# Patient Record
Sex: Female | Born: 1960
Health system: Southern US, Community
[De-identification: ages and names within clinical notes are randomized; demographics above are authoritative.]

## PROBLEM LIST (undated history)

## (undated) DIAGNOSIS — N6452 Nipple discharge: Secondary | ICD-10-CM

## (undated) DIAGNOSIS — F410 Panic disorder [episodic paroxysmal anxiety] without agoraphobia: Secondary | ICD-10-CM

## (undated) DIAGNOSIS — J309 Allergic rhinitis, unspecified: Secondary | ICD-10-CM

## (undated) DIAGNOSIS — F411 Generalized anxiety disorder: Secondary | ICD-10-CM

## (undated) DIAGNOSIS — J45909 Unspecified asthma, uncomplicated: Secondary | ICD-10-CM

## (undated) DIAGNOSIS — R590 Localized enlarged lymph nodes: Secondary | ICD-10-CM

## (undated) DIAGNOSIS — F329 Major depressive disorder, single episode, unspecified: Secondary | ICD-10-CM

## (undated) DIAGNOSIS — F3289 Other specified depressive episodes: Secondary | ICD-10-CM

## (undated) DIAGNOSIS — M5136 Other intervertebral disc degeneration, lumbar region: Secondary | ICD-10-CM

## (undated) DIAGNOSIS — M51369 Other intervertebral disc degeneration, lumbar region without mention of lumbar back pain or lower extremity pain: Secondary | ICD-10-CM

## (undated) DIAGNOSIS — M545 Low back pain, unspecified: Secondary | ICD-10-CM

## (undated) DIAGNOSIS — E538 Deficiency of other specified B group vitamins: Secondary | ICD-10-CM

## (undated) DIAGNOSIS — R1310 Dysphagia, unspecified: Secondary | ICD-10-CM

## (undated) DIAGNOSIS — K219 Gastro-esophageal reflux disease without esophagitis: Secondary | ICD-10-CM

## (undated) DIAGNOSIS — L2089 Other atopic dermatitis: Secondary | ICD-10-CM

## (undated) DIAGNOSIS — E786 Lipoprotein deficiency: Secondary | ICD-10-CM

## (undated) DIAGNOSIS — R200 Anesthesia of skin: Secondary | ICD-10-CM

## (undated) DIAGNOSIS — Z823 Family history of stroke: Secondary | ICD-10-CM

## (undated) HISTORY — DX: Gastro-esophageal reflux disease without esophagitis: K21.9

## (undated) HISTORY — DX: Generalized anxiety disorder: F41.1

## (undated) HISTORY — DX: Dysphagia, unspecified: R13.10

## (undated) HISTORY — DX: Unspecified asthma, uncomplicated: J45.909

## (undated) HISTORY — DX: Other atopic dermatitis: L20.89

## (undated) HISTORY — DX: Low back pain: M54.5

## (undated) HISTORY — DX: Deficiency of other specified B group vitamins: E53.8

## (undated) HISTORY — DX: Other intervertebral disc degeneration, lumbar region: M51.36

## (undated) HISTORY — DX: Other specified depressive episodes: F32.89

## (undated) HISTORY — DX: Major depressive disorder, single episode, unspecified: F32.9

## (undated) HISTORY — DX: Allergic rhinitis, unspecified: J30.9

## (undated) HISTORY — DX: Low back pain, unspecified: M54.50

## (undated) HISTORY — DX: Panic disorder (episodic paroxysmal anxiety): F41.0

## (undated) HISTORY — DX: Other intervertebral disc degeneration, lumbar region without mention of lumbar back pain or lower extremity pain: M51.369

---
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
## 1982-05-07 HISTORY — PX: KNEE ARTHROSCOPY: SUR90

## 1998-12-13 ENCOUNTER — Encounter: Admission: RE | Admit: 1998-12-13 | Discharge: 1998-12-30 | Payer: Self-pay | Admitting: Family Medicine

## 1998-12-22 ENCOUNTER — Other Ambulatory Visit: Admission: RE | Admit: 1998-12-22 | Discharge: 1998-12-22 | Payer: Self-pay | Admitting: Obstetrics and Gynecology

## 1999-10-10 ENCOUNTER — Encounter: Payer: Self-pay | Admitting: Family Medicine

## 1999-10-10 ENCOUNTER — Encounter: Admission: RE | Admit: 1999-10-10 | Discharge: 1999-10-10 | Payer: Self-pay | Admitting: Family Medicine

## 2000-01-16 ENCOUNTER — Other Ambulatory Visit: Admission: RE | Admit: 2000-01-16 | Discharge: 2000-01-16 | Payer: Self-pay | Admitting: Obstetrics and Gynecology

## 2001-03-07 ENCOUNTER — Other Ambulatory Visit: Admission: RE | Admit: 2001-03-07 | Discharge: 2001-03-07 | Payer: Self-pay | Admitting: Obstetrics and Gynecology

## 2001-05-23 ENCOUNTER — Encounter: Admission: RE | Admit: 2001-05-23 | Discharge: 2001-07-17 | Payer: Self-pay | Admitting: Family Medicine

## 2002-06-29 ENCOUNTER — Other Ambulatory Visit: Admission: RE | Admit: 2002-06-29 | Discharge: 2002-06-29 | Payer: Self-pay | Admitting: Obstetrics and Gynecology

## 2003-10-22 ENCOUNTER — Other Ambulatory Visit: Admission: RE | Admit: 2003-10-22 | Discharge: 2003-10-22 | Payer: Self-pay | Admitting: Obstetrics and Gynecology

## 2003-11-05 ENCOUNTER — Encounter: Admission: RE | Admit: 2003-11-05 | Discharge: 2003-11-05 | Payer: Self-pay | Admitting: Family Medicine

## 2003-11-29 ENCOUNTER — Emergency Department (HOSPITAL_COMMUNITY): Admission: EM | Admit: 2003-11-29 | Discharge: 2003-11-29 | Payer: Self-pay | Admitting: Emergency Medicine

## 2004-02-05 ENCOUNTER — Encounter: Payer: Self-pay | Admitting: Family Medicine

## 2004-02-05 LAB — CONVERTED CEMR LAB

## 2004-04-11 ENCOUNTER — Ambulatory Visit: Payer: Self-pay | Admitting: Internal Medicine

## 2004-05-19 ENCOUNTER — Ambulatory Visit: Payer: Self-pay | Admitting: Family Medicine

## 2004-06-07 HISTORY — PX: OTHER SURGICAL HISTORY: SHX169

## 2004-07-27 ENCOUNTER — Ambulatory Visit: Payer: Self-pay | Admitting: Family Medicine

## 2004-08-25 ENCOUNTER — Ambulatory Visit: Payer: Self-pay | Admitting: Family Medicine

## 2005-05-04 ENCOUNTER — Other Ambulatory Visit: Admission: RE | Admit: 2005-05-04 | Discharge: 2005-05-04 | Payer: Self-pay | Admitting: Obstetrics and Gynecology

## 2005-09-04 HISTORY — PX: OTHER SURGICAL HISTORY: SHX169

## 2005-09-14 ENCOUNTER — Encounter: Admission: RE | Admit: 2005-09-14 | Discharge: 2005-09-14 | Payer: Self-pay | Admitting: Obstetrics and Gynecology

## 2005-09-18 ENCOUNTER — Ambulatory Visit: Payer: Self-pay | Admitting: Family Medicine

## 2005-09-25 ENCOUNTER — Ambulatory Visit: Payer: Self-pay | Admitting: Family Medicine

## 2005-09-28 ENCOUNTER — Ambulatory Visit: Payer: Self-pay | Admitting: Family Medicine

## 2005-10-12 ENCOUNTER — Ambulatory Visit: Payer: Self-pay | Admitting: Family Medicine

## 2005-10-22 ENCOUNTER — Ambulatory Visit: Payer: Self-pay | Admitting: Gastroenterology

## 2005-12-05 HISTORY — PX: GANGLION CYST EXCISION: SHX1691

## 2006-12-19 ENCOUNTER — Ambulatory Visit: Payer: Self-pay | Admitting: Family Medicine

## 2006-12-19 DIAGNOSIS — L2089 Other atopic dermatitis: Secondary | ICD-10-CM | POA: Insufficient documentation

## 2007-01-08 ENCOUNTER — Encounter: Payer: Self-pay | Admitting: Family Medicine

## 2007-01-08 DIAGNOSIS — J4520 Mild intermittent asthma, uncomplicated: Secondary | ICD-10-CM | POA: Insufficient documentation

## 2007-01-08 DIAGNOSIS — F329 Major depressive disorder, single episode, unspecified: Secondary | ICD-10-CM

## 2007-01-08 DIAGNOSIS — J309 Allergic rhinitis, unspecified: Secondary | ICD-10-CM | POA: Insufficient documentation

## 2007-01-08 DIAGNOSIS — F410 Panic disorder [episodic paroxysmal anxiety] without agoraphobia: Secondary | ICD-10-CM | POA: Insufficient documentation

## 2007-01-08 DIAGNOSIS — F3342 Major depressive disorder, recurrent, in full remission: Secondary | ICD-10-CM | POA: Insufficient documentation

## 2007-01-10 ENCOUNTER — Ambulatory Visit: Payer: Self-pay | Admitting: Family Medicine

## 2007-01-10 ENCOUNTER — Encounter (INDEPENDENT_AMBULATORY_CARE_PROVIDER_SITE_OTHER): Payer: Self-pay | Admitting: *Deleted

## 2007-06-12 ENCOUNTER — Encounter: Payer: Self-pay | Admitting: Family Medicine

## 2007-06-30 ENCOUNTER — Telehealth: Payer: Self-pay | Admitting: Family Medicine

## 2007-07-18 ENCOUNTER — Encounter: Payer: Self-pay | Admitting: Family Medicine

## 2007-08-07 ENCOUNTER — Ambulatory Visit: Payer: Self-pay | Admitting: Family Medicine

## 2008-02-19 ENCOUNTER — Telehealth: Payer: Self-pay | Admitting: Family Medicine

## 2008-06-24 ENCOUNTER — Ambulatory Visit: Payer: Self-pay | Admitting: Family Medicine

## 2008-07-05 ENCOUNTER — Telehealth (INDEPENDENT_AMBULATORY_CARE_PROVIDER_SITE_OTHER): Payer: Self-pay | Admitting: *Deleted

## 2008-08-05 LAB — CONVERTED CEMR LAB: Pap Smear: NORMAL

## 2008-11-01 ENCOUNTER — Telehealth: Payer: Self-pay | Admitting: Family Medicine

## 2008-11-15 ENCOUNTER — Telehealth: Payer: Self-pay | Admitting: Family Medicine

## 2008-11-16 ENCOUNTER — Ambulatory Visit: Payer: Self-pay | Admitting: Family Medicine

## 2008-12-22 ENCOUNTER — Ambulatory Visit: Payer: Self-pay | Admitting: Family Medicine

## 2008-12-22 DIAGNOSIS — F411 Generalized anxiety disorder: Secondary | ICD-10-CM

## 2008-12-22 HISTORY — DX: Generalized anxiety disorder: F41.1

## 2008-12-22 LAB — CONVERTED CEMR LAB: Cholesterol, target level: 200 mg/dL

## 2008-12-27 LAB — CONVERTED CEMR LAB
ALT: 14 units/L (ref 0–35)
AST: 17 units/L (ref 0–37)
Alkaline Phosphatase: 76 units/L (ref 39–117)
Eosinophils Relative: 0.2 % (ref 0.0–5.0)
GFR calc non Af Amer: 70.46 mL/min (ref >60)
HCT: 39.6 % (ref 36.0–46.0)
Hemoglobin: 13.3 g/dL (ref 12.0–15.0)
LDL Cholesterol: 128 mg/dL — ABNORMAL HIGH (ref 0–99)
Lymphocytes Relative: 20.4 % (ref 12.0–46.0)
Lymphs Abs: 2.2 10*3/uL (ref 0.7–4.0)
Monocytes Relative: 6.8 % (ref 3.0–12.0)
Neutro Abs: 7.8 10*3/uL — ABNORMAL HIGH (ref 1.4–7.7)
Platelets: 487 10*3/uL — ABNORMAL HIGH (ref 150.0–400.0)
Potassium: 4.9 meq/L (ref 3.5–5.1)
Sodium: 144 meq/L (ref 135–145)
Total Bilirubin: 0.5 mg/dL (ref 0.3–1.2)
VLDL: 23.8 mg/dL (ref 0.0–40.0)
WBC: 10.7 10*3/uL — ABNORMAL HIGH (ref 4.5–10.5)

## 2009-01-07 ENCOUNTER — Ambulatory Visit: Payer: Self-pay | Admitting: Family Medicine

## 2009-04-27 ENCOUNTER — Telehealth: Payer: Self-pay | Admitting: Family Medicine

## 2009-07-08 ENCOUNTER — Telehealth: Payer: Self-pay | Admitting: Family Medicine

## 2009-07-21 ENCOUNTER — Ambulatory Visit: Payer: Self-pay | Admitting: Family Medicine

## 2009-10-17 ENCOUNTER — Ambulatory Visit: Payer: Self-pay | Admitting: Family Medicine

## 2010-01-18 ENCOUNTER — Telehealth: Payer: Self-pay | Admitting: Family Medicine

## 2010-01-19 ENCOUNTER — Ambulatory Visit: Payer: Self-pay | Admitting: Family Medicine

## 2010-01-20 LAB — CONVERTED CEMR LAB
Basophils Absolute: 0 10*3/uL (ref 0.0–0.1)
Calcium: 8.8 mg/dL (ref 8.4–10.5)
Creatinine, Ser: 0.9 mg/dL (ref 0.4–1.2)
Eosinophils Absolute: 0.2 10*3/uL (ref 0.0–0.7)
GFR calc non Af Amer: 72 mL/min (ref >60)
HCT: 34.8 % — ABNORMAL LOW (ref 36.0–46.0)
Lymphs Abs: 2.4 10*3/uL (ref 0.7–4.0)
MCHC: 33.9 g/dL (ref 30.0–36.0)
Monocytes Absolute: 0.8 10*3/uL (ref 0.1–1.0)
Monocytes Relative: 8.6 % (ref 3.0–12.0)
Neutro Abs: 6.3 10*3/uL (ref 1.4–7.7)
Platelets: 390 10*3/uL (ref 150.0–400.0)
RDW: 14.1 % (ref 11.5–14.6)
Sodium: 140 meq/L (ref 135–145)
TSH: 1.59 microintl units/mL (ref 0.35–5.50)
Vitamin B-12: 172 pg/mL — ABNORMAL LOW (ref 211–911)

## 2010-01-23 ENCOUNTER — Encounter: Payer: Self-pay | Admitting: Family Medicine

## 2010-01-24 ENCOUNTER — Ambulatory Visit: Payer: Self-pay | Admitting: Family Medicine

## 2010-01-24 DIAGNOSIS — E538 Deficiency of other specified B group vitamins: Secondary | ICD-10-CM | POA: Insufficient documentation

## 2010-03-03 ENCOUNTER — Ambulatory Visit: Payer: Self-pay | Admitting: Family Medicine

## 2010-03-07 LAB — CONVERTED CEMR LAB
Basophils Absolute: 0.1 10*3/uL (ref 0.0–0.1)
Eosinophils Relative: 2.7 % (ref 0.0–5.0)
Lymphs Abs: 2.6 10*3/uL (ref 0.7–4.0)
Monocytes Relative: 8.1 % (ref 3.0–12.0)
Neutrophils Relative %: 63.3 % (ref 43.0–77.0)
Platelets: 401 10*3/uL — ABNORMAL HIGH (ref 150.0–400.0)
RDW: 13.9 % (ref 11.5–14.6)
Vitamin B-12: 369 pg/mL (ref 211–911)
WBC: 10.4 10*3/uL (ref 4.5–10.5)

## 2010-03-29 ENCOUNTER — Ambulatory Visit: Payer: Self-pay | Admitting: Family Medicine

## 2010-03-29 DIAGNOSIS — M545 Low back pain, unspecified: Secondary | ICD-10-CM | POA: Insufficient documentation

## 2010-04-13 ENCOUNTER — Encounter: Payer: Self-pay | Admitting: Family Medicine

## 2010-04-13 ENCOUNTER — Telehealth: Payer: Self-pay | Admitting: Family Medicine

## 2010-05-23 ENCOUNTER — Telehealth: Payer: Self-pay | Admitting: Family Medicine

## 2010-06-01 ENCOUNTER — Encounter: Payer: Self-pay | Admitting: Family Medicine

## 2010-06-06 NOTE — Progress Notes (Signed)
Summary: Alternative for Paxil  Phone Note From Pharmacy Call back at [phone]   Caller: [facility]* Call For: Dr. Milinda Antis  Summary of Call: Received fax from pharmacy asking to which patient form Paxil to Prozac.  States patient does not tolerate Paxil.  Please advise. Initial call taken by: Linde Gillis CMA Duncan Dull),  July 08, 2009 11:57 AM  Follow-up for Phone Call        that is fine with me - but please verify this with her by phone first  px written on EMR for call in- prozac f/u with me in 6-8 weeks please to check in  please note what side eff she has to paxil and note on her allergy list -thanks  if prozac causes depression or makes her suddenly depressed or suicidal -- stop it and update me   Follow-up by: Judith Part MD,  July 08, 2009 1:38 PM  Additional Follow-up for Phone Call Additional follow up Details #1::        Pt did not have side effects from Paxil, she liked the way Prozac made her feel instead of Paxil. Patient notified as instructed by telephone. Pt will call back to schedule appt in 6-8 weeks,.Medication phoned to [facility] pharmacy as instructed. Lewanda Rife LPN  July 09, 863 4:37 PM     New/Updated Medications: PROZAC 20 MG CAPS (FLUOXETINE HCL) 1 by mouth once daily Prescriptions: PROZAC 20 MG CAPS (FLUOXETINE HCL) 1 by mouth once daily  #30 x 11   Entered and Authorized by:   Judith Part MD   Signed by:   Lewanda Rife LPN on 78/46/9629   Method used:   Telephoned to ...       [facility]* (retail)       [address]       [place], [place]  [address]       Ph: [phone]       Fax: [phone]   RxID:   [id number]

## 2010-06-06 NOTE — Assessment & Plan Note (Signed)
Summary: BACK PAIN / LFW   Vital Signs:  Patient profile:   50 year old female Height:      64 inches Weight:      199.75 pounds BMI:     34.41 Temp:     99.2 degrees F oral Pulse rate:   88 / minute Pulse rhythm:   regular BP sitting:   136 / 72  (left arm) Cuff size:   large  Vitals Entered By: Lewanda Rife LPN (March 29, 2010 2:26 PM) CC: Lower back pain on and off for 2 months, 4 days ago pulled muscle in back when picked up bale of hay. Pain level now is 4.   History of Present Illness: pulled a muscle in her back 2 mo ago pulling up shrubs  nursed with heat and ice and ibuprofen  got better after 2 wks-- then hurt it again twice  recently picked up a bail of hay and worse again   pain is lower R  sharp and dull  worst to pull or sit up  best to get in hot bath or in heated seats  pain does not go down leg no numb or weakness  a little better today   started trying to do some crunches to improve abd strength -- ? if it is helping  Allergies: 1)  ! 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 CV:  Denies palpitations and shortness of breath with exertion. Resp:  Denies cough, shortness of breath, and wheezing. GI:  Denies abdominal pain. GU:  Denies dysuria, hematuria, and urinary  frequency. MS:  Complains of low back pain and stiffness; denies cramps and muscle weakness. Derm:  Denies itching, lesion(s), poor wound healing, and rash. Neuro:  Denies numbness, tingling, and weakness. Endo:  Denies cold intolerance and heat intolerance. Heme:  Denies abnormal bruising and bleeding.  Physical Exam  General:  overweight but generally well appearing  Mouth:  pharynx pink and moist.   Neck:  supple with full rom and no masses or thyromegally, no JVD or carotid bruit  Lungs:  Normal respiratory effort, chest expands symmetrically. Lungs are clear to auscultation, no crackles or wheezes. Heart:  Normal rate and regular rhythm. S1 and S2 normal without gallop, murmur, click, rub or other extra sounds. Abdomen:  no suprapubic tenderness or fullness felt  Msk:  no bony spinal tenderness tender peri lumbar musculature on R with SI tenderness neg slr nl hip rotation nl gait  flex 20 deg/ ext 5 deg lateral flex worse on R Extremities:  No clubbing, cyanosis, edema, or deformity noted with normal full range of motion of all joints.   Neurologic:  strength normal in all extremities, sensation intact to light touch, gait normal, and DTRs symmetrical and normal.   Skin:  Intact without suspicious lesions or rashes Inguinal Nodes:  No significant adenopathy Psych:  normal affect, talkative and pleasant  Impression & Recommendations:  Problem # 1:  BACK PAIN, LUMBAR (ICD-724.2) Assessment New suspect muscular with weak spinal muscles and repeditive spasm from strenuous activities ref PT  has muscle relaxer add mobic as needed  heat/ stretches update if not improved Her updated medication list for this problem includes:    Mobic 15 Mg Tabs (Meloxicam) .Marland Kitchen... 1 by mouth once daily with a meal as needed back pain  stop if gi upset  Orders: Physical Therapy Referral (PT) Prescription Created Electronically [phone])  Complete Medication List: 1)  Singulair 10 Mg Tabs  (Montelukast sodium) .... Take 1 tablet by mouth once a day 2)  Valtrex 500 Mg Tabs (Valacyclovir hcl) .... Take 1 tablet by mouth once a day 3)  Multivitamins Tabs (Multiple vitamin) .... Take 1 tablet by mouth once a day 4)  Calcium, Magnesium Zinc  .... Once daily 5)  Proventil Hfa 108 (90 Base) Mcg/act Aers (Albuterol sulfate) .... 2 puffs inhaled every 4 hours as needed wheeze 6)  Flonase 50 Mcg/act Susp (Fluticasone propionate) .... 2 sprays each nostril daily 7)  Alprazolam 0.5 Mg Tabs (Alprazolam) .... 1/2 to 1 by mouth up to two times a day as needed severe anxiety 8)  Fluoxetine Hcl 10 Mg Tabs (Fluoxetine hcl) .... 2 tabs by mouth daily. 9)  Lo Loestrin Fe 1 Mg-10 Mcg / 10 Mcg Tabs (Norethin-eth estrad-fe biphas) .... As directed 10)  Cyanocobalamin 1000 Mcg/ml Soln (Cyanocobalamin) .... Inject as directed 1 ml im   once weekly for 3 weeks , then once monthly 11)  Needles and Syringes For B12 Injection  .... To inject b12 im as directed once weekly for 3 weeks and then once monthly 12)  Mobic 15 Mg Tabs (Meloxicam) .Marland Kitchen.. 1 by mouth once daily with a meal as needed back pain  stop if gi upset  Patient Instructions: 1)  stop all otc pain meds  2)  try mobic daily with food - stop if stomach upset - I sent to your pharmacy  3)  use muscle relaxer at night  4)  heat and gentle stretching  5)  we will do PT referral at check out  6)  if no further improvement after that let me know  Prescriptions: MOBIC 15 MG TABS (MELOXICAM) 1 by mouth once daily with a meal as needed back pain  stop if GI upset  #30 x 1   Entered and Authorized by:   Judith Part MD   Signed by:   Judith Part MD on 03/29/2010   Method used:   Electronically to        [facility]* (retail)       [address]       [place], [place]  [address]       Ph: [phone]       Fax: [phone]   RxID:   [id number]    Orders Added: 1)  Physical Therapy Referral [PT] 2)  Prescription Created  Electronically [G8553] 3)  Est. Patient Level III [47425]    Current Allergies (reviewed today): ! PCN

## 2010-06-06 NOTE — Assessment & Plan Note (Signed)
Summary: FOLLOW UP, DISCUSS LABS   Vital Signs:  Patient profile:   50 year old female Height:      64 inches Weight:      196.50 pounds BMI:     33.85 Temp:     98.9 degrees F oral Pulse rate:   88 / minute Pulse rhythm:   regular BP sitting:   118 / 68  (left arm) Cuff size:   large  Vitals Entered By: Lewanda Rife LPN (January 24, 2010 8:12 AM) CC: follow-up visit to discuss labs   History of Present Illness: here for f/u of fatigue  is difficult to exercise due to this  stress level is very high in general  can sleep all day on the weekend   her gyn is putting her on a new pill even though she is possibly menopausal  stays hot all the time    wt is up 4 lb  bp is 118/68  hb is slt low at 11.8 -- was 13.3 last check B12 is low at 172 never had this before   other labs are nl   has nexium - hardly ever takes it  no hx of stomach problems  no fam hx of low B12 that she knows of   eats a pretty balanced diet / not a lot of red meat   has not had a period in 5 months   takes vit that has B12     Allergies: 1)  ! 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 Eyes:  Denies blurring and eye irritation. CV:  Denies chest pain or discomfort, palpitations, and  shortness of breath with exertion. Resp:  Denies cough, shortness of breath, and wheezing. GI:  Denies abdominal pain, change in bowel habits, and indigestion. MS:  Denies muscle aches and cramps. Derm:  Denies itching, lesion(s), poor wound healing, and rash. Neuro:  Denies numbness and tingling. Psych:  Complains of irritability; denies anxiety and depression. Endo:  Complains of heat intolerance; denies cold intolerance, excessive thirst, and excessive urination. Heme:  Denies abnormal bruising and bleeding.  Physical Exam  General:  Well-developed,well-nourished,in no acute distress; alert,appropriate and cooperative throughout examination Head:  normocephalic, atraumatic, and no abnormalities observed.  no sinus tenderness Eyes:  vision grossly intact, pupils equal, pupils round, and pupils reactive to light.   Mouth:  pharynx pink and moist.   Neck:  supple with full rom and no masses or thyromegally, no JVD or carotid bruit  Lungs:  Normal respiratory effort, chest expands symmetrically. Lungs are clear to auscultation, no crackles or wheezes. Heart:  Normal rate and regular rhythm. S1 and S2 normal without gallop, murmur, click, rub or other extra sounds. Abdomen:  Bowel sounds positive,abdomen soft and non-tender without masses, organomegaly or hernias noted. no renal bruits  Msk:  No deformity or scoliosis noted of thoracic or lumbar spine.  Extremities:  No clubbing, cyanosis, edema, or deformity noted with normal full range of motion of all joints.   Neurologic:  sensation intact to light touch, gait normal, and DTRs symmetrical and normal.   Skin:  Intact without suspicious lesions or rashes no pallor  Cervical Nodes:  No lymphadenopathy noted Inguinal Nodes:  No significant adenopathy Psych:  normal affect, talkative and pleasant    Impression & Recommendations:  Problem # 1:  VITAMIN B12 DEFICIENCY (ICD-266.2) Assessment New  new dx also with slt low hb  will start  one B12 shot weekly for 4 weeks and then one monthly disc balanced diet  re check lab in 5 weeks  Orders: Admin of Therapeutic Inj  intramuscular or subcutaneous (04540) Vit B12 1000 mcg (J3420)  Problem # 2:  FATIGUE (ICD-780.79) Assessment: New suspect from combination of above plus stress and early menopause disc this in detail recommend healthy diet and exercise reviewed labs in detail with pt today disc expectations for menopause  Complete Medication List: 1)  Singulair 10 Mg Tabs (Montelukast sodium) .... Take 1 tablet by mouth once a day 2)  Valtrex 500 Mg Tabs (Valacyclovir hcl) .... Take 1 tablet by mouth once a day 3)  Multivitamins Tabs (Multiple vitamin) .... Take 1 tablet by mouth once a day 4)  Calcium, Magnesium Zinc  .... Once daily 5)  Proventil Hfa 108 (90 Base) Mcg/act Aers (Albuterol sulfate) .... 2 puffs inhaled every 4 hours as needed wheeze 6)  Flonase 50 Mcg/act Susp (Fluticasone propionate) .... 2 sprays each nostril daily 7)  Alprazolam 0.5 Mg Tabs (Alprazolam) .... 1/2 to 1 by mouth up to two times a day as needed severe anxiety 8)  Fluoxetine Hcl 10 Mg Tabs (Fluoxetine hcl) .... 2 tabs by mouth daily. 9)  Lo Loestrin Fe 1 Mg-10 Mcg / 10 Mcg Tabs (Norethin-eth estrad-fe biphas) .... As directed 10)  Cyanocobalamin 1000 Mcg/ml Soln (Cyanocobalamin) .... Inject as directed 1 ml im   once weekly for 3 weeks , then once monthly 11)  Needles and Syringes For B12 Injection  .... To inject b12 im as directed once weekly for 3 weeks and then once monthly  Patient Instructions: 1)  start B12 - one shot once weekly for 4 weeks  2)  first one today  3)  then take one shot per month  4)  schedule non fasting lab in 5 weeks -- cbc with diff and B12 level for anemia and fatigue  Prescriptions: CYANOCOBALAMIN 1000 MCG/ML SOLN (CYANOCOBALAMIN) inject as directed 1 ML IM   once weekly for 3 weeks , then once monthly  #4 ML x 11   Entered and Authorized by:   Judith Part MD   Signed by:   Judith Part MD on 01/24/2010   Method used:   Print then Give to Patient   RxID:   [id number] NEEDLES AND SYRINGES FOR B12 INJECTION to inject B12 IM as directed once weekly for 3 weeks and then once monthly  #4 x 11   Entered and Authorized by:   Judith Part MD   Signed by:   Judith Part MD on 01/24/2010   Method used:   Print then Give to Patient   RxID:   [id number]   Current Allergies (reviewed today): ! PCN   Medication Administration  Injection # 1:    Medication: Vit B12 1000 mcg    Diagnosis: VITAMIN B12 DEFICIENCY (ICD-266.2)    Route: IM  Site: L deltoid    Exp Date: 08/06/2011    Lot #: [id number]    Mfr: [employer] Regent    Patient tolerated injection without complications    Given by: Lewanda Rife LPN (January 24, 2010 11:32 AM)  Orders Added: 1)  Admin of Therapeutic Inj  intramuscular or subcutaneous [96372] 2)  Vit B12 1000 mcg [J3420] 3)  Est. Patient Level IV [16109]

## 2010-06-06 NOTE — Progress Notes (Signed)
Summary: regarding B12  Phone Note Call from Patient Call back at Home Phone [phone]   Caller: Patient Call For: Judith Part MD Summary of Call: Pt is asking if she can have her B12 shots every 3 weeks instead of 4.  She says she feels very tired and doesnt think that monthy injections will be enough for her.  She gave herself one yesterday, which was a week early. Initial call taken by: Lowella Petties CMA, AAMA,  April 13, 2010 11:36 AM  Follow-up for Phone Call        that is fine change to every 3 weeks and note it on med list Follow-up by: Judith Part MD,  April 13, 2010 1:07 PM  Additional Follow-up for Phone Call Additional follow up Details #1::        Patient notified as instructed by telephone. Med list updated.Lewanda Rife LPN  April 13, 2010 3:11 PM     +

## 2010-06-06 NOTE — Assessment & Plan Note (Signed)
Summary: EAR/CLE   Vital Signs:  Patient profile:   50 year old female Height:      64 inches Weight:      192.50 pounds BMI:     33.16 Temp:     99 degrees F oral Pulse rate:   84 / minute Pulse rhythm:   regular BP sitting:   122 / 74  (left arm) Cuff size:   large  Vitals Entered By: Delilah Shan CMA Duncan Dull) (July 21, 2009 10:07 AM) CC: Ear.  Needs refills on some meds:  Proventil, Flonase and needs Prozac tabs ( not capsules)   History of Present Illness: 50 yo here for ears popping. Had a URI a couple of weeks ago, symptoms of cough, nasal congestion have all resolved but still having bilateral ear pressure/popping. No pain, no fevers, no chills.  Had vertigo last week, symptoms have resolved.  Current Medications (verified): 1)  Singulair 10 Mg  Tabs (Montelukast Sodium) .... Take 1 Tablet By Mouth Once A Day 2)  Valtrex 500 Mg  Tabs (Valacyclovir Hcl) .... Take 1 Tablet By Mouth Once A Day 3)  Multivitamins   Tabs (Multiple Vitamin) .... Take 1 Tablet By Mouth Once A Day 4)  Calcium, Magnesium Zinc .... Once Daily 5)  Vitamin E 600 Unit  Caps (Vitamin E) .... Take 1 Capsule By Mouth Once A Day 6)  Proventil Hfa 108 (90 Base) Mcg/act  Aers (Albuterol Sulfate) .... 2 Puffs Inhaled Every 4 Hours As Needed Wheeze 7)  Flonase 50 Mcg/act Susp (Fluticasone Propionate) .... 2 Sprays Each Nostril Daily 8)  Yaz 3-0.02 Mg Tabs (Drospirenone-Ethinyl Estradiol) .... As Directed 9)  Alprazolam 0.5 Mg Tabs (Alprazolam) .... 1/2 To 1 By Mouth Up To Two Times A Day As Needed Severe Anxiety 10)  Fluoxetine Hcl 10 Mg Tabs (Fluoxetine Hcl) .... 2 Tabs By Mouth Daily.  Allergies: 1)  ! Pcn  Review of Systems      See HPI General:  Denies chills, fatigue, and fever. Eyes:  Denies blurring. ENT:  Complains of earache; denies ear discharge and sinus pressure. CV:  Denies chest pain or discomfort. Resp:  Denies cough.  Physical Exam  General:  overweight but generally well  appearing  Ears:  mod clear fluid TMs bilaterallhy Nose:  no nasal discharge.   Mouth:  pharynx pink and moist.   Lungs:  Normal respiratory effort, chest expands symmetrically. Lungs are clear to auscultation, no crackles or wheezes. Heart:  Normal rate and regular rhythm. S1 and S2 normal without gallop, murmur, click, rub or other extra sounds. Extremities:  No clubbing, cyanosis, edema, or deformity noted with normal full range of motion of all joints.   Psych:  normal affect, talkative and pleasant    Impression & Recommendations:  Problem # 1:  URI (ICD-465.9) Assessment New Resolving.  Advised trying Sudafed for pressure in ears. Continue NSAIDs as needed. RTC if no improvement 5-7 days.  Complete Medication List: 1)  Singulair 10 Mg Tabs (Montelukast sodium) .... Take 1 tablet by mouth once a day 2)  Valtrex 500 Mg Tabs (Valacyclovir hcl) .... Take 1 tablet by mouth once a day 3)  Multivitamins Tabs (Multiple vitamin) .... Take 1 tablet by mouth once a day 4)  Calcium, Magnesium Zinc  .... Once daily 5)  Vitamin E 600 Unit Caps (Vitamin e) .... Take 1 capsule by mouth once a day 6)  Proventil Hfa 108 (90 Base) Mcg/act Aers (Albuterol sulfate) .... 2 puffs inhaled every  4 hours as needed wheeze 7)  Flonase 50 Mcg/act Susp (Fluticasone propionate) .... 2 sprays each nostril daily 8)  Yaz 3-0.02 Mg Tabs (Drospirenone-ethinyl estradiol) .... As directed 9)  Alprazolam 0.5 Mg Tabs (Alprazolam) .... 1/2 to 1 by mouth up to two times a day as needed severe anxiety 10)  Fluoxetine Hcl 10 Mg Tabs (Fluoxetine hcl) .... 2 tabs by mouth daily. Prescriptions: PROVENTIL HFA 108 (90 BASE) MCG/ACT  AERS (ALBUTEROL SULFATE) 2 puffs inhaled every 4 hours as needed wheeze  #1 x 0   Entered and Authorized by:   Ruthe Mannan MD   Signed by:   Ruthe Mannan MD on 07/21/2009   Method used:   Electronically to        [facility]* (retail)       [address]       [place], [place]  [address]        Ph: [phone]       Fax: [phone]   RxID:   [id number] FLUOXETINE HCL 10 MG TABS (FLUOXETINE HCL) 2 tabs by mouth daily.  #60 x 3   Entered and Authorized by:   Ruthe Mannan MD   Signed by:   Ruthe Mannan MD on 07/21/2009   Method used:   Electronically to        [facility]* (retail)       [address]       [place], [place]  [address]       Ph: [phone]       Fax: [phone]   RxID:   [id number] FLONASE 50 MCG/ACT SUSP (FLUTICASONE PROPIONATE) 2 sprays each nostril daily  #1 x 6   Entered and Authorized by:   Ruthe Mannan MD   Signed by:   Ruthe Mannan MD on 07/21/2009   Method used:   Electronically to        [facility]* (retail)       [address]       [place], [place]  [address]       Ph: [phone]       Fax: [phone]   RxID:   [id number]   Current Allergies (reviewed today): ! PCN

## 2010-06-06 NOTE — Miscellaneous (Signed)
Summary: Substances Contract  Substances Contract   Imported By: Lester Ignacio 02/02/2010 10:23:57  _____________________________________________________________________  External Attachment:    Type:   Image     Comment:   External Document

## 2010-06-06 NOTE — Miscellaneous (Signed)
Summary: Evaluation and Orders/[facility]  Evaluation and Orders/[facility]   Imported By: Lester Rush 04/13/2010 10:53:50  _____________________________________________________________________  External Attachment:    Type:   Image     Comment:   External Document

## 2010-06-06 NOTE — Assessment & Plan Note (Signed)
Summary: EAR PAIN/DLO   Vital Signs:  Patient profile:   50 year old female Height:      64 inches Weight:      192.75 pounds BMI:     33.21 Temp:     98.8 degrees F oral Pulse rate:   84 / minute Pulse rhythm:   regular BP sitting:   110 / 70  (left arm) Cuff size:   large  Vitals Entered By: Lewanda Rife LPN (October 17, 2009 11:46 AM) CC: Rt ear pain still and rt wrist is bothering her   History of Present Illness: Rt ear pain -- came in and saw Dr Dayton Martes -- was inst to  take sudafed and she tried that  is on flonase and singulair for all  feels better when she taking sudafed -- but she does not want to take it forever some pressure feeling -- with popping , not really painful   does use flonase just one spray per nostril  R wrist had a knot in it - on inside and she ruptured? it -- getting out of tub - pushed to get up and forcibly extending wrist  does not hurt when still , but pain when she ext it    needs hydrocortisone cream as needed for poison ivy- gets it a lot  recently got on R arm after mowing some spots are still lower on arm - drying out  more itching over upper arm and elbow crease no pus or signs of infx    Allergies: 1)  ! Pcn  Past History:  Past Medical History: Last updated: 01/07/2009 Allergic rhinitis Asthma Depression   gyn- Dr Henderson Cloud   Past Surgical History: Last updated: 01/08/2007 arthroscopic knee surgry- evulsion of femur s/ p fall (1984) Caesarean section Dexa- (06/2004) Abd [place]- neg (09/2005) Ganglion cyst- knee (12/2005)  Family History: Last updated: 01/07/2009 Father: mild HTN, ulcerative colitis Mother:  Siblings:   Social History: Last updated: 01/07/2009 Marital Status: Married Children: 2 sons Occupation: Sales executive non smoker  does exercise  rare alcohol   Risk Factors: Smoking Status: never (01/08/2007)  Review of Systems General:  Denies chills, fatigue, fever, and loss of appetite. Eyes:   Complains of eye irritation. ENT:  Complains of postnasal drainage; denies decreased hearing, ear discharge, ringing in ears, and sinus pressure. CV:  Denies chest pain or discomfort and palpitations. Resp:  Denies cough, shortness of breath, and wheezing. GI:  Denies indigestion and nausea. Derm:  Complains of itching and rash; denies lesion(s) and poor wound healing. Neuro:  Denies headaches.  Physical Exam  General:  Well-developed,well-nourished,in no acute distress; alert,appropriate and cooperative throughout examination Head:  normocephalic, atraumatic, and no abnormalities observed.  no sinus tenderness Eyes:  vision grossly intact, pupils equal, pupils round, pupils reactive to light, and no injection.   Ears:  R ear normal and L ear normal.  - no eff today Nose:  nares are mildly congested Mouth:  pharynx pink and moist, no erythema, and no exudates.   Neck:  supple with full rom and no masses or thyromegally, no JVD or carotid bruit  Lungs:  Normal respiratory effort, chest expands symmetrically. Lungs are clear to auscultation, no crackles or wheezes. Heart:  Normal rate and regular rhythm. S1 and S2 normal without gallop, murmur, click, rub or other extra sounds. Msk:  tender over medial R wrist -- no lump felt  nl rom - pain on full extension nl perf and sensation nl grip and strength  Extremities:  No clubbing, cyanosis, edema, or deformity noted with normal full range of motion of all joints.   Neurologic:  sensation intact to light touch, gait normal, and DTRs symmetrical and normal.   Skin:  healed poison ivy vesicles - diffusely on R arm  Cervical Nodes:  No lymphadenopathy noted Psych:  normal affect, talkative and pleasant    Impression & Recommendations:  Problem # 1:  WRIST PAIN, RIGHT (ICD-719.43) Assessment New suspect pt self-ruptured a ganglion cyst in wrist - now is sore  recommend ice two times a day and soft splint for support update if not imp in 2  weeks   Problem # 2:  EAR PAIN, RIGHT (ICD-388.70) Assessment: Deteriorated suspect this is allergy and ET related - though better today adv to inc flonase to 2 sprays per nostril and update  sudafed as needed  if not imp consider ENt ref   Problem # 3:  DERMATITIS, OTHER ATOPIC (ICD-691.8)  recurrent poison ivy refil elocon disc avoidance update if not imp Her updated medication list for this problem includes:    Elocon 0.1 % Crea (Mometasone furoate) .Marland Kitchen... Apply to affected areas (poison ivy) once daily as needed  Orders: Prescription Created Electronically [id number])  Complete Medication List: 1)  Singulair 10 Mg Tabs (Montelukast sodium) .... Take 1 tablet by mouth once a day 2)  Valtrex 500 Mg Tabs (Valacyclovir hcl) .... Take 1 tablet by mouth once a day 3)  Multivitamins Tabs (Multiple vitamin) .... Take 1 tablet by mouth once a day 4)  Calcium, Magnesium Zinc  .... Once daily 5)  Vitamin E 600 Unit Caps (Vitamin e) .... Take 1 capsule by mouth once a day 6)  Proventil Hfa 108 (90 Base) Mcg/act Aers (Albuterol sulfate) .... 2 puffs inhaled every 4 hours as needed wheeze 7)  Flonase 50 Mcg/act Susp (Fluticasone propionate) .... 2 sprays each nostril daily 8)  Yaz 3-0.02 Mg Tabs (Drospirenone-ethinyl estradiol) .... As directed 9)  Alprazolam 0.5 Mg Tabs (Alprazolam) .... 1/2 to 1 by mouth up to two times a day as needed severe anxiety 10)  Fluoxetine Hcl 10 Mg Tabs (Fluoxetine hcl) .... 2 tabs by mouth daily. 11)  Sudafed 30 Mg Tabs (Pseudoephedrine hcl) .... Otc as directed. 12)  Elocon 0.1 % Crea (Mometasone furoate) .... Apply to affected areas (poison ivy) once daily as needed  Patient Instructions: 1)  increase your flonase to 2 sprays in each nostril once daily  2)  (for first few days can do it two times a day )  3)  sudafed as needed  4)  if not improved in 2 weeks - let me know  5)  use ice on wrist two times a day for 5 minutes  6)  use a soft wrist splint over  the counter for about 2 weeks also  7)  will refil your cortisone cream for poison ivy - I [place] [facility]  Prescriptions: ELOCON 0.1 % CREA (MOMETASONE FUROATE) apply to affected areas (poison ivy) once daily as needed  #1 medium x 1   Entered and Authorized by:   Judith Part MD   Signed by:   Judith Part MD on 10/17/2009   Method used:   Electronically to        [facility]* (retail)       [address]       [place], [place]  [address]       Ph: [phone]  Fax: [phone]   RxID:   [id number]   Current Allergies (reviewed today): ! PCN

## 2010-06-06 NOTE — Miscellaneous (Signed)
Summary: med list update Cyanocobalamin 1000 mcg  Clinical Lists Changes  Medications: Changed medication from CYANOCOBALAMIN 1000 MCG/ML SOLN (CYANOCOBALAMIN) inject as directed 1 ML IM   once weekly for 3 weeks , then once monthly to CYANOCOBALAMIN 1000 MCG/ML SOLN (CYANOCOBALAMIN) inject as directed 1 ML IM   once every 3 weeks.     Current Allergies: ! PCN

## 2010-06-06 NOTE — Progress Notes (Signed)
Summary: pt wants labwork  Phone Note Call from Patient Call back at Home Phone [phone]   Caller: Patient Call For: Judith Part MD Summary of Call: Pt is requesting to have blood work done, she is concerned about anemia due to fatigue. Initial call taken by: Lowella Petties CMA,  January 18, 2010 11:55 AM  Follow-up for Phone Call        if she is fatigued I would also like to check some more labs incl sugar/ thyroid / B12 if agreeable sched lab for fatigue - cbc with diff/ B12/ folate/ tsh/ bmp and then f/u to discuss and review symptoms Follow-up by: Judith Part MD,  January 18, 2010 12:13 PM  Additional Follow-up for Phone Call Additional follow up Details #1::        Left message for patient to call back. Lewanda Rife LPN  January 18, 2010 2:28 PM   Lab appt scheduled for tomorrow.       Lowella Petties CMA  January 18, 2010 2:40 PM

## 2010-06-08 NOTE — Progress Notes (Signed)
Summary: refill request for B12  Phone Note Refill Request Message from:  Fax from Pharmacy  Refills Requested: Medication #1:  CYANOCOBALAMIN 1000 MCG/ML SOLN inject as directed 1 ML IM   once every 3 weeks.   Last Refilled: 07/05/2008 Faxed request from [facility].  Initial call taken by: Lowella Petties CMA, AAMA,  May 23, 2010 9:44 AM  Follow-up for Phone Call        px written on EMR for call in  Follow-up by: Judith Part MD,  May 23, 2010 11:25 AM    Prescriptions: CYANOCOBALAMIN 1000 MCG/ML SOLN (CYANOCOBALAMIN) inject as directed 1 ML IM   once every 3 weeks.  #3 months x 3   Entered by:   Benny Lennert CMA (AAMA)   Authorized by:   Judith Part MD   Signed by:   Benny Lennert CMA (AAMA) on 05/23/2010   Method used:   Electronically to        [facility]* (retail)       [address]       [place], [place]  [address]       Ph: [phone]       Fax: [phone]   RxID:   [id number]

## 2010-06-20 ENCOUNTER — Telehealth: Payer: Self-pay | Admitting: Family Medicine

## 2010-06-22 NOTE — Letter (Signed)
Summary: Vanessa Harvey Progress note  Vanessa Harvey Progress note   Imported By: Kassie Mends 06/09/2010 10:54:26  _____________________________________________________________________  External Attachment:    Type:   Image     Comment:   External Document

## 2010-06-23 ENCOUNTER — Encounter: Payer: Self-pay | Admitting: Family Medicine

## 2010-06-28 NOTE — Progress Notes (Signed)
Summary: Rx Flexeril  Phone Note Refill Request Call back at Home Phone [phone] Message from:  Fax from Pharmacy  Refills Requested: Medication #1:  flexeril 10 mg   Last Refilled: 07/05/2008 Faxed request from [facility].  This is not on med list. Patient states that this is for back pain that she came in to see you about. Patient states that she has been having PT. Patient states that she has been to see Dr. Margaretha Sheffield at [facility] and she may have a herniated disk. She is to see Dr. Margaretha Sheffield again this Friday and needs to have something to relieve the muscles from tightning up on her.  Initial call taken by: Sydell Axon LPN,  June 20, 2010 1:37 PM  Follow-up for Phone Call        px written on EMR for call in that is fine caution of sedation Follow-up by: Judith Part MD,  June 20, 2010 2:02 PM  Additional Follow-up for Phone Call Additional follow up Details #1::        Medication phoned to [facility]  pharmacy as instructed. Patient notified as instructed by telephone. Lewanda Rife LPN  June 20, 2010 3:22 PM     New/Updated Medications: FLEXERIL 10 MG TABS (CYCLOBENZAPRINE HCL) 1 by mouth up to three times a day as needed back pain  caution of sedation Prescriptions: FLEXERIL 10 MG TABS (CYCLOBENZAPRINE HCL) 1 by mouth up to three times a day as needed back pain  caution of sedation  #30 x 1   Entered and Authorized by:   Judith Part MD   Signed by:   Judith Part MD on 06/20/2010   Method used:   Telephoned to ...       [facility]* (retail)       [address]       [place], [place]  [address]       Ph: [phone]       Fax: [phone]   RxID:   [id number]

## 2010-07-12 ENCOUNTER — Other Ambulatory Visit: Payer: Self-pay | Admitting: Sports Medicine

## 2010-07-12 DIAGNOSIS — M545 Low back pain, unspecified: Secondary | ICD-10-CM

## 2010-07-13 ENCOUNTER — Ambulatory Visit
Admission: RE | Admit: 2010-07-13 | Discharge: 2010-07-13 | Disposition: A | Discharge status: Home / Self Care | Payer: 59 | Source: Ambulatory Visit | Attending: Sports Medicine | Admitting: Sports Medicine

## 2010-07-13 DIAGNOSIS — M545 Low back pain, unspecified: Secondary | ICD-10-CM

## 2010-07-13 NOTE — Letter (Signed)
Summary: [facility]  [facility]   Imported By: Sherian Rein 07/03/2010 10:23:52  _____________________________________________________________________  External Attachment:    Type:   Image     Comment:   External Document

## 2010-07-14 ENCOUNTER — Encounter: Payer: Self-pay | Admitting: Family Medicine

## 2010-07-25 NOTE — Letter (Signed)
Summary: [facility]  [facility]   Imported By: Lanelle Bal 07/20/2010 13:57:02  _____________________________________________________________________  External Attachment:    Type:   Image     Comment:   External Document

## 2010-08-18 ENCOUNTER — Other Ambulatory Visit: Payer: Self-pay | Admitting: Family Medicine

## 2010-08-22 ENCOUNTER — Other Ambulatory Visit: Payer: Self-pay | Admitting: *Deleted

## 2010-08-22 MED ORDER — MELOXICAM 15 MG PO TABS: ORAL_TABLET | ORAL | Status: DC

## 2010-08-22 NOTE — Telephone Encounter (Signed)
Medication phoned to [facility] pharmacy as instructed.

## 2010-08-22 NOTE — Telephone Encounter (Signed)
Px written for call in   

## 2010-09-07 ENCOUNTER — Encounter: Payer: Self-pay | Admitting: Family Medicine

## 2010-09-08 ENCOUNTER — Encounter: Payer: Self-pay | Admitting: Family Medicine

## 2010-09-08 ENCOUNTER — Ambulatory Visit (INDEPENDENT_AMBULATORY_CARE_PROVIDER_SITE_OTHER): Payer: 59 | Admitting: Family Medicine

## 2010-09-08 DIAGNOSIS — K219 Gastro-esophageal reflux disease without esophagitis: Secondary | ICD-10-CM | POA: Insufficient documentation

## 2010-09-08 DIAGNOSIS — M545 Low back pain, unspecified: Secondary | ICD-10-CM

## 2010-09-08 DIAGNOSIS — R1310 Dysphagia, unspecified: Secondary | ICD-10-CM | POA: Insufficient documentation

## 2010-09-08 MED ORDER — CYCLOBENZAPRINE HCL 10 MG PO TABS: 10.0000 mg | ORAL_TABLET | Freq: Three times a day (TID) | ORAL | Status: DC | PRN

## 2010-09-08 MED ORDER — FLUTICASONE PROPIONATE 50 MCG/ACT NA SUSP: 2.0000 | Freq: Every day | NASAL | Status: DC

## 2010-09-08 MED ORDER — ESOMEPRAZOLE MAGNESIUM 40 MG PO CPDR: 40.0000 mg | DELAYED_RELEASE_CAPSULE | Freq: Every day | ORAL | Status: DC

## 2010-09-08 NOTE — Patient Instructions (Signed)
Stop all anti inflammatories Call Dr Margaretha Sheffield if your back pain gets worse  Continue PT exercises at home Avoid spicy food/ acidic food/ alcohol/ caffiene  Start nexium  If your swallowing problem is not better in 2weeks - please call  Follow up with me in 3 months

## 2010-09-08 NOTE — Progress Notes (Signed)
Subjective:    Patient ID: Vanessa Harvey, female    DOB: 1961-04-13, 50 y.o.   MRN: [id number]  HPI Here for problems swallowing pills When she takes capsules - gets stuck in her esophagus   In past took nexium for Margorie John that years ago and it worked well   She started some nexium (very old ) and also aloe over the counter Father has esophageal cancer   At night if she lies on her R side -- acid always comes up  Never really had heartburn   Also on diclofenac for 3 months --twice daily  For back pain - deg disc dz --in PT for that  [facility] will be able to get off that soon  She takes muscle relaxer at night  Hands feel swollen- ? From nsaid    Stress is also an issue  This make her more acidic as well    Past Medical History  Diagnosis Date  . Allergic rhinitis, cause unspecified   . Anxiety state, unspecified   . Unspecified asthma   . Lumbago   . Depressive disorder, not elsewhere classified   . Other atopic dermatitis and related conditions   . Panic disorder without agoraphobia   . Other B-complex deficiencies   . Degenerative disc disease, lumbar     Past Surgical History  Procedure Date  . Knee arthroscopy 1984    evulsion of femur s/p fall  . Cesarean section   . Dexa 06/2004  . Abd [place] 09/2005    Negative  . Ganglion cyst excision 12/2005    knee    History   Social History  . Marital Status: Married    Spouse Name: N/A    Number of Children: 2  . Years of Education: N/A   Occupational History  . Dental Assistant    Social History Main Topics  . Smoking status: Never Smoker   . Smokeless tobacco: Not on file  . Alcohol Use: Yes     Rare  . Drug Use: Not on file  . Sexually Active: Not on file   Other Topics Concern  . Not on file   Social History Narrative   Exercise-yes     Wt is up 8 lb       Review of Systems Review of Systems  Constitutional: Negative for fever, appetite change, unexpected weight change. , pos for  fatigue  Eyes: Negative for pain and visual disturbance.  Respiratory: Negative for cough and shortness of breath.   Cardiovascular: Negative.  - no cp or sob Gastrointestinal: Negative for nausea, diarrhea and constipation. pos for occas epigastric pain  Genitourinary: Negative for urgency and frequency.  Skin: Negative for pallor.  Neurological: Negative for weakness, light-headedness, numbness and headaches.  Hematological: Negative for adenopathy. Does not bruise/bleed easily.  Psychiatric/Behavioral: Negative for dysphoric mood. The patient is not nervous/anxious.  -- but much stress        Objective:   Physical Exam  Constitutional: She appears well-developed and well-nourished. No distress.       overwt and well appearing   HENT:  Head: Normocephalic and atraumatic.  Mouth/Throat: Oropharynx is clear and moist.  Eyes: Conjunctivae and EOM are normal. Pupils are equal, round, and reactive to light.  Neck: Normal range of motion. Neck supple. No JVD present. No thyromegaly present.  Cardiovascular: Normal rate, regular rhythm and normal heart sounds.   Pulmonary/Chest: Breath sounds normal. She has no wheezes.  Abdominal: Soft. Bowel sounds are  normal. She exhibits no distension and no mass. There is tenderness. There is no rebound and no guarding.       Mild epigastric tenderness   Musculoskeletal: She exhibits no edema and no tenderness.  Lymphadenopathy:    She has no cervical adenopathy.  Neurological: She is alert. She has normal reflexes.  Skin: Skin is warm and dry. No pallor.  Psychiatric: She has a normal mood and affect.          Assessment & Plan:

## 2010-09-08 NOTE — Assessment & Plan Note (Signed)
With deg lumbar disc Has to stop nsaid due to GI issues- she will need to let ortho know if worse ? Candidate for epid injection Urged to continue PT exercises and wt loss

## 2010-09-08 NOTE — Assessment & Plan Note (Signed)
Suspect from worsened gerd  Wt gain/ stress/ and nsaids may play a role  Will stop diclofenac  Start nexium  Diet disc in detail  Update in 2 wk if not imp needs ref for EGD F/u 3 mo

## 2010-09-08 NOTE — Assessment & Plan Note (Signed)
See eval for dysphagia Start nexium Stop nsaid Diet disc  F/u 3 mo

## 2010-09-12 ENCOUNTER — Telehealth: Payer: Self-pay | Admitting: *Deleted

## 2010-09-12 NOTE — Telephone Encounter (Signed)
Looks like her ins does no want to cover nexium - please ask her if she has tried Risk analyst, zegerid, protonix, aciphex, kapidex or dexilant and let me know (and if they did not work or side effects)  I will hold form

## 2010-09-12 NOTE — Telephone Encounter (Signed)
Prior Berkley Harvey is needed for omeprazole, form is on your shelf.

## 2010-09-12 NOTE — Telephone Encounter (Signed)
Patient notified as instructed by telephone. Pt said this is strange because unless her husband dropped off her prescriptions she has not taken prescriptions to pharmacy yet. Pt said when she gets home today she will ck to see if rxs are still there and call our office back. Pt said she has tried 2 or 3 different meds for this problem but does not remember the names or why she stopped taking.

## 2010-09-13 MED ORDER — PANTOPRAZOLE SODIUM 40 MG PO TBEC: 40.0000 mg | DELAYED_RELEASE_TABLET | Freq: Every day | ORAL | Status: DC

## 2010-09-13 NOTE — Telephone Encounter (Signed)
Patient notified as instructed by telephone. Medication phoned to [facility] pharmacy as instructed. Completed form faxed to [phone]. Copy of form at my desk if needed and form given to [facility].

## 2010-09-13 NOTE — Telephone Encounter (Signed)
Pt said she checked and her prescriptions are still at home so not sure why requesting prior auth. Pt said still cannot remember name of previous meds taken.

## 2010-09-13 NOTE — Telephone Encounter (Signed)
Will have to try something different then- lets try protonix Px written for call in   Let me know if not imp

## 2010-09-20 ENCOUNTER — Ambulatory Visit: Payer: 59 | Admitting: Family Medicine

## 2010-09-30 ENCOUNTER — Other Ambulatory Visit: Payer: Self-pay | Admitting: Family Medicine

## 2010-10-02 NOTE — Telephone Encounter (Signed)
Sent electronically 

## 2010-12-08 ENCOUNTER — Ambulatory Visit: Payer: 59 | Admitting: Family Medicine

## 2010-12-18 ENCOUNTER — Other Ambulatory Visit: Payer: Self-pay | Admitting: Family Medicine

## 2010-12-19 NOTE — Telephone Encounter (Signed)
Will refill electronically  

## 2010-12-19 NOTE — Telephone Encounter (Signed)
[facility] in [place] electronically request refill Cyanocobalamin 1000 mcg/ml.Please advise.

## 2011-01-16 ENCOUNTER — Telehealth: Payer: Self-pay | Admitting: *Deleted

## 2011-01-16 NOTE — Telephone Encounter (Signed)
Please ask pt if she wants [place] to try her on something else until I can re do the prior auth in nov and let me know  thanks

## 2011-01-16 NOTE — Telephone Encounter (Signed)
Fax from [facility] in [place] stating that nexium needs prior auth.  This was denied back in may and pt was changed to protonix, but pharmacy had written on fax that protonix doesn't help.  I called [employer] and was told that since it has been less than 6 months since last prior auth review an appeal would have to be made.  Pt is eligible for another review after 11/9.  Do you want to try her on something else until then.  I gave pharmacy all of this information and they are to let the pt know what is going on.

## 2011-01-17 NOTE — Telephone Encounter (Signed)
Left vm for pt to callback 

## 2011-01-18 NOTE — Telephone Encounter (Signed)
Left vm for pt to callback 

## 2011-01-25 NOTE — Telephone Encounter (Signed)
Patient notified as instructed by telephone. Pt is taking OTC med now and wants to wait until Nov to try for the Nexium. Pt will call back in Nov to ck on prior auth.

## 2011-03-16 ENCOUNTER — Ambulatory Visit (INDEPENDENT_AMBULATORY_CARE_PROVIDER_SITE_OTHER): Payer: 59 | Admitting: Family Medicine

## 2011-03-16 ENCOUNTER — Encounter: Payer: Self-pay | Admitting: Family Medicine

## 2011-03-16 VITALS — BP 128/74 | HR 76 | Temp 98.1°F | Ht 64.0 in | Wt 201.2 lb

## 2011-03-16 DIAGNOSIS — K219 Gastro-esophageal reflux disease without esophagitis: Secondary | ICD-10-CM

## 2011-03-16 DIAGNOSIS — R1310 Dysphagia, unspecified: Secondary | ICD-10-CM

## 2011-03-16 MED ORDER — ESOMEPRAZOLE MAGNESIUM 40 MG PO CPDR: 40.0000 mg | DELAYED_RELEASE_CAPSULE | Freq: Every day | ORAL | Status: DC

## 2011-03-16 NOTE — Progress Notes (Signed)
Subjective:    Patient ID: Vanessa Harvey, female    DOB: 1960-09-22, 50 y.o.   MRN: [id number]  HPI Here for pain when swallowing Would get better and then get worse  If she does not eat late/ watches diet/ takes her aloe - she is better Cared for her father - who died for esoph ca   Was here in May for gerd and dysphagia On protonix now  nexium took care of it back then   Can switch back to nexium now and would like to do that   Only pills get stuck  A lot of mucous - feeling in chest and throat   Has never seen a GI doctor in the past  No stomach pain  Does cough more -- ? If from gerd or allergies  She has gone to allergist in past   Patient Active Problem List  Diagnoses  . VITAMIN B12 DEFICIENCY  . ANXIETY  . PANIC DISORDER  . DEPRESSION  . ALLERGIC RHINITIS  . ASTHMA  . DERMATITIS, OTHER ATOPIC  . BACK PAIN, LUMBAR  . Dysphagia  . GERD (gastroesophageal reflux disease)   Past Medical History  Diagnosis Date  . Allergic rhinitis, cause unspecified   . Anxiety state, unspecified   . Unspecified asthma   . Lumbago   . Depressive disorder, not elsewhere classified   . Other atopic dermatitis and related conditions   . Panic disorder without agoraphobia   . Other B-complex deficiencies   . Degenerative disc disease, lumbar    Past Surgical History  Procedure Date  . Knee arthroscopy 1984    evulsion of femur s/p fall  . Cesarean section   . Dexa 06/2004  . Abd [place] 09/2005    Negative  . Ganglion cyst excision 12/2005    knee   History  Substance Use Topics  . Smoking status: Never Smoker   . Smokeless tobacco: Not on file  . Alcohol Use: Yes     Rare   Family History  Problem Relation Age of Onset  . Hypertension Father     Mild  . Ulcerative colitis Father    Allergies  Allergen Reactions  . Penicillins     REACTION: as child   Current Outpatient Prescriptions on File Prior to Visit  Medication Sig Dispense Refill  .  Calcium-Magnesium-Zinc 1000-400-15 MG TABS Take 1 tablet by mouth daily.        . cyanocobalamin (,VITAMIN B-12,) 1000 MCG/ML injection inject 1 milliliter intramuscularly EVERY 3 WEEKS AS DIRECTED  4 mL  11  . Flaxseed, Linseed, (FLAX SEED OIL PO) Take 1 capsule by mouth daily.        Marland Kitchen FLUoxetine (PROZAC) 10 MG tablet Take 20 mg by mouth daily.        . Multiple Vitamin (MULTIVITAMIN) tablet Take 1 tablet by mouth daily.        Marland Kitchen SINGULAIR 10 MG tablet take 1 tablet by mouth once daily  30 tablet  11  . albuterol (PROVENTIL HFA) 108 (90 BASE) MCG/ACT inhaler Inhale 2 puffs into the lungs every 4 (four) hours as needed.        . ALPRAZolam (XANAX) 0.5 MG tablet Take 0.25-0.5 mg by mouth 2 (two) times daily as needed. For severe anxiety       . fluticasone (FLONASE) 50 MCG/ACT nasal spray 2 sprays by Nasal route daily.  16 g  11  . norethindrone-ethinyl estradiol (JUNEL FE 1/20) 1-20 MG-MCG per tablet Take  1 tablet by mouth daily.        . valACYclovir (VALTREX) 500 MG tablet Take 500 mg by mouth daily.              Review of Systems Review of Systems  Constitutional: Negative for fever, appetite change, fatigue and unexpected weight change.  Eyes: Negative for pain and visual disturbance.  ENT pos for hoarseness/ cough / globus sensation Respiratory: pos for cough  and shortness of breath.   Cardiovascular: Negative for cp or palpitations    Gastrointestinal: Negative for nausea, diarrhea and constipation. pos for heartburn, regurgitation Genitourinary: Negative for urgency and frequency.  Skin: Negative for pallor or rash   Neurological: Negative for weakness, light-headedness, numbness and headaches.  Hematological: Negative for adenopathy. Does not bruise/bleed easily.  Psychiatric/Behavioral: Negative for dysphoric mood. The patient is not nervous/anxious.         Objective:   Physical Exam  Constitutional: She appears well-developed and well-nourished. No distress.  HENT:    Head: Normocephalic and atraumatic.  Mouth/Throat: Oropharynx is clear and moist.  Eyes: Conjunctivae and EOM are normal. Pupils are equal, round, and reactive to light. No scleral icterus.  Neck: Normal range of motion. Neck supple. No JVD present. Carotid bruit is not present. No thyromegaly present.  Cardiovascular: Normal rate and normal heart sounds.  Exam reveals no gallop.   Pulmonary/Chest: Effort normal and breath sounds normal. No respiratory distress. She has no wheezes.  Abdominal: Soft. Bowel sounds are normal. She exhibits no distension and no mass. There is no tenderness. There is no rebound and no guarding.  Lymphadenopathy:    She has no cervical adenopathy.  Neurological: She is alert.  Skin: Skin is warm and dry. No rash noted. No erythema. No pallor.  Psychiatric: She has a normal mood and affect.          Assessment & Plan:

## 2011-03-16 NOTE — Assessment & Plan Note (Signed)
PPI helps- but not better nexium works best - px sent to pharmacy for that Much stress Ref to GI for likely EGD

## 2011-03-16 NOTE — Assessment & Plan Note (Signed)
Slightly improved but not resolved- with hx of GERd (and esoph ca in family) Will go back to nexium if covered since this works better than protonix  Ref to Gi for further eval and likely endoscopy

## 2011-03-16 NOTE — Patient Instructions (Addendum)
We will do a GI referral at check out  Switch to nexium - I hope that helps - I sent it to your pharmacy  Watch your diet closely Ask about changing to the [place] office

## 2011-03-19 ENCOUNTER — Telehealth: Payer: Self-pay

## 2011-03-19 NOTE — Telephone Encounter (Signed)
Pt notified by pharmacy that Nexium would have to be preapproved. Jacki Cones received form from pharmacy this AM and will begin process for prior authorization. Pt said this happened about 5 years ago and pt tried Protonix, Prevacid and Omeprazole and all were ineffective. I checked and we do not have samples of Nexium also.

## 2011-03-20 ENCOUNTER — Telehealth: Payer: Self-pay | Admitting: *Deleted

## 2011-03-20 NOTE — Telephone Encounter (Signed)
Prior auth given for nexium, advised pharmacy, approval letter placed on doctor's desk for scanning.

## 2011-03-20 NOTE — Telephone Encounter (Signed)
Completed form faxed to [phone] as instructed. Form given to Lorenzo.

## 2011-03-20 NOTE — Telephone Encounter (Signed)
Prior Berkley Harvey is needed for nexium, form from [facility] is on your shelf.  Pt states she has tried protonix, prevacid and omeprazole- none of these worked for her.

## 2011-03-20 NOTE — Telephone Encounter (Signed)
Thanks- form done and in IN box

## 2011-04-18 ENCOUNTER — Other Ambulatory Visit: Payer: Self-pay | Admitting: Family Medicine

## 2011-04-18 NOTE — Telephone Encounter (Signed)
OK to refill? Last seen 11/12.

## 2011-04-18 NOTE — Telephone Encounter (Signed)
Will refill electronically  

## 2011-06-07 ENCOUNTER — Other Ambulatory Visit: Payer: Self-pay | Admitting: Family Medicine

## 2011-10-13 ENCOUNTER — Other Ambulatory Visit: Payer: Self-pay | Admitting: Family Medicine

## 2011-10-16 NOTE — Telephone Encounter (Signed)
Done

## 2011-12-04 ENCOUNTER — Telehealth: Payer: Self-pay | Admitting: *Deleted

## 2011-12-04 NOTE — Telephone Encounter (Signed)
Vanessa Harvey with [facility] said insurance has changed and now charging $45 copay for generic Singulair; wants to know if can change med to Accolape/generic Zasirlukast.Please advise.

## 2011-12-04 NOTE — Telephone Encounter (Signed)
Received a note from pharmacy stating that insurance charges

## 2011-12-04 NOTE — Telephone Encounter (Signed)
I would prefer this to come through Dr. Milinda Antis.  I will defer to her.

## 2011-12-05 NOTE — Telephone Encounter (Signed)
She can change to the generic form of singulair- please call pharmacy and let them know

## 2011-12-06 MED ORDER — ZAFIRLUKAST 20 MG PO TABS: 20.0000 mg | ORAL_TABLET | Freq: Two times a day (BID) | ORAL | Status: DC

## 2011-12-06 NOTE — Telephone Encounter (Signed)
We can change to accolate - it is 20 mg bid - let me know if any problems/ side eff or if it does not work Will refill electronically

## 2011-12-06 NOTE — Telephone Encounter (Signed)
Spoke with Raynelle Fanning from [facility] she stated that the insurance was charging $45 for the generic of Singular, She wants to know if Rx can be changed to Accolate because they only charge $10 for that one.

## 2011-12-06 NOTE — Telephone Encounter (Signed)
I sent new px for accolate to her pharmacy -- sorry, lost the note , let her know , update me if side eff or if not working

## 2011-12-06 NOTE — Telephone Encounter (Signed)
LMOVM of cell phone. 

## 2011-12-07 NOTE — Telephone Encounter (Signed)
pt left v/m that she only pays $15 for name brand Singulair and does not want generic. Spoke with Hospital doctor at [employer] and could be pts plan has changes mid year;copay for Singulair is $45. Left v/m for pt to call back.

## 2011-12-11 NOTE — Telephone Encounter (Signed)
Patient notified as instructed by telephone 12/06/11 1:01pm note. Pt said her insurance co will no longer pay anything on Singulair; Pt is willing to try generic accolate. Pt is aware she has allergies; pt wants to be tested for asthma.Please advise. Pt contact # [phone] may leave detailed message.

## 2011-12-11 NOTE — Telephone Encounter (Signed)
Patient advised.  She is going to get the name of an allergist in [place] that she would like to see and will call back with the name and contact information.

## 2011-12-11 NOTE — Telephone Encounter (Signed)
I sent accolate to her pharmacy on 8/1  If she wants testing for asthma I will refer her to an allergist ... Let me know if agreeable and I will do the ref

## 2012-01-01 NOTE — Telephone Encounter (Signed)
Pt called back today; she had gotten letter from [facility] that our office did not supply needed info for prior auth of Singulair. I reviewed with pt the notes below and she said she wants to try PA for Singulair. Pt does not want to take Acolate due to possible liver problems and other possible side effects. Pt also does not want to see allergist about asthma. PA for for Singulair is on Dr Royden Purl shelf.

## 2012-01-02 NOTE — Telephone Encounter (Signed)
Is she ok with the generic for singulair? -- the form indicates if not we need proof that she cannot take the generic If not - I will px the generic -- it is labeled as the covered alternative  Thanks I will hold the form on my desk

## 2012-01-02 NOTE — Telephone Encounter (Signed)
Left message on patient vm asking her to call and verify whether or not she want the generic singular.

## 2012-01-03 NOTE — Telephone Encounter (Signed)
Pt left v/m that [facility] told pt generic Singulair does not require PA and pt can pick up generic Singulair for $15 co pay; pt is OK with generic Singulair. Spoke with Stanton Kidney at [facility] she confirmed generic Singulair does not require PA and already as prescription for pt.

## 2012-01-21 ENCOUNTER — Other Ambulatory Visit: Payer: Self-pay

## 2012-01-21 NOTE — Telephone Encounter (Signed)
Pt left v/m requesting Proventil inhaler to [facility]. Please advise.

## 2012-01-21 NOTE — Telephone Encounter (Signed)
Please give her one inhaler with 3 refils, thanks

## 2012-01-22 MED ORDER — ALBUTEROL SULFATE HFA 108 (90 BASE) MCG/ACT IN AERS: 2.0000 | INHALATION_SPRAY | RESPIRATORY_TRACT | Status: DC | PRN

## 2012-01-28 ENCOUNTER — Encounter: Payer: Self-pay | Admitting: Family Medicine

## 2012-01-28 ENCOUNTER — Ambulatory Visit (INDEPENDENT_AMBULATORY_CARE_PROVIDER_SITE_OTHER): Payer: 59 | Admitting: Family Medicine

## 2012-01-28 VITALS — BP 154/84 | HR 80 | Temp 98.4°F | Ht 65.0 in | Wt 202.0 lb

## 2012-01-28 DIAGNOSIS — J069 Acute upper respiratory infection, unspecified: Secondary | ICD-10-CM | POA: Insufficient documentation

## 2012-01-28 MED ORDER — GUAIFENESIN-CODEINE 100-10 MG/5ML PO SYRP: 5.0000 mL | ORAL_SOLUTION | Freq: Every evening | ORAL | Status: DC | PRN

## 2012-01-28 MED ORDER — BENZONATATE 100 MG PO CAPS: 100.0000 mg | ORAL_CAPSULE | Freq: Three times a day (TID) | ORAL | Status: DC | PRN

## 2012-01-28 NOTE — Progress Notes (Signed)
Subjective:    Patient ID: Vanessa Harvey, female    DOB: [birth year], 51 y.o.   MRN: [id number]  HPI Here for uri Had uri for 2 weeks- head and chest cong with low grade fever That is improved - but cough is persistant  Coughed so much- has aggrivating her reflux  Some hoarseness also  Not wheezing- just cough Cannot cough anything up Albuterol does help the cough  Also is allergy season   Taking mucinex - plain guifen Sudafed 12 hour decongestant day only (last time she took it sat- )  Patient Active Problem List  Diagnosis  . VITAMIN B12 DEFICIENCY  . ANXIETY  . PANIC DISORDER  . DEPRESSION  . ALLERGIC RHINITIS  . ASTHMA  . DERMATITIS, OTHER ATOPIC  . BACK PAIN, LUMBAR  . Dysphagia  . GERD (gastroesophageal reflux disease)   Past Medical History  Diagnosis Date  . Allergic rhinitis, cause unspecified   . Anxiety state, unspecified   . Unspecified asthma   . Lumbago   . Depressive disorder, not elsewhere classified   . Other atopic dermatitis and related conditions   . Panic disorder without agoraphobia   . Other B-complex deficiencies   . Degenerative disc disease, lumbar    Past Surgical History  Procedure Date  . Knee arthroscopy 1984    evulsion of femur s/p fall  . Cesarean section   . Dexa 06/2004  . Abd [place] 09/2005    Negative  . Ganglion cyst excision 12/2005    knee   History  Substance Use Topics  . Smoking status: Never Smoker   . Smokeless tobacco: Not on file  . Alcohol Use: Yes     Rare   Family History  Problem Relation Age of Onset  . Hypertension Father     Mild  . Ulcerative colitis Father    Allergies  Allergen Reactions  . Penicillins     REACTION: as child   Current Outpatient Prescriptions on File Prior to Visit  Medication Sig Dispense Refill  . albuterol (PROVENTIL HFA) 108 (90 BASE) MCG/ACT inhaler Inhale 2 puffs into the lungs every 4 (four) hours as needed.  1 Inhaler  1  . ALPRAZolam (XANAX) 0.5 MG tablet Take  0.25-0.5 mg by mouth 2 (two) times daily as needed. For severe anxiety       . B-D DISP NEEDLE 27GX1-1/4" 27G X 1-1/4" MISC use as directed WITH B12  4 each  PRN  . cyanocobalamin (,VITAMIN B-12,) 1000 MCG/ML injection inject 1 milliliter intramuscularly EVERY 3 WEEKS AS DIRECTED  4 mL  11  . esomeprazole (NEXIUM) 40 MG capsule Take 1 capsule (40 mg total) by mouth daily.  30 capsule  11  . FLUoxetine (PROZAC) 10 MG tablet take 2 tablets by mouth once daily  60 tablet  11  . valACYclovir (VALTREX) 500 MG tablet Take 500 mg by mouth daily.        . Calcium-Magnesium-Zinc 1000-400-15 MG TABS Take 1 tablet by mouth daily.        . Flaxseed, Linseed, (FLAX SEED OIL PO) Take 1 capsule by mouth daily.        . fluticasone (FLONASE) 50 MCG/ACT nasal spray 2 sprays by Nasal route daily.  16 g  11  . Multiple Vitamin (MULTIVITAMIN) tablet Take 1 tablet by mouth daily.        . norethindrone-ethinyl estradiol (JUNEL FE 1/20) 1-20 MG-MCG per tablet Take 1 tablet by mouth daily.        Marland Kitchen  vitamin E 200 UNIT capsule Take 200 Units by mouth daily.        . zafirlukast (ACCOLATE) 20 MG tablet Take 1 tablet (20 mg total) by mouth 2 (two) times daily.  60 tablet  11        Review of Systems Review of Systems  Constitutional: Negative for fever, appetite change,  and unexpected weight change. pos for fatigue ENT neg for sinus pain Eyes: Negative for pain and visual disturbance.  Respiratory: Negative for sob/ wheeze or sputum Cardiovascular: Negative for cp or palpitations    Gastrointestinal: Negative for nausea, diarrhea and constipation.  Genitourinary: Negative for urgency and frequency.  Skin: Negative for pallor or rash   Neurological: Negative for weakness, light-headedness, numbness and headaches.  Hematological: Negative for adenopathy. Does not bruise/bleed easily.  Psychiatric/Behavioral: Negative for dysphoric mood. The patient is not nervous/anxious.         Objective:   Physical Exam    Constitutional: She appears well-developed and well-nourished. No distress.       Mildly hoarse voice   HENT:  Head: Normocephalic and atraumatic.  Right Ear: External ear normal.  Left Ear: External ear normal.  Mouth/Throat: Oropharynx is clear and moist. No oropharyngeal exudate.       Nares are boggy and congested No facial tenderness  Eyes: Conjunctivae normal and EOM are normal. Pupils are equal, round, and reactive to light. Right eye exhibits no discharge. Left eye exhibits no discharge.  Neck: Normal range of motion. Neck supple. No JVD present. No thyromegaly present.  Cardiovascular: Normal rate and regular rhythm.   Pulmonary/Chest: Effort normal and breath sounds normal. No respiratory distress. She has no wheezes. She has no rales. She exhibits no tenderness.       Generally harsh bs No rhonchi or crackles Cough is harsh and dry sounding   Abdominal: Soft. Bowel sounds are normal. There is no tenderness.  Lymphadenopathy:    She has no cervical adenopathy.  Neurological: She is alert.  Skin: Skin is warm and dry. No rash noted. No erythema. No pallor.  Psychiatric: She has a normal mood and affect.          Assessment & Plan:

## 2012-01-28 NOTE — Patient Instructions (Addendum)
I think you have a cyclic cough after an upper respiratory infection We need to stop the cycle  Take robitussin DM during the day as directed Also tessalon pills - swallow whole three times daily  At night take the px cough med (which has codeine in it)- will sedate Use inhaler as needed  Update if not starting to improve in a week or if worsening

## 2012-01-28 NOTE — Assessment & Plan Note (Signed)
While uri is improved- cough seems to be cyclic- perpetuating itself (no help with allergies either) Will tx with tessalon tid, robitussin DM in day , and robitussin ac at night Fluids/rest Update if not starting to improve in a week or if worsening  - esp if prod cough/ wheeze or fever

## 2012-02-08 ENCOUNTER — Other Ambulatory Visit: Payer: Self-pay | Admitting: Family Medicine

## 2012-02-08 NOTE — Telephone Encounter (Signed)
Ok to refill? No recent lab, no future appt or lab appt

## 2012-02-09 NOTE — Telephone Encounter (Signed)
Please make her an appt for annual exam in the next 6 mo and refil med until then, thanks

## 2012-02-11 ENCOUNTER — Telehealth: Payer: Self-pay | Admitting: *Deleted

## 2012-02-11 NOTE — Telephone Encounter (Signed)
You advise pt to schedule CPE appt to have her med refilled and pt made appt on 02/22/12 for labs and 02/29/12 for CPE, but pt wanted to know if this is necessary since she gets annual exams at her gyn office, pt said she will have them fax over last labs and wanted to know once you get them if she still has to be seen for a CPE because she has never had one here, please advise

## 2012-02-11 NOTE — Telephone Encounter (Signed)
Pt left v/m her GYN will fax copy of 05/2011 labs and physical; if Dr Milinda Antis satisfied with Dr Phineas Real notes please cancel 10/18 and 10/25 appts.Please advise.

## 2012-02-11 NOTE — Telephone Encounter (Signed)
If her gyn does a full physical (not just pelvic and breast exam)- then just set her up for a 15 min f/u here  If her gyn only does pelvic and breast health- then make it a PE  Sounds like she will just need a follow up  thanks

## 2012-02-13 NOTE — Telephone Encounter (Signed)
Advise pt that we haven't received labs an physical for Gyn, pt said she is going to call them an have them re-fax it to [place]

## 2012-02-14 NOTE — Telephone Encounter (Signed)
Pt called an said that her Gyn told her that labs and physical were faxed already, do you have them and if so does she need to keep lab and physical appt?

## 2012-02-15 NOTE — Telephone Encounter (Signed)
Got them! Thanks- it looks like her gyn covers everything so she can cancel the appt unless she needs specific insurance forms filled out, etc thanks

## 2012-02-15 NOTE — Telephone Encounter (Signed)
Labs received and placed in your IN Box

## 2012-02-15 NOTE — Telephone Encounter (Signed)
I have the visit , but not the lab results yet- could you send for them and then send this back to me - I will review, thanks That will be very helpful

## 2012-02-15 NOTE — Telephone Encounter (Signed)
Notified pt and she didn't need any ins forms filled out so cx appts

## 2012-02-21 ENCOUNTER — Telehealth: Payer: Self-pay | Admitting: Family Medicine

## 2012-02-21 DIAGNOSIS — Z0000 Encounter for general adult medical examination without abnormal findings: Secondary | ICD-10-CM | POA: Insufficient documentation

## 2012-02-21 DIAGNOSIS — E538 Deficiency of other specified B group vitamins: Secondary | ICD-10-CM

## 2012-02-21 NOTE — Telephone Encounter (Signed)
Message copied by Judy Pimple on Thu Feb 21, 2012  5:37 PM ------      Message from: Alvina Chou      Created: Thu Feb 14, 2012 11:51 AM      Regarding: Lab orders for Friday, 10.18.13       Patient is scheduled for CPX labs, please order future labs, Thanks , Camelia Eng

## 2012-02-22 ENCOUNTER — Other Ambulatory Visit: Payer: 59

## 2012-02-29 ENCOUNTER — Encounter: Payer: 59 | Admitting: Family Medicine

## 2012-04-14 ENCOUNTER — Other Ambulatory Visit: Payer: Self-pay | Admitting: Family Medicine

## 2012-04-14 NOTE — Telephone Encounter (Signed)
Ok to refill? No recent CPE or f/u appt and no recent labs

## 2012-04-14 NOTE — Telephone Encounter (Signed)
It looks like her CPE was cancelled-please call her and re schedule for winter or spring and refil med until then thanks

## 2012-04-15 NOTE — Telephone Encounter (Signed)
Left voicemail requesting pt to call office, will try to call back later 

## 2012-04-16 ENCOUNTER — Telehealth: Payer: Self-pay

## 2012-04-16 MED ORDER — FLUOXETINE HCL 10 MG PO TABS: 20.0000 mg | ORAL_TABLET | Freq: Every day | ORAL | Status: DC

## 2012-04-16 NOTE — Telephone Encounter (Signed)
[facility] faxed PA for Nexium. Spoke with Janelle Floor at [phone]. Approval for 1 year over phone. [id number]. Approval letter to follow. Notified [facility] spoke with [facility] and rx went thru.

## 2012-04-16 NOTE — Telephone Encounter (Signed)
Pt had CPE and labs at GYN and had records faxed to [place], not due for another 1 and she has her CPE done at her GYN

## 2012-04-25 ENCOUNTER — Other Ambulatory Visit: Payer: Self-pay | Admitting: Family Medicine

## 2012-05-08 ENCOUNTER — Other Ambulatory Visit: Payer: Self-pay | Admitting: *Deleted

## 2012-05-08 MED ORDER — CYANOCOBALAMIN 1000 MCG/ML IJ SOLN: 1000.0000 ug | INTRAMUSCULAR | Status: DC

## 2012-06-03 ENCOUNTER — Telehealth: Payer: Self-pay | Admitting: Family Medicine

## 2012-07-01 ENCOUNTER — Other Ambulatory Visit: Payer: Self-pay | Admitting: Family Medicine

## 2012-07-10 ENCOUNTER — Other Ambulatory Visit: Payer: Self-pay | Admitting: Obstetrics and Gynecology

## 2012-07-10 DIAGNOSIS — R928 Other abnormal and inconclusive findings on diagnostic imaging of breast: Secondary | ICD-10-CM

## 2012-07-22 ENCOUNTER — Ambulatory Visit
Admission: RE | Admit: 2012-07-22 | Discharge: 2012-07-22 | Disposition: A | Discharge status: Home / Self Care | Payer: 59 | Source: Ambulatory Visit | Attending: Obstetrics and Gynecology | Admitting: Obstetrics and Gynecology

## 2012-07-22 DIAGNOSIS — R928 Other abnormal and inconclusive findings on diagnostic imaging of breast: Secondary | ICD-10-CM

## 2012-07-25 ENCOUNTER — Other Ambulatory Visit: Payer: 59

## 2012-08-07 ENCOUNTER — Other Ambulatory Visit: Payer: Self-pay | Admitting: Family Medicine

## 2012-08-07 NOTE — Telephone Encounter (Signed)
F/u: med refill appt scheduled and med refill x1 mth

## 2012-08-15 ENCOUNTER — Encounter: Payer: Self-pay | Admitting: Family Medicine

## 2012-08-15 ENCOUNTER — Ambulatory Visit (INDEPENDENT_AMBULATORY_CARE_PROVIDER_SITE_OTHER): Payer: 59 | Admitting: Family Medicine

## 2012-08-15 VITALS — BP 130/80 | HR 84 | Temp 97.4°F | Ht 65.0 in | Wt 208.2 lb

## 2012-08-15 DIAGNOSIS — E538 Deficiency of other specified B group vitamins: Secondary | ICD-10-CM

## 2012-08-15 DIAGNOSIS — K219 Gastro-esophageal reflux disease without esophagitis: Secondary | ICD-10-CM

## 2012-08-15 DIAGNOSIS — J45909 Unspecified asthma, uncomplicated: Secondary | ICD-10-CM

## 2012-08-15 DIAGNOSIS — F411 Generalized anxiety disorder: Secondary | ICD-10-CM

## 2012-08-15 MED ORDER — ESOMEPRAZOLE MAGNESIUM 40 MG PO CPDR: 40.0000 mg | DELAYED_RELEASE_CAPSULE | Freq: Every day | ORAL | Status: DC

## 2012-08-15 MED ORDER — VITAMIN B-12 1000 MCG SL SUBL: 1000.0000 ug | SUBLINGUAL_TABLET | Freq: Every day | SUBLINGUAL | Status: DC

## 2012-08-15 MED ORDER — MONTELUKAST SODIUM 10 MG PO TABS: 10.0000 mg | ORAL_TABLET | Freq: Every day | ORAL | Status: DC

## 2012-08-15 MED ORDER — FLUTICASONE PROPIONATE 50 MCG/ACT NA SUSP: 2.0000 | Freq: Every day | NASAL | Status: DC

## 2012-08-15 MED ORDER — ALPRAZOLAM 0.5 MG PO TABS: 0.2500 mg | ORAL_TABLET | Freq: Two times a day (BID) | ORAL | Status: DC | PRN

## 2012-08-15 MED ORDER — FLUOXETINE HCL 10 MG PO TABS: 20.0000 mg | ORAL_TABLET | Freq: Every day | ORAL | Status: DC

## 2012-08-15 NOTE — Progress Notes (Signed)
Subjective:    Patient ID: Vanessa Harvey, female    DOB: 29-Jun-1960, 52 y.o.   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 If GERD related (did better with weight loss)    Mood- good ! prozac works very well for her  Needs xanax to have on hand for emergencies - uses very rarely   Patient Active Problem List  Diagnosis  . VITAMIN B12 DEFICIENCY  . ANXIETY  . PANIC DISORDER  . DEPRESSION  . ALLERGIC RHINITIS  . ASTHMA  . DERMATITIS, OTHER ATOPIC  . BACK PAIN, LUMBAR  . GERD (gastroesophageal reflux disease)  . Routine general medical examination at a health care facility   Past Medical History  Diagnosis Date  . Allergic rhinitis, cause unspecified   . Anxiety state, unspecified   . Unspecified asthma   . Lumbago   . Depressive disorder, not elsewhere classified   . Other atopic dermatitis and related conditions   . Panic disorder without agoraphobia   . Other B-complex deficiencies   . Degenerative disc disease, lumbar    Past Surgical History  Procedure Laterality Date  . Knee arthroscopy  1984    evulsion of femur s/p fall  . Cesarean section    . Dexa  06/2004  . Abd [place]  09/2005    Negative  . Ganglion cyst excision  12/2005    knee   History  Substance Use Topics  . Smoking status: Never Smoker   . Smokeless tobacco: Not on file  . Alcohol Use: Yes     Comment: Rare   Family History  Problem  Relation Age of Onset  . Hypertension Father     Mild  . Ulcerative colitis Father    Allergies  Allergen Reactions  . Penicillins     REACTION: as child   Current Outpatient Prescriptions on File Prior to Visit  Medication Sig Dispense Refill  . albuterol (PROVENTIL HFA) 108 (90 BASE) MCG/ACT inhaler Inhale 2 puffs into the lungs every 4 (four) hours as needed.  1 Inhaler  1  . ALPRAZolam (XANAX) 0.5 MG tablet Take 0.25-0.5 mg by mouth 2 (two) times daily as needed. For severe anxiety       . B-D DISP NEEDLE 27GX1-1/4" 27G X 1-1/4" MISC use as directed WITH B12  4 each  PRN  . Calcium-Magnesium-Zinc 1000-400-15 MG TABS Take 1 tablet by mouth daily.        . cyanocobalamin (,VITAMIN B-12,) 1000 MCG/ML injection Inject 1 mL (1,000 mcg total) into the muscle every 21 ( twenty-one) days.  4 mL  2  . FLUoxetine (PROZAC) 10 MG tablet Take 2 tablets (20 mg total) by mouth daily.  60 tablet  5  . fluticasone (FLONASE) 50 MCG/ACT  nasal spray instill 2 sprays into each nostril once daily  16 g  0  . montelukast (SINGULAIR) 10 MG tablet take 1 tablet by mouth once daily  30 tablet  2  . Multiple Vitamin (MULTIVITAMIN) tablet Take 1 tablet by mouth daily.        Marland Kitchen NEXIUM 40 MG capsule take 1 capsule by mouth once daily  30 each  5  . valACYclovir (VALTREX) 500 MG tablet Take 500 mg by mouth daily.        . Flaxseed, Linseed, (FLAX SEED OIL PO) Take 1 capsule by mouth daily.        . norethindrone-ethinyl estradiol (JUNEL FE 1/20) 1-20 MG-MCG per tablet Take 1 tablet by mouth daily.        . vitamin E 200 UNIT capsule Take 200 Units by mouth daily.        . zafirlukast (ACCOLATE) 20 MG tablet Take 1 tablet (20 mg total) by mouth 2 (two) times daily.  60 tablet  11   No current facility-administered medications on file prior to visit.    Review of Systems Review of Systems  Constitutional: Negative for fever, appetite change, fatigue and unexpected weight change.  Eyes: Negative for pain and  visual disturbance.  Respiratory: Negative for shortness of breath or wheezing  Cardiovascular: Negative for cp or palpitations    Gastrointestinal: Negative for nausea, diarrhea and constipation.  Genitourinary: Negative for urgency and frequency.  Skin: Negative for pallor or rash   Neurological: Negative for weakness, light-headedness, numbness and headaches.  Hematological: Negative for adenopathy. Does not bruise/bleed easily.  Psychiatric/Behavioral: Negative for dysphoric mood. The patient is not nervous/anxious.         Objective:   Physical Exam  Constitutional: She appears well-developed and well-nourished. No distress.  HENT:  Head: Normocephalic and atraumatic.  Mouth/Throat: Oropharynx is clear and moist.  Some clear post nasal drip  Nares are boggy  Eyes: Conjunctivae and EOM are normal. Pupils are equal, round, and reactive to light. Right eye exhibits no discharge. Left eye exhibits no discharge. No scleral icterus.  Neck: Normal range of motion. Neck supple. No JVD present. Carotid bruit is not present. No thyromegaly present.  Cardiovascular: Normal rate, regular rhythm, normal heart sounds and intact distal pulses.  Exam reveals no gallop.   Pulmonary/Chest: Effort normal and breath sounds normal. No respiratory distress. She has no wheezes. She has no rales. She exhibits no tenderness.  No wheeze even on forced expiration  Abdominal: Soft. Bowel sounds are normal. She exhibits no distension, no abdominal bruit and no mass. There is no tenderness.  Musculoskeletal: She exhibits no edema and no tenderness.  Lymphadenopathy:    She has no cervical adenopathy.  Neurological: She is alert. She has normal reflexes. No cranial nerve deficit. She exhibits normal muscle tone. Coordination normal.  Skin: Skin is warm and dry. No rash noted. No erythema. No pallor.  Psychiatric: She has a normal mood and affect.          Assessment & Plan:

## 2012-08-15 NOTE — Assessment & Plan Note (Signed)
Due to shortage will have to stop shots until avail again Written for SL tab 1000 mcg daily -will see if that is covered

## 2012-08-15 NOTE — Assessment & Plan Note (Signed)
Fairly good control meds refilled  Cautioned about pollen exposure

## 2012-08-15 NOTE — Assessment & Plan Note (Signed)
occ cough-will watch that Continue nexium No heartburn Adv wt loss

## 2012-08-15 NOTE — Patient Instructions (Addendum)
See if the under the tongue B12 is affordable I want you to get 1000 mcg daily  Take care of yourself When able - start getting some exercise

## 2012-08-15 NOTE — Assessment & Plan Note (Signed)
Doing well with paxil Refilled xanax for emergencies- she uses this very seldomly  Recommend exercise

## 2012-10-21 NOTE — Telephone Encounter (Signed)
Enc opened in error

## 2012-10-24 ENCOUNTER — Ambulatory Visit: Payer: Self-pay

## 2013-01-16 ENCOUNTER — Other Ambulatory Visit: Payer: Self-pay | Admitting: Family Medicine

## 2013-01-20 NOTE — Telephone Encounter (Signed)
Vanessa Harvey with [facility] did not get approval for disp needle 27Gx1 1/4 ". Given verbally as instructed.

## 2013-05-06 ENCOUNTER — Telehealth: Payer: Self-pay | Admitting: *Deleted

## 2013-05-06 NOTE — Telephone Encounter (Signed)
Received prior auth request from pharmacy. Auth paperwork obtained, completed and faxed to insurance company. Pending notification from insurance company.

## 2013-05-08 NOTE — Telephone Encounter (Signed)
Approval form received by insurance company. Will send to provider for signature, then to be scanned into patients medical record. Pharmacy notified of approval via fax.

## 2013-05-26 ENCOUNTER — Other Ambulatory Visit: Payer: Self-pay | Admitting: Family Medicine

## 2013-06-03 ENCOUNTER — Other Ambulatory Visit: Payer: Self-pay

## 2013-06-03 MED ORDER — "NEEDLE (DISP) 27G X 1-1/4"" MISC": Status: DC

## 2013-06-29 ENCOUNTER — Ambulatory Visit (INDEPENDENT_AMBULATORY_CARE_PROVIDER_SITE_OTHER): Payer: 59 | Admitting: Family Medicine

## 2013-06-29 ENCOUNTER — Encounter: Payer: Self-pay | Admitting: Family Medicine

## 2013-06-29 VITALS — BP 136/70 | HR 77 | Temp 98.4°F | Ht 65.0 in | Wt 210.5 lb

## 2013-06-29 DIAGNOSIS — R5383 Other fatigue: Secondary | ICD-10-CM

## 2013-06-29 DIAGNOSIS — R599 Enlarged lymph nodes, unspecified: Secondary | ICD-10-CM

## 2013-06-29 DIAGNOSIS — R5381 Other malaise: Secondary | ICD-10-CM

## 2013-06-29 DIAGNOSIS — H9209 Otalgia, unspecified ear: Secondary | ICD-10-CM

## 2013-06-29 DIAGNOSIS — R590 Localized enlarged lymph nodes: Secondary | ICD-10-CM

## 2013-06-29 DIAGNOSIS — E538 Deficiency of other specified B group vitamins: Secondary | ICD-10-CM

## 2013-06-29 DIAGNOSIS — H9201 Otalgia, right ear: Secondary | ICD-10-CM

## 2013-06-29 HISTORY — DX: Localized enlarged lymph nodes: R59.0

## 2013-06-29 LAB — CBC WITH DIFFERENTIAL/PLATELET
Basophils Absolute: 0 10*3/uL (ref 0.0–0.1)
Basophils Relative: 0.5 % (ref 0.0–3.0)
EOS PCT: 2.5 % (ref 0.0–5.0)
Eosinophils Absolute: 0.2 10*3/uL (ref 0.0–0.7)
HEMATOCRIT: 36.7 % (ref 36.0–46.0)
Hemoglobin: 11.5 g/dL — ABNORMAL LOW (ref 12.0–15.0)
LYMPHS ABS: 3.1 10*3/uL (ref 0.7–4.0)
Lymphocytes Relative: 34.4 % (ref 12.0–46.0)
MCHC: 31.2 g/dL (ref 30.0–36.0)
MCV: 80.5 fl (ref 78.0–100.0)
MONOS PCT: 9.5 % (ref 3.0–12.0)
Monocytes Absolute: 0.9 10*3/uL (ref 0.1–1.0)
Neutro Abs: 4.8 10*3/uL (ref 1.4–7.7)
Neutrophils Relative %: 53.1 % (ref 43.0–77.0)
PLATELETS: 472 10*3/uL — AB (ref 150.0–400.0)
RBC: 4.56 Mil/uL (ref 3.87–5.11)
RDW: 15.2 % — ABNORMAL HIGH (ref 11.5–14.6)
WBC: 9 10*3/uL (ref 4.5–10.5)

## 2013-06-29 LAB — COMPREHENSIVE METABOLIC PANEL
ALT: 17 U/L (ref 0–35)
AST: 22 U/L (ref 0–37)
Albumin: 4.1 g/dL (ref 3.5–5.2)
Alkaline Phosphatase: 85 U/L (ref 39–117)
BILIRUBIN TOTAL: 0.4 mg/dL (ref 0.3–1.2)
BUN: 12 mg/dL (ref 6–23)
CO2: 24 meq/L (ref 19–32)
CREATININE: 1 mg/dL (ref 0.4–1.2)
Calcium: 9.6 mg/dL (ref 8.4–10.5)
Chloride: 105 mEq/L (ref 96–112)
GFR: 62.02 mL/min (ref >60.00)
GLUCOSE: 88 mg/dL (ref 70–99)
Potassium: 3.9 mEq/L (ref 3.5–5.1)
SODIUM: 140 meq/L (ref 135–145)
TOTAL PROTEIN: 7.7 g/dL (ref 6.0–8.3)

## 2013-06-29 LAB — TSH: TSH: 1.79 u[IU]/mL (ref 0.35–5.50)

## 2013-06-29 LAB — VITAMIN B12: VITAMIN B 12: 1345 pg/mL — AB (ref 211–911)

## 2013-06-29 NOTE — Assessment & Plan Note (Signed)
Nl exam Suspect this may be ref pain from recent tooth ext on that side and adenopathy Disc symptomatic care - see instructions on AVS  Update if not starting to improve in a week or if worsening

## 2013-06-29 NOTE — Patient Instructions (Signed)
Take care of yourself  Use ibuprofen (take it with food) - for ear and jaw pain - update me if no further improvement in 1-2 weeks  Drink water/ eat healthy and push yourself to exercise a bit  Lab today for fatigue If you become interested in a sleep clinic referral please let me know

## 2013-06-29 NOTE — Assessment & Plan Note (Signed)
Suspect this is due to recent tooth extraction and will improve/ (reactive) Will watch for growth/s/s of infection or pain Recommend nsaid and warm compress Continue to obs

## 2013-06-29 NOTE — Assessment & Plan Note (Signed)
Lab today Pt notes worse fatigue

## 2013-06-29 NOTE — Progress Notes (Signed)
Subjective:    Patient ID: Vanessa Harvey, female    DOB: [birth year], 53 y.o.   MRN: [id number]  HPI  Here for ear pain R  Had a tooth extracted about a week ago  That site is fine - last stitch came out this am  Did not take any abx for the tooth   Now - several days- R ear hurts and a gland under jaw is painful  Exhausted  A lot of mucous- post nasal drip mostly  Had some headache in forehead - that went away   occ takes sudafed for ETD - but it makes her heart palpitate   Is using flonase   Is exhausted all the time  ? Why  Cannot exercise due to fatigue  Does snore Would like labs  Husband notices apnea   Patient Active Problem List   Diagnosis Date Noted  . Routine general medical examination at a health care facility 02/21/2012  . GERD (gastroesophageal reflux disease) 09/08/2010  . BACK PAIN, LUMBAR 03/29/2010  . VITAMIN B12 DEFICIENCY 01/24/2010  . ANXIETY 12/22/2008  . PANIC DISORDER 01/08/2007  . DEPRESSION 01/08/2007  . ALLERGIC RHINITIS 01/08/2007  . ASTHMA 01/08/2007  . DERMATITIS, OTHER ATOPIC 12/19/2006   Past Medical History  Diagnosis Date  . Allergic rhinitis, cause unspecified   . Anxiety state, unspecified   . Unspecified asthma(493.90)   . Lumbago   . Depressive disorder, not elsewhere classified   . Other atopic dermatitis and related conditions   . Panic disorder without agoraphobia   . Other B-complex deficiencies   . Degenerative disc disease, lumbar    Past Surgical History  Procedure Laterality Date  . Knee arthroscopy  1984    evulsion of femur s/p fall  . Cesarean section    . Dexa  06/2004  . Abd [place]  09/2005    Negative  . Ganglion cyst excision  12/2005    knee   History  Substance Use Topics  . Smoking status: Never Smoker   . Smokeless tobacco: Not on file  . Alcohol Use: Yes     Comment: Rare   Family History  Problem Relation Age of Onset  . Hypertension Father     Mild  . Ulcerative colitis Father     Allergies  Allergen Reactions  . Penicillins     REACTION: as child   Current Outpatient Prescriptions on File Prior to Visit  Medication Sig Dispense Refill  . albuterol (PROVENTIL HFA) 108 (90 BASE) MCG/ACT inhaler Inhale 2 puffs into the lungs every 4 (four) hours as needed.  1 Inhaler  1  . ALPRAZolam (XANAX) 0.5 MG tablet Take 0.5-1 tablets (0.25-0.5 mg total) by mouth 2 (two) times daily as needed for anxiety. For severe anxiety  30 tablet  0  . Calcium-Magnesium-Zinc 1000-400-15 MG TABS Take 1 tablet by mouth daily.        . cyanocobalamin (,VITAMIN B-12,) 1000 MCG/ML injection Inject 1 mL (1,000 mcg total) into the muscle every 21 ( twenty-one) days. *follow-up appt. required before future refills are given*  4 mL  0  . Cyanocobalamin (VITAMIN B-12) 1000 MCG SUBL Place 1 tablet (1,000 mcg total) under the tongue daily.  30 tablet  11  . esomeprazole (NEXIUM) 40 MG capsule Take 1 capsule (40 mg total) by mouth daily before breakfast.  30 capsule  11  . FLUoxetine (PROZAC) 10 MG tablet Take 2 tablets (20 mg total) by mouth daily.  60 tablet  11  . fluticasone (FLONASE) 50 MCG/ACT nasal spray Place 2 sprays into the nose daily.  16 g  11  . montelukast (SINGULAIR) 10 MG tablet Take 1 tablet (10 mg total) by mouth at bedtime.  30 tablet  11  . Multiple Vitamin (MULTIVITAMIN) tablet Take 1 tablet by mouth daily.        Marland Kitchen NEEDLE, DISP, 27 G (B-D DISP NEEDLE 27GX1-1/4") 27G X 1-1/4" MISC use as directed WITH B12  4 each  0  . norethindrone-ethinyl estradiol (JUNEL FE 1/20) 1-20 MG-MCG per tablet Take 1 tablet by mouth daily.        . vitamin E 200 UNIT capsule Take 200 Units by mouth daily.         No current facility-administered medications on file prior to visit.       Review of Systems Review of Systems  Constitutional: Negative for fever, appetite change,  and unexpected weight change. pos for fatigue ENT neg for nasal cong or ST, neg for hearing loss  Eyes: Negative for pain  and visual disturbance.  Respiratory: Negative for cough and shortness of breath.   Cardiovascular: Negative for cp or palpitations    Gastrointestinal: Negative for nausea, diarrhea and constipation.  Genitourinary: Negative for urgency and frequency.  Skin: Negative for pallor or rash   Neurological: Negative for weakness, light-headedness, numbness and headaches.  Hematological: Negative for adenopathy. Does not bruise/bleed easily.  Psychiatric/Behavioral: Negative for dysphoric mood. The patient is not nervous/anxious.         Objective:   Physical Exam  Constitutional: She appears well-developed and well-nourished. No distress.  obese and well appearing   HENT:  Head: Normocephalic and atraumatic.  Right Ear: External ear normal.  Left Ear: External ear normal.  Mouth/Throat: Oropharynx is clear and moist. No oropharyngeal exudate.  Nares are boggy No facial tenderness No temporal tenderness  Tooth ext site (upper molar)-looks clean and well   Eyes: Conjunctivae and EOM are normal. Pupils are equal, round, and reactive to light. Right eye exhibits no discharge. Left eye exhibits no discharge. No scleral icterus.  Neck: Normal range of motion. Neck supple.  R submandibular node-enl and tender/ mobile   Lymphadenopathy:    She has cervical adenopathy.          Assessment & Plan:

## 2013-06-29 NOTE — Assessment & Plan Note (Signed)
Lab today Also disc poss of sleep apnea with loud snoring and witnessed breath holding

## 2013-06-29 NOTE — Progress Notes (Signed)
Pre visit review using our clinic review tool, if applicable. No additional management support is needed unless otherwise documented below in the visit note. 

## 2013-07-01 ENCOUNTER — Encounter: Payer: Self-pay | Admitting: *Deleted

## 2013-09-10 ENCOUNTER — Other Ambulatory Visit: Payer: Self-pay | Admitting: Family Medicine

## 2013-09-10 NOTE — Telephone Encounter (Signed)
Electronic refill request, pt has no future appt and last appt at office was 06/2313 for ear problems, but before that last appt was a med refill on 08/15/12

## 2013-09-10 NOTE — Telephone Encounter (Signed)
Please schedule PE in summer or fall and refill until then, thanks

## 2013-09-11 ENCOUNTER — Other Ambulatory Visit: Payer: Self-pay | Admitting: *Deleted

## 2013-09-11 NOTE — Telephone Encounter (Signed)
Called pt twice and phone rings a few times then drops call

## 2013-09-15 NOTE — Telephone Encounter (Signed)
Pt wanted f/u not CPE, f/u scheduled in fall and meds refilled until then

## 2013-09-15 NOTE — Telephone Encounter (Signed)
Pt left v/m returning call and request cb [phone].

## 2013-09-15 NOTE — Telephone Encounter (Signed)
Left voicemail requesting pt to call office 

## 2013-10-28 ENCOUNTER — Other Ambulatory Visit: Payer: Self-pay | Admitting: Family Medicine

## 2014-01-06 ENCOUNTER — Telehealth: Payer: Self-pay | Admitting: Family Medicine

## 2014-01-06 NOTE — Telephone Encounter (Signed)
Pt notified of Dr. Tower's comments  

## 2014-01-06 NOTE — Telephone Encounter (Signed)
Pt left vm stating that she works at a [employer] and they are offering free flu shots to employees in September.  She wants to know if she gets on in September, would Dr. Glori Bickers advise her to get another one in January since she's been told it's only good for 4 months.  I informed her that we usually do not give more than one flu shot during the season but she wanted Dr. Marliss Coots opinion.  She is requesting that someone call her back and leave a message on her mobile vm with Dr. Marliss Coots recommendations.

## 2014-01-06 NOTE — Telephone Encounter (Signed)
It is good for the whole season - does not need another one after the one she gets

## 2014-01-08 ENCOUNTER — Ambulatory Visit: Payer: 59 | Admitting: Family Medicine

## 2014-07-19 ENCOUNTER — Ambulatory Visit: Payer: Self-pay | Admitting: Gastroenterology

## 2014-11-09 ENCOUNTER — Other Ambulatory Visit: Payer: Self-pay | Admitting: Family Medicine

## 2014-11-09 NOTE — Telephone Encounter (Signed)
Routing to provider  

## 2014-12-06 ENCOUNTER — Other Ambulatory Visit: Payer: Self-pay | Admitting: Family Medicine

## 2014-12-06 NOTE — Telephone Encounter (Signed)
Routing to provider  

## 2015-01-26 ENCOUNTER — Other Ambulatory Visit: Payer: Self-pay | Admitting: Family Medicine

## 2015-01-26 NOTE — Telephone Encounter (Signed)
Routing to provider  

## 2015-03-02 ENCOUNTER — Other Ambulatory Visit: Payer: Self-pay | Admitting: Family Medicine

## 2015-03-02 NOTE — Telephone Encounter (Signed)
Routing to provider  

## 2015-03-02 NOTE — Telephone Encounter (Signed)
rx approved

## 2015-04-18 ENCOUNTER — Other Ambulatory Visit: Payer: Self-pay | Admitting: Family Medicine

## 2015-04-18 NOTE — Telephone Encounter (Signed)
Last visit was more than 6 months ago Please call patient; see how she's doing; if stable on medicine and no further needs, I'll refill If any issues with anxiety or she'd like an appt, please offer visit and I'll refill med 30 days

## 2015-04-18 NOTE — Telephone Encounter (Signed)
Routing to provider  

## 2015-04-19 NOTE — Telephone Encounter (Signed)
Spoke with patient, she is doing well on it. No complaints.  She is going to call back in Jan to schedule a CPE.

## 2015-05-23 ENCOUNTER — Other Ambulatory Visit: Payer: Self-pay

## 2015-05-23 MED ORDER — FLUOXETINE HCL 10 MG PO TABS: 20.0000 mg | ORAL_TABLET | Freq: Every morning | ORAL | Status: DC

## 2015-05-23 NOTE — Telephone Encounter (Signed)
Routing to provider. Patient last seen on 09/27/14. She has not scheduled an appointment to be seen yet.

## 2015-05-23 NOTE — Telephone Encounter (Signed)
Last phone note reviewed; I was hoping pt would schedule an appt I'll approve one more month

## 2015-06-27 ENCOUNTER — Other Ambulatory Visit: Payer: Self-pay | Admitting: Family Medicine

## 2015-07-06 ENCOUNTER — Encounter: Payer: Self-pay | Admitting: Family Medicine

## 2015-07-06 ENCOUNTER — Ambulatory Visit (INDEPENDENT_AMBULATORY_CARE_PROVIDER_SITE_OTHER): Payer: Commercial Managed Care - HMO | Admitting: Family Medicine

## 2015-07-06 VITALS — BP 148/83 | HR 79 | Temp 97.5°F | Ht 65.0 in | Wt 209.0 lb

## 2015-07-06 DIAGNOSIS — R200 Anesthesia of skin: Secondary | ICD-10-CM

## 2015-07-06 DIAGNOSIS — R030 Elevated blood-pressure reading, without diagnosis of hypertension: Secondary | ICD-10-CM

## 2015-07-06 DIAGNOSIS — J320 Chronic maxillary sinusitis: Secondary | ICD-10-CM | POA: Diagnosis not present

## 2015-07-06 DIAGNOSIS — R600 Localized edema: Secondary | ICD-10-CM | POA: Diagnosis not present

## 2015-07-06 DIAGNOSIS — E669 Obesity, unspecified: Secondary | ICD-10-CM | POA: Diagnosis not present

## 2015-07-06 DIAGNOSIS — IMO0001 Reserved for inherently not codable concepts without codable children: Secondary | ICD-10-CM | POA: Insufficient documentation

## 2015-07-06 DIAGNOSIS — R208 Other disturbances of skin sensation: Secondary | ICD-10-CM | POA: Diagnosis not present

## 2015-07-06 DIAGNOSIS — K219 Gastro-esophageal reflux disease without esophagitis: Secondary | ICD-10-CM | POA: Diagnosis not present

## 2015-07-06 DIAGNOSIS — J4520 Mild intermittent asthma, uncomplicated: Secondary | ICD-10-CM

## 2015-07-06 HISTORY — DX: Anesthesia of skin: R20.0

## 2015-07-06 MED ORDER — FUROSEMIDE 20 MG PO TABS: 20.0000 mg | ORAL_TABLET | Freq: Every day | ORAL | Status: DC

## 2015-07-06 MED ORDER — POTASSIUM CHLORIDE ER 10 MEQ PO TBCR: 10.0000 meq | EXTENDED_RELEASE_TABLET | Freq: Every day | ORAL | Status: DC

## 2015-07-06 MED ORDER — DOXYCYCLINE HYCLATE 100 MG PO TABS: 100.0000 mg | ORAL_TABLET | Freq: Two times a day (BID) | ORAL | Status: AC

## 2015-07-06 NOTE — Assessment & Plan Note (Signed)
Avoid/limit triggers when able; she has HOB elevated

## 2015-07-06 NOTE — Assessment & Plan Note (Signed)
No visual disturbance; will get NCS/EMG to see if peripheral or other; advised her to notify me right away of any visual disturbances; will try 3 days of furosemide to see if fluid overload causing compressive symptoms

## 2015-07-06 NOTE — Progress Notes (Signed)
BP 148/83 mmHg  Pulse 79  Temp(Src) 97.5 F (36.4 C)  Ht 5\' 5"  (1.651 m)  Wt 209 lb (94.802 kg)  BMI 34.78 kg/m2  SpO2 98%  LMP 05/07/2009   Subjective:    Patient ID: Vanessa Harvey, female    DOB: May 20, 1960, 55 y.o.   MRN: [id number]  HPI: Vanessa Harvey is a 55 y.o. female  Chief Complaint  Patient presents with  . Sinusitis    off and on x 1 month but seems to be improving today.   She got sick in December with sinus stuff; did not go to doctor Then had flu-like illness, fever 102 for 24 hours; achey;   Had teeth checked yesterday and the teeth are fine; having numbness on the right cheek; not much stuffiness, just a lot of thick drainage; yellowish greyish greenish; started to use neti pot a few days and that is making it better; getting some drainage out; most of it is going down the back of the throat  She always has a cough, when I asked about cough with illness; she was diagnosed with asthma years ago; on singulair and on rescue inhaler maybe twice a year; worse when air is hot and humid, July and August  She went to Dr. Clydell Hakim office, ortho; she feels like a numbness in the right lateral lower leg and it wraps around the right shin; he told her it's her back; she had a cyst in the right knee; another ortho years ago saw her for the same thing and she had an MRI and they said she has a cyst in the [place]"; she says the numbness has been there since November; she was tucking the legs back and that irritated it; if she does gardening and has knee in full flexion, then really makes it worse; it does go away, it will go away   She says her mood medicine is doing well and she'd like to stay on that  Relevant past medical, surgical, family and social history reviewed and updated as indicated Father had asthma Allergies and medications reviewed and updated.  Review of Systems  HENT: Positive for postnasal drip. Negative for dental problem.   Eyes: Negative  for visual disturbance.  Per HPI unless specifically indicated above     Objective:    BP 148/83 mmHg  Pulse 79  Temp(Src) 97.5 F (36.4 C)  Ht 5\' 5"  (1.651 m)  Wt 209 lb (94.802 kg)  BMI 34.78 kg/m2  SpO2 98%  LMP 05/07/2009  Wt Readings from Last 3 Encounters:  07/06/15 209 lb (94.802 kg)  06/29/13 210 lb 8 oz (95.482 kg)  08/15/12 208 lb 4 oz (94.462 kg)    Today's Vitals   07/06/15 0837 07/06/15 0915  BP: 153/85 148/83  Pulse: 83 79  Temp: 97.5 F (36.4 C)   Height: 5\' 5"  (1.651 m)   Weight: 209 lb (94.802 kg)   SpO2: 98%    Physical Exam  Constitutional: She appears well-developed and well-nourished. No distress.  obese  HENT:  Head: Normocephalic and atraumatic.  Right Ear: Hearing, tympanic membrane, external ear and ear canal normal. Tympanic membrane is not retracted. No middle ear effusion.  Left Ear: Hearing, tympanic membrane, external ear and ear canal normal. Tympanic membrane is not retracted.  No middle ear effusion.  Nose: Rhinorrhea (yellowish discharge right>>left) present. Right sinus exhibits maxillary sinus tenderness. Right sinus exhibits no frontal sinus tenderness. Left sinus exhibits no maxillary sinus tenderness  and no frontal sinus tenderness.  Mouth/Throat: Mucous membranes are normal. No oral lesions. Normal dentition. No dental abscesses or dental caries. No oropharyngeal exudate, posterior oropharyngeal edema or posterior oropharyngeal erythema.  Eyes: EOM are normal. No scleral icterus.  Neck: No thyromegaly present.  Cardiovascular: Normal rate, regular rhythm and normal heart sounds.   No murmur heard. Pulmonary/Chest: Effort normal and breath sounds normal. No respiratory distress. She has no wheezes.  Abdominal: Soft. Bowel sounds are normal. She exhibits no distension.  Musculoskeletal: Normal range of motion. She exhibits no edema (trace).  Lymphadenopathy:    She has cervical adenopathy (shoddy right submandibular).   Neurological: She is alert. She exhibits normal muscle tone.  Sensation intact bilateral lower extremities to monofilament and cold, left = right  Skin: Skin is warm and dry. She is not diaphoretic. No pallor.  Psychiatric: She has a normal mood and affect. Her behavior is normal. Judgment and thought content normal.      Assessment & Plan:   Problem List Items Addressed This Visit      Respiratory   Asthma, mild intermittent    Spirometry today; normal; discussed goal rescue inhaler use no more than twice a week; she has HOB elevated, in case reflux trigger      Relevant Orders   Spirometry with graph (Completed)     Digestive   GERD (gastroesophageal reflux disease)    Avoid/limit triggers when able; she has HOB elevated        Other   Obesity    See after visit summary; encouraged weight loss      Elevated blood pressure    See after visit summary; encouraged DASH guidelines, weight loss; recheck in two to three weeks      Numbness in right leg    No visual disturbance; will get NCS/EMG to see if peripheral or other; advised her to notify me right away of any visual disturbances; will try 3 days of furosemide to see if fluid overload causing compressive symptoms      Relevant Orders   Nerve conduction test    Other Visit Diagnoses    Right maxillary sinusitis    -  Primary    Relevant Medications    doxycycline (VIBRA-TABS) 100 MG tablet    Bilateral edema of lower extremity        limit salt; furosemide + [place] daily for 3 days; will see if fluid overload causes compression leading to numbness in right leg        Follow up plan: Return 2-3 weeks, for complete physical and fasting labs.   An after-visit summary was printed and given to the patient at [facility].  Please see the patient instructions which may contain other information and recommendations beyond what is mentioned above in the assessment and plan.  Meds ordered this encounter  Medications  .  L-Lysine 500 MG TABS    Sig: Take by mouth daily.  Marland Kitchen glucosamine-chondroitin ([facility] GLUCOSAMINE-CHONDROITIN) 500-400 MG tablet    Sig: Take 1 tablet by mouth daily.  Marland Kitchen ibuprofen (ADVIL,MOTRIN) 200 MG tablet    Sig: Take 400 mg by mouth as needed.  . doxycycline (VIBRA-TABS) 100 MG tablet    Sig: Take 1 tablet (100 mg total) by mouth 2 (two) times daily.    Dispense:  20 tablet    Refill:  0  . furosemide (LASIX) 20 MG tablet    Sig: Take 1 tablet (20 mg total) by mouth daily.    Dispense:  3 tablet    Refill:  0  . potassium chloride (KLOR-CON 10) 10 MEQ tablet    Sig: Take 1 tablet (10 mEq total) by mouth daily.    Dispense:  3 tablet    Refill:  0   Orders Placed This Encounter  Procedures  . Nerve conduction test  . Spirometry with graph

## 2015-07-06 NOTE — Assessment & Plan Note (Addendum)
Spirometry today; normal; discussed goal rescue inhaler use no more than twice a week; she has HOB elevated, in case reflux trigger

## 2015-07-06 NOTE — Assessment & Plan Note (Addendum)
See after visit summary; encouraged DASH guidelines, weight loss; recheck in two to three weeks

## 2015-07-06 NOTE — Patient Instructions (Addendum)
Start the antibiotic Please do eat yogurt daily or take a probiotic daily for the next month or two We want to replace the healthy germs in the gut If you notice foul, watery diarrhea in the next two months, schedule an appointment RIGHT AWAY If you need something for aches or pains, try to use Tylenol (acetaminphen) instead of non-steroidals (which include Aleve, ibuprofen, Advil, Motrin, and naproxen); non-steroidals can cause long-term kidney damage Your goal blood pressure is less than 140 mmHg on top. Try to follow the DASH guidelines (DASH stands for Dietary Approaches to Stop Hypertension) Try to limit the sodium in your diet.  Ideally, consume less than 1.5 grams (less than 1,500mg ) per day. Do not add salt when cooking or at the table.  Check the sodium amount on labels when shopping, and choose items lower in sodium when given a choice. Avoid or limit foods that already contain a lot of sodium. Eat a diet rich in fruits and vegetables and whole grains. Check out the information at familydoctor.org entitled "What It Takes to Lose Weight" Try to lose between 1-2 pounds per week by taking in fewer calories and burning off more calories You can succeed by limiting portions, limiting foods dense in calories and fat, becoming more active, and drinking 8 glasses of water a day (64 ounces) Don't skip meals, especially breakfast, as skipping meals may alter your metabolism Do not use over-the-counter weight loss pills or gimmicks that claim rapid weight loss A healthy BMI (or body mass index) is between 18.5 and 24.9 You can calculate your ideal BMI at the [place] website ClubMonetize.fr I do recommend a baby aspirin 81 mg coated daily for stroke prevention I recommend you return for a complete physical in the next 2-3 weeks and we'll discuss other age-appropriate preventive measures and tests I've ordered nerve conduction studies of your right leg  to evaluate the numbness Try to use PLAIN allergy medicine without the decongestant Avoid: phenylephrine, phenylpropanolamine, and pseudoephredine   DASH Eating Plan DASH stands for "Dietary Approaches to Stop Hypertension." The DASH eating plan is a healthy eating plan that has been shown to reduce high blood pressure (hypertension). Additional health benefits may include reducing the risk of type 2 diabetes mellitus, heart disease, and stroke. The DASH eating plan may also help with weight loss. WHAT DO I NEED TO KNOW ABOUT THE DASH EATING PLAN? For the DASH eating plan, you will follow these general guidelines:  Choose foods with a percent daily value for sodium of less than 5% (as listed on the food label).  Use salt-free seasonings or herbs instead of table salt or sea salt.  Check with your health care provider or pharmacist before using salt substitutes.  Eat lower-sodium products, often labeled as "lower sodium" or "no salt added."  Eat fresh foods.  Eat more vegetables, fruits, and low-fat dairy products.  Choose whole grains. Look for the word "whole" as the first word in the ingredient list.  Choose fish and skinless chicken or [place] more often than red meat. Limit fish, poultry, and meat to 6 oz (170 g) each day.  Limit sweets, desserts, sugars, and sugary drinks.  Choose heart-healthy fats.  Limit cheese to 1 oz (28 g) per day.  Eat more home-cooked food and less restaurant, buffet, and fast food.  Limit fried foods.  Cook foods using methods other than frying.  Limit canned vegetables. If you do use them, rinse them well to decrease the sodium.  When eating  at a restaurant, ask that your food be prepared with less salt, or no salt if possible. WHAT FOODS CAN I EAT? Seek help from a dietitian for individual calorie needs. Grains Whole grain or whole wheat bread. Brown rice. Whole grain or whole wheat pasta. Quinoa, bulgur, and whole grain cereals. Low-sodium  cereals. Corn or whole wheat flour tortillas. Whole grain cornbread. Whole grain crackers. Low-sodium crackers. Vegetables Fresh or frozen vegetables (raw, steamed, roasted, or grilled). Low-sodium or reduced-sodium tomato and vegetable juices. Low-sodium or reduced-sodium tomato sauce and paste. Low-sodium or reduced-sodium canned vegetables.  Fruits All fresh, canned (in natural juice), or frozen fruits. Meat and Other Protein Products Ground beef (85% or leaner), grass-fed beef, or beef trimmed of fat. Skinless chicken or [place]. Ground chicken or [place]. Pork trimmed of fat. All fish and seafood. Eggs. Dried beans, peas, or lentils. Unsalted nuts and seeds. Unsalted canned beans. Dairy Low-fat dairy products, such as skim or 1% milk, 2% or reduced-fat cheeses, low-fat ricotta or cottage cheese, or plain low-fat yogurt. Low-sodium or reduced-sodium cheeses. Fats and Oils Tub margarines without trans fats. Light or reduced-fat mayonnaise and salad dressings (reduced sodium). Avocado. Safflower, olive, or canola oils. Natural peanut or almond butter. Other Unsalted popcorn and pretzels. The items listed above may not be a complete list of recommended foods or beverages. Contact your dietitian for more options. WHAT FOODS ARE NOT RECOMMENDED? Grains White bread. White pasta. White rice. Refined cornbread. Bagels and croissants. Crackers that contain trans fat. Vegetables Creamed or fried vegetables. Vegetables in a cheese sauce. Regular canned vegetables. Regular canned tomato sauce and paste. Regular tomato and vegetable juices. Fruits Dried fruits. Canned fruit in light or heavy syrup. Fruit juice. Meat and Other Protein Products Fatty cuts of meat. Ribs, chicken wings, bacon, sausage, bologna, salami, chitterlings, fatback, hot dogs, bratwurst, and packaged luncheon meats. Salted nuts and seeds. Canned beans with salt. Dairy Whole or 2% milk, cream, half-and-half, and cream cheese.  Whole-fat or sweetened yogurt. Full-fat cheeses or blue cheese. Nondairy creamers and whipped toppings. Processed cheese, cheese spreads, or cheese curds. Condiments Onion and garlic salt, seasoned salt, table salt, and sea salt. Canned and packaged gravies. Worcestershire sauce. Tartar sauce. Barbecue sauce. Teriyaki sauce. Soy sauce, including reduced sodium. Steak sauce. Fish sauce. Oyster sauce. Cocktail sauce. Horseradish. Ketchup and mustard. Meat flavorings and tenderizers. Bouillon cubes. Hot sauce. Tabasco sauce. Marinades. Taco seasonings. Relishes. Fats and Oils Butter, stick margarine, lard, shortening, ghee, and bacon fat. Coconut, palm kernel, or palm oils. Regular salad dressings. Other Pickles and olives. Salted popcorn and pretzels. The items listed above may not be a complete list of foods and beverages to avoid. Contact your dietitian for more information. WHERE CAN I FIND MORE INFORMATION? National Heart, Lung, and Blood Institute: travelstabloid.com   This information is not intended to replace advice given to you by your health care provider. Make sure you discuss any questions you have with your health care provider.   Document Released: 04/12/2011 Document Revised: 05/14/2014 Document Reviewed: 02/25/2013 Elsevier Interactive Patient Education [employer].

## 2015-07-06 NOTE — Assessment & Plan Note (Signed)
See after visit summary; encouraged weight loss

## 2015-07-20 ENCOUNTER — Encounter: Payer: Self-pay | Admitting: Family Medicine

## 2015-07-20 ENCOUNTER — Ambulatory Visit (INDEPENDENT_AMBULATORY_CARE_PROVIDER_SITE_OTHER): Payer: Commercial Managed Care - HMO | Admitting: Family Medicine

## 2015-07-20 VITALS — BP 142/76 | HR 85 | Temp 97.4°F | Ht 65.0 in | Wt 210.0 lb

## 2015-07-20 DIAGNOSIS — R590 Localized enlarged lymph nodes: Secondary | ICD-10-CM

## 2015-07-20 DIAGNOSIS — Z0000 Encounter for general adult medical examination without abnormal findings: Secondary | ICD-10-CM

## 2015-07-20 DIAGNOSIS — R200 Anesthesia of skin: Secondary | ICD-10-CM

## 2015-07-20 DIAGNOSIS — E669 Obesity, unspecified: Secondary | ICD-10-CM

## 2015-07-20 DIAGNOSIS — Z1231 Encounter for screening mammogram for malignant neoplasm of breast: Secondary | ICD-10-CM | POA: Diagnosis not present

## 2015-07-20 NOTE — Progress Notes (Signed)
Patient ID: Vanessa Harvey, female   DOB: 1960-11-22, 55 y.o.   MRN: [id number]   Subjective:   Vanessa Harvey is a 55 y.o. female here for a complete physical exam  Interim issues since last visit: no major medical excitement  USPSTF grade A and B recommendations Alcohol: n/a Depression:  Depression screen [facility] 2/9 07/20/2015  Decreased Interest 0  Down, Depressed, Hopeless 0  PHQ - 2 Score 0   Hypertension: a little above normal today; usually 130s away from the doctor Obesity: consistency is a struggle, might do good for a few days; knows what to do Tobacco use: never smoker HIV, hep B, hep C: tested already for Hep B; pt politely declined the other STD testing and prevention (chl/gon/syphilis): no Lipids: today Glucose: today Colorectal cancer: father had colon cancer; last 2016-ish, checking Breast cancer: discussed, UTD BRCA gene screening: no ovarian or breast cancer Intimate partner violence: no Cervical cancer screening:  No abnormals; last normal was 2015 Lung cancer: n/a Osteoporosis: n/a Fall prevention/vitamin D: 1000 iu vit D AAA: n/a Aspirin: start Diet: almost typical american; limit eggs Exercise: every Tuesday night goes to the gym, trying to walk during lunch Skin cancer: no worrisome moles  Past Medical History  Diagnosis Date  . Allergic rhinitis, cause unspecified   . Anxiety state, unspecified   . Unspecified asthma(493.90)   . Lumbago   . Depressive disorder, not elsewhere classified   . Other atopic dermatitis and related conditions   . Panic disorder without agoraphobia   . Other B-complex deficiencies   . Degenerative disc disease, lumbar    Past Surgical History  Procedure Laterality Date  . Knee arthroscopy  1984    evulsion of femur s/p fall  . Cesarean section    . Dexa  06/2004  . Abd [place]  09/2005    Negative  . Ganglion cyst excision  12/2005    knee   Family History  Problem Relation Age of Onset  . Hypertension Father    Mild  . Ulcerative colitis Father   . Cancer Father     esophageal and colon  . Stroke Father   . COPD Father   . Diabetes Maternal Grandmother   . Heart disease Neg Hx    Social History  Substance Use Topics  . Smoking status: Never Smoker   . Smokeless tobacco: Never Used  . Alcohol Use: Yes     Comment: Rare   Review of Systems  Constitutional: Positive for fever (sick earlier).  HENT: Negative for hearing loss.   Eyes: Negative for visual disturbance.  Respiratory: Negative for shortness of breath and wheezing.   Cardiovascular: Negative for chest pain.  Gastrointestinal: Negative for blood in stool.  Endocrine: Positive for heat intolerance. Negative for cold intolerance, polydipsia and polyuria.  Genitourinary: Negative for hematuria.  Musculoskeletal: Positive for arthralgias (hands, worse with sewing).  Skin:       No worrisome moles  Allergic/Immunologic: Negative for food allergies (avoids shellfish, got food poisoning).  Neurological: Negative for tremors.  Hematological: Positive for adenopathy (swollen right lymph node, off and on for years). Does not bruise/bleed easily.  Psychiatric/Behavioral: Negative for dysphoric mood.    Objective:   Filed Vitals:   07/20/15 0806  BP: 142/76  Pulse: 85  Temp: 97.4 F (36.3 C)  Height: '5\' 5"'  (1.651 m)  Weight: 210 lb (95.255 kg)  SpO2: 97%   Body mass index is 34.95 kg/(m^2). Wt Readings from Last 3 Encounters:  07/20/15 210 lb (95.255 kg)  07/06/15 209 lb (94.802 kg)  06/29/13 210 lb 8 oz (95.482 kg)   Physical Exam  Constitutional: She appears well-developed and well-nourished.  HENT:  Head: Normocephalic and atraumatic.  Right Ear: Hearing, tympanic membrane, external ear and ear canal normal.  Left Ear: Hearing, tympanic membrane, external ear and ear canal normal.  Eyes: Conjunctivae and EOM are normal. Right eye exhibits no hordeolum. Left eye exhibits no hordeolum. No scleral icterus.  Neck: Carotid  bruit is not present. No thyromegaly present.  Cardiovascular: Normal rate, regular rhythm, S1 normal, S2 normal and normal heart sounds.   No extrasystoles are present.  Pulmonary/Chest: Effort normal and breath sounds normal. No respiratory distress. Right breast exhibits no inverted nipple, no mass, no nipple discharge, no skin change and no tenderness. Left breast exhibits no inverted nipple, no mass, no nipple discharge, no skin change and no tenderness. Breasts are symmetrical.  Abdominal: Soft. Normal appearance and bowel sounds are normal. She exhibits no distension, no abdominal bruit, no pulsatile midline mass and no mass. There is no hepatosplenomegaly. There is no tenderness. No hernia.  Musculoskeletal: Normal range of motion. She exhibits no edema.  Lymphadenopathy:       Head (right side): No submandibular adenopathy present.       Head (left side): No submandibular adenopathy present.    She has no cervical adenopathy.    She has no axillary adenopathy.  Neurological: She is alert. She displays no tremor. No cranial nerve deficit. She exhibits normal muscle tone. Gait normal.  Reflex Scores:      Patellar reflexes are 2+ on the right side and 2+ on the left side. Skin: Skin is warm and dry. No bruising and no ecchymosis noted. No cyanosis. No pallor.  Psychiatric: Her speech is normal and behavior is normal. Thought content normal. Her mood appears not anxious. She does not exhibit a depressed mood.    Assessment/Plan:   Problem List Items Addressed This Visit      Immune and Lymphatic   Submandibular lymphadenopathy    Refer to ENT for evaluation, chronic right sided sinus or ear infection possibly      Relevant Orders   Ambulatory referral to ENT     Other   Obesity    Weight loss encouraged; realize it's easier said than done      Numbness in right leg    Patient has not had the NCS/EMG ordered earlier; she decided to just cancel that; was not bothering her  enough; she'll try to lose weight      Preventative health care - Primary    USPSTF grade A and B recommendations reviewed with patient; age-appropriate recommendations, preventive care, screening tests, etc discussed and encouraged; healthy living encouraged; see AVS for patient education given to patient      Relevant Orders   CBC with Differential/Platelet (Completed)   Lipid Panel w/o Chol/HDL Ratio (Completed)   TSH (Completed)   Comprehensive metabolic panel (Completed)   Visit for screening mammogram    Order for screening mammogram; encouraged to be done yearly; CBE done, encouraged monthly SBE      Relevant Orders   MM SCREENING BREAST TOMO BILATERAL       No orders of the defined types were placed in this encounter.   Orders Placed This Encounter  Procedures  . MM SCREENING BREAST TOMO BILATERAL    Standing Status: Future     Number of Occurrences:  Standing Expiration Date: 09/18/2016    Order Specific Question:  Reason for Exam (SYMPTOM  OR DIAGNOSIS REQUIRED)    Answer:  yearly screening    Order Specific Question:  Is the patient pregnant?    Answer:  No    Order Specific Question:  Preferred imaging location?    Answer:  [facility]  . CBC with Differential/Platelet  . Lipid Panel w/o Chol/HDL Ratio    Order Specific Question:  Has the patient fasted?    Answer:  Yes  . TSH  . Comprehensive metabolic panel    Order Specific Question:  Has the patient fasted?    Answer:  Yes  . Ambulatory referral to ENT    Referral Priority:  Routine    Referral Type:  Consultation    Referral Reason:  Specialty Services Required    Requested Specialty:  Otolaryngology    Number of Visits Requested:  1    Follow up plan: Return in about 1 year (around 07/19/2016) for complete physical.   An after-visit summary was printed and given to the patient at [facility].  Please see the patient instructions which may contain other information and recommendations  beyond what is mentioned above in the assessment and plan.

## 2015-07-20 NOTE — Patient Instructions (Addendum)
Please do call to schedule your mammogram; the number to schedule one at either [facility] or [facility] is [phone] OR you may have this done wherever you choose  We'll see what your labs show today I've put in a referral to the [place] doctor If you have not heard anything from my staff in a week about any orders/referrals/studies from today, please contact us here to follow-up [phone]  Try turmeric as a natural anti-inflammatory (for pain and arthritis). It comes in capsules where you buy aspirin and fish oil, but also as a spice where you buy pepper and garlic powder.  DASH Eating Plan DASH stands for "Dietary Approaches to Stop Hypertension." The DASH eating plan is a healthy eating plan that has been shown to reduce high blood pressure (hypertension). Additional health benefits may include reducing the risk of type 2 diabetes mellitus, heart disease, and stroke. The DASH eating plan may also help with weight loss. WHAT DO I NEED TO KNOW ABOUT THE DASH EATING PLAN? For the DASH eating plan, you will follow these general guidelines:  Choose foods with a percent daily value for sodium of less than 5% (as listed on the food label).  Use salt-free seasonings or herbs instead of table salt or sea salt.  Check with your health care provider or pharmacist before using salt substitutes.  Eat lower-sodium products, often labeled as "lower sodium" or "no salt added."  Eat fresh foods.  Eat more vegetables, fruits, and low-fat dairy products.  Choose whole grains. Look for the word "whole" as the first word in the ingredient list.  Choose fish and skinless chicken or [place] more often than red meat. Limit fish, poultry, and meat to 6 oz (170 g) each day.  Limit sweets, desserts, sugars, and sugary drinks.  Choose heart-healthy fats.  Limit cheese to 1 oz (28 g) per day.  Eat more home-cooked food and less restaurant, buffet, and fast  food.  Limit fried foods.  Cook foods using methods other than frying.  Limit canned vegetables. If you do use them, rinse them well to decrease the sodium.  When eating at a restaurant, ask that your food be prepared with less salt, or no salt if possible. WHAT FOODS CAN I EAT? Seek help from a dietitian for individual calorie needs. Grains Whole grain or whole wheat bread. Brown rice. Whole grain or whole wheat pasta. Quinoa, bulgur, and whole grain cereals. Low-sodium cereals. Corn or whole wheat flour tortillas. Whole grain cornbread. Whole grain crackers. Low-sodium crackers. Vegetables Fresh or frozen vegetables (raw, steamed, roasted, or grilled). Low-sodium or reduced-sodium tomato and vegetable juices. Low-sodium or reduced-sodium tomato sauce and paste. Low-sodium or reduced-sodium canned vegetables.  Fruits All fresh, canned (in natural juice), or frozen fruits. Meat and Other Protein Products Ground beef (85% or leaner), grass-fed beef, or beef trimmed of fat. Skinless chicken or [place]. Ground chicken or [place]. Pork trimmed of fat. All fish and seafood. Eggs. Dried beans, peas, or lentils. Unsalted nuts and seeds. Unsalted canned beans. Dairy Low-fat dairy products, such as skim or 1% milk, 2% or reduced-fat cheeses, low-fat ricotta or cottage cheese, or plain low-fat yogurt. Low-sodium or reduced-sodium cheeses. Fats and Oils Tub margarines without trans fats. Light or reduced-fat mayonnaise and salad dressings (reduced sodium). Avocado. Safflower, olive, or canola oils. Natural peanut or almond butter. Other Unsalted popcorn and pretzels. The items listed above may not be a complete list of recommended foods or beverages.  Contact your dietitian for more options. WHAT FOODS ARE NOT RECOMMENDED? Grains White bread. White pasta. White rice. Refined cornbread. Bagels and croissants. Crackers that contain trans fat. Vegetables Creamed or fried vegetables. Vegetables in a  cheese sauce. Regular canned vegetables. Regular canned tomato sauce and paste. Regular tomato and vegetable juices. Fruits Dried fruits. Canned fruit in light or heavy syrup. Fruit juice. Meat and Other Protein Products Fatty cuts of meat. Ribs, chicken wings, bacon, sausage, bologna, salami, chitterlings, fatback, hot dogs, bratwurst, and packaged luncheon meats. Salted nuts and seeds. Canned beans with salt. Dairy Whole or 2% milk, cream, half-and-half, and cream cheese. Whole-fat or sweetened yogurt. Full-fat cheeses or blue cheese. Nondairy creamers and whipped toppings. Processed cheese, cheese spreads, or cheese curds. Condiments Onion and garlic salt, seasoned salt, table salt, and sea salt. Canned and packaged gravies. Worcestershire sauce. Tartar sauce. Barbecue sauce. Teriyaki sauce. Soy sauce, including reduced sodium. Steak sauce. Fish sauce. Oyster sauce. Cocktail sauce. Horseradish. Ketchup and mustard. Meat flavorings and tenderizers. Bouillon cubes. Hot sauce. Tabasco sauce. Marinades. Taco seasonings. Relishes. Fats and Oils Butter, stick margarine, lard, shortening, ghee, and bacon fat. Coconut, palm kernel, or palm oils. Regular salad dressings. Other Pickles and olives. Salted popcorn and pretzels. The items listed above may not be a complete list of foods and beverages to avoid. Contact your dietitian for more information. WHERE CAN I FIND MORE INFORMATION? National Heart, Lung, and Blood Institute: travelstabloid.com   This information is not intended to replace advice given to you by your health care provider. Make sure you discuss any questions you have with your health care provider.   Document Released: 04/12/2011 Document Revised: 05/14/2014 Document Reviewed: 02/25/2013 Elsevier Interactive Patient Education 2016 [place] Maintenance, Female Adopting a healthy lifestyle and getting preventive care can go a long way to  promote health and wellness. Talk with your health care provider about what schedule of regular examinations is right for you. This is a good chance for you to check in with your provider about disease prevention and staying healthy. In between checkups, there are plenty of things you can do on your own. Experts have done a lot of research about which lifestyle changes and preventive measures are most likely to keep you healthy. Ask your health care provider for more information. WEIGHT AND DIET  Eat a healthy diet  Be sure to include plenty of vegetables, fruits, low-fat dairy products, and lean protein.  Do not eat a lot of foods high in solid fats, added sugars, or salt.  Get regular exercise. This is one of the most important things you can do for your health.  Most adults should exercise for at least 150 minutes each week. The exercise should increase your heart rate and make you sweat (moderate-intensity exercise).  Most adults should also do strengthening exercises at least twice a week. This is in addition to the moderate-intensity exercise.  Maintain a healthy weight  Body mass index (BMI) is a measurement that can be used to identify possible weight problems. It estimates body fat based on height and weight. Your health care provider can help determine your BMI and help you achieve or maintain a healthy weight.  For females 35 years of age and older:   A BMI below 18.5 is considered underweight.  A BMI of 18.5 to 24.9 is normal.  A BMI of 25 to 29.9 is considered overweight.  A BMI of 30 and above is considered obese.  Watch levels of  cholesterol and blood lipids  You should start having your blood tested for lipids and cholesterol at 57 years of age, then have this test every 5 years.  You may need to have your cholesterol levels checked more often if:  Your lipid or cholesterol levels are high.  You are older than 57 years of age.  You are at high risk for heart  disease.  CANCER SCREENING   Lung Cancer  Lung cancer screening is recommended for adults 67-67 years old who are at high risk for lung cancer because of a history of smoking.  A yearly low-dose CT scan of the lungs is recommended for people who:  Currently smoke.  Have quit within the past 15 years.  Have at least a 30-pack-year history of smoking. A pack year is smoking an average of one pack of cigarettes a day for 1 year.  Yearly screening should continue until it has been 15 years since you quit.  Yearly screening should stop if you develop a health problem that would prevent you from having lung cancer treatment.  Breast Cancer  Practice breast self-awareness. This means understanding how your breasts normally appear and feel.  It also means doing regular breast self-exams. Let your health care provider know about any changes, no matter how small.  If you are in your 20s or 30s, you should have a clinical breast exam (CBE) by a health care provider every 1-3 years as part of a regular health exam.  If you are 78 or older, have a CBE every year. Also consider having a breast X-ray (mammogram) every year.  If you have a family history of breast cancer, talk to your health care provider about genetic screening.  If you are at high risk for breast cancer, talk to your health care provider about having an MRI and a mammogram every year.  Breast cancer gene (BRCA) assessment is recommended for women who have family members with BRCA-related cancers. BRCA-related cancers include:  Breast.  Ovarian.  Tubal.  Peritoneal cancers.  Results of the assessment will determine the need for genetic counseling and BRCA1 and BRCA2 testing. Cervical Cancer Your health care provider may recommend that you be screened regularly for cancer of the pelvic organs (ovaries, uterus, and vagina). This screening involves a pelvic examination, including checking for microscopic changes to the  surface of your cervix (Pap test). You may be encouraged to have this screening done every 3 years, beginning at age 55.  For women ages 13-65, health care providers may recommend pelvic exams and Pap testing every 3 years, or they may recommend the Pap and pelvic exam, combined with testing for human papilloma virus (HPV), every 5 years. Some types of HPV increase your risk of cervical cancer. Testing for HPV may also be done on women of any age with unclear Pap test results.  Other health care providers may not recommend any screening for nonpregnant women who are considered low risk for pelvic cancer and who do not have symptoms. Ask your health care provider if a screening pelvic exam is right for you.  If you have had past treatment for cervical cancer or a condition that could lead to cancer, you need Pap tests and screening for cancer for at least 20 years after your treatment. If Pap tests have been discontinued, your risk factors (such as having a new sexual partner) need to be reassessed to determine if screening should resume. Some women have medical problems that increase the chance  of getting cervical cancer. In these cases, your health care provider may recommend more frequent screening and Pap tests. Colorectal Cancer  This type of cancer can be detected and often prevented.  Routine colorectal cancer screening usually begins at 57 years of age and continues through 57 years of age.  Your health care provider may recommend screening at an earlier age if you have risk factors for colon cancer.  Your health care provider may also recommend using home test kits to check for hidden blood in the stool.  A small camera at the end of a tube can be used to examine your colon directly (sigmoidoscopy or colonoscopy). This is done to check for the earliest forms of colorectal cancer.  Routine screening usually begins at age 37.  Direct examination of the colon should be repeated every 5-10  years through 57 years of age. However, you may need to be screened more often if early forms of precancerous polyps or small growths are found. Skin Cancer  Check your skin from head to toe regularly.  Tell your health care provider about any new moles or changes in moles, especially if there is a change in a mole's shape or color.  Also tell your health care provider if you have a mole that is larger than the size of a pencil eraser.  Always use sunscreen. Apply sunscreen liberally and repeatedly throughout the day.  Protect yourself by wearing long sleeves, pants, a wide-brimmed hat, and sunglasses whenever you are outside. HEART DISEASE, DIABETES, AND HIGH BLOOD PRESSURE   High blood pressure causes heart disease and increases the risk of stroke. High blood pressure is more likely to develop in:  People who have blood pressure in the high end of the normal range (130-139/85-89 mm Hg).  People who are overweight or obese.  People who are African American.  If you are 67-39 years of age, have your blood pressure checked every 3-5 years. If you are 37 years of age or older, have your blood pressure checked every year. You should have your blood pressure measured twice--once when you are at a hospital or clinic, and once when you are not at a hospital or clinic. Record the average of the two measurements. To check your blood pressure when you are not at a hospital or clinic, you can use:  An automated blood pressure machine at a pharmacy.  A home blood pressure monitor.  If you are between 28 years and 54 years old, ask your health care provider if you should take aspirin to prevent strokes.  Have regular diabetes screenings. This involves taking a blood sample to check your fasting blood sugar level.  If you are at a normal weight and have a low risk for diabetes, have this test once every three years after 57 years of age.  If you are overweight and have a high risk for diabetes,  consider being tested at a younger age or more often. PREVENTING INFECTION  Hepatitis B  If you have a higher risk for hepatitis B, you should be screened for this virus. You are considered at high risk for hepatitis B if:  You were born in a country where hepatitis B is common. Ask your health care provider which countries are considered high risk.  Your parents were born in a high-risk country, and you have not been immunized against hepatitis B (hepatitis B vaccine).  You have HIV or AIDS.  You use needles to inject street drugs.  You  live with someone who has hepatitis B.  You have had sex with someone who has hepatitis B.  You get hemodialysis treatment.  You take certain medicines for conditions, including cancer, organ transplantation, and autoimmune conditions. Hepatitis C  Blood testing is recommended for:  Everyone born from [birth year] through 1967.  Anyone with known risk factors for hepatitis C. Sexually transmitted infections (STIs)  You should be screened for sexually transmitted infections (STIs) including gonorrhea and chlamydia if:  You are sexually active and are younger than 57 years of age.  You are older than 57 years of age and your health care provider tells you that you are at risk for this type of infection.  Your sexual activity has changed since you were last screened and you are at an increased risk for chlamydia or gonorrhea. Ask your health care provider if you are at risk.  If you do not have HIV, but are at risk, it may be recommended that you take a prescription medicine daily to prevent HIV infection. This is called pre-exposure prophylaxis (PrEP). You are considered at risk if:  You are sexually active and do not regularly use condoms or know the HIV status of your partner(s).  You take drugs by injection.  You are sexually active with a partner who has HIV. Talk with your health care provider about whether you are at high risk of being  infected with HIV. If you choose to begin PrEP, you should first be tested for HIV. You should then be tested every 3 months for as long as you are taking PrEP.  PREGNANCY   If you are premenopausal and you may become pregnant, ask your health care provider about preconception counseling.  If you may become pregnant, take 400 to 800 micrograms (mcg) of folic acid every day.  If you want to prevent pregnancy, talk to your health care provider about birth control (contraception). OSTEOPOROSIS AND MENOPAUSE   Osteoporosis is a disease in which the bones lose minerals and strength with aging. This can result in serious bone fractures. Your risk for osteoporosis can be identified using a bone density scan.  If you are 55 years of age or older, or if you are at risk for osteoporosis and fractures, ask your health care provider if you should be screened.  Ask your health care provider whether you should take a calcium or vitamin D supplement to lower your risk for osteoporosis.  Menopause may have certain physical symptoms and risks.  Hormone replacement therapy may reduce some of these symptoms and risks. Talk to your health care provider about whether hormone replacement therapy is right for you.  HOME CARE INSTRUCTIONS   Schedule regular health, dental, and eye exams.  Stay current with your immunizations.   Do not use any tobacco products including cigarettes, chewing tobacco, or electronic cigarettes.  If you are pregnant, do not drink alcohol.  If you are breastfeeding, limit how much and how often you drink alcohol.  Limit alcohol intake to no more than 1 drink per day for nonpregnant women. One drink equals 12 ounces of beer, 5 ounces of wine, or 1 ounces of hard liquor.  Do not use street drugs.  Do not share needles.  Ask your health care provider for help if you need support or information about quitting drugs.  Tell your health care provider if you often feel  depressed.  Tell your health care provider if you have ever been abused or do not feel safe  at home.   This information is not intended to replace advice given to you by your health care provider. Make sure you discuss any questions you have with your health care provider.   Document Released: 11/06/2010 Document Revised: 05/14/2014 Document Reviewed: 03/25/2013 Elsevier Interactive Patient Education 2016 [employer]vier Inc[employer]  

## 2015-07-20 NOTE — Assessment & Plan Note (Signed)
Refer to ENT for evaluation, chronic right sided sinus or ear infection possibly

## 2015-07-21 ENCOUNTER — Encounter: Payer: Self-pay | Admitting: Family Medicine

## 2015-07-21 DIAGNOSIS — Z0000 Encounter for general adult medical examination without abnormal findings: Secondary | ICD-10-CM | POA: Insufficient documentation

## 2015-07-21 DIAGNOSIS — Z1231 Encounter for screening mammogram for malignant neoplasm of breast: Secondary | ICD-10-CM | POA: Insufficient documentation

## 2015-07-21 LAB — COMPREHENSIVE METABOLIC PANEL
A/G RATIO: 1.6 (ref 1.2–2.2)
ALBUMIN: 4.2 g/dL (ref 3.5–5.5)
ALK PHOS: 89 IU/L (ref 39–117)
ALT: 12 IU/L (ref 0–32)
AST: 18 IU/L (ref 0–40)
BILIRUBIN TOTAL: 0.2 mg/dL (ref 0.0–1.2)
BUN / CREAT RATIO: 14 (ref 9–23)
BUN: 10 mg/dL (ref 6–24)
CHLORIDE: 101 mmol/L (ref 96–106)
CO2: 25 mmol/L (ref 18–29)
Calcium: 9.4 mg/dL (ref 8.7–10.2)
Creatinine, Ser: 0.73 mg/dL (ref 0.57–1.00)
GFR calc non Af Amer: 92 mL/min/{1.73_m2} (ref >59)
GFR, EST AFRICAN AMERICAN: 106 mL/min/{1.73_m2} (ref >59)
GLOBULIN, TOTAL: 2.6 g/dL (ref 1.5–4.5)
GLUCOSE: 92 mg/dL (ref 65–99)
POTASSIUM: 4.5 mmol/L (ref 3.5–5.2)
SODIUM: 142 mmol/L (ref 134–144)
TOTAL PROTEIN: 6.8 g/dL (ref 6.0–8.5)

## 2015-07-21 LAB — LIPID PANEL W/O CHOL/HDL RATIO
Cholesterol, Total: 164 mg/dL (ref 100–199)
HDL: 46 mg/dL (ref >39)
LDL Calculated: 101 mg/dL — ABNORMAL HIGH (ref 0–99)
TRIGLYCERIDES: 84 mg/dL (ref 0–149)
VLDL CHOLESTEROL CAL: 17 mg/dL (ref 5–40)

## 2015-07-21 LAB — CBC WITH DIFFERENTIAL/PLATELET
BASOS: 0 %
Basophils Absolute: 0 10*3/uL (ref 0.0–0.2)
EOS (ABSOLUTE): 0.2 10*3/uL (ref 0.0–0.4)
EOS: 3 %
Hematocrit: 36.4 % (ref 34.0–46.6)
Hemoglobin: 11.7 g/dL (ref 11.1–15.9)
IMMATURE GRANS (ABS): 0 10*3/uL (ref 0.0–0.1)
IMMATURE GRANULOCYTES: 0 %
LYMPHS: 36 %
Lymphocytes Absolute: 2.7 10*3/uL (ref 0.7–3.1)
MCH: 25.5 pg — ABNORMAL LOW (ref 26.6–33.0)
MCHC: 32.1 g/dL (ref 31.5–35.7)
MCV: 80 fL (ref 79–97)
Monocytes Absolute: 0.8 10*3/uL (ref 0.1–0.9)
Monocytes: 11 %
NEUTROS PCT: 50 %
Neutrophils Absolute: 3.8 10*3/uL (ref 1.4–7.0)
PLATELETS: 373 10*3/uL (ref 150–379)
RBC: 4.58 x10E6/uL (ref 3.77–5.28)
RDW: 15.6 % — ABNORMAL HIGH (ref 12.3–15.4)
WBC: 7.7 10*3/uL (ref 3.4–10.8)

## 2015-07-21 LAB — TSH: TSH: 1.44 u[IU]/mL (ref 0.450–4.500)

## 2015-07-21 NOTE — Assessment & Plan Note (Signed)
Patient has not had the NCS/EMG ordered earlier; she decided to just cancel that; was not bothering her enough; she'll try to lose weight

## 2015-07-21 NOTE — Assessment & Plan Note (Signed)
Order for screening mammogram; encouraged to be done yearly; CBE done, encouraged monthly SBE

## 2015-07-21 NOTE — Assessment & Plan Note (Signed)
Weight loss encouraged; realize it's easier said than done

## 2015-07-21 NOTE — Assessment & Plan Note (Signed)
USPSTF grade A and B recommendations reviewed with patient; age-appropriate recommendations, preventive care, screening tests, etc discussed and encouraged; healthy living encouraged; see AVS for patient education given to patient  

## 2015-08-01 ENCOUNTER — Other Ambulatory Visit: Payer: Self-pay | Admitting: Family Medicine

## 2015-10-09 ENCOUNTER — Other Ambulatory Visit: Payer: Self-pay | Admitting: Family Medicine

## 2015-10-10 ENCOUNTER — Other Ambulatory Visit: Payer: Self-pay

## 2015-10-10 MED ORDER — ALBUTEROL SULFATE HFA 108 (90 BASE) MCG/ACT IN AERS: 2.0000 | INHALATION_SPRAY | RESPIRATORY_TRACT | Status: DC | PRN

## 2015-11-18 LAB — HM MAMMOGRAPHY: HM Mammogram: NORMAL (ref 0–4)

## 2015-11-29 ENCOUNTER — Encounter: Payer: Self-pay | Admitting: Family Medicine

## 2015-11-29 ENCOUNTER — Ambulatory Visit (INDEPENDENT_AMBULATORY_CARE_PROVIDER_SITE_OTHER): Payer: Commercial Managed Care - HMO | Admitting: Family Medicine

## 2015-11-29 DIAGNOSIS — R208 Other disturbances of skin sensation: Secondary | ICD-10-CM | POA: Diagnosis not present

## 2015-11-29 DIAGNOSIS — K648 Other hemorrhoids: Secondary | ICD-10-CM | POA: Diagnosis not present

## 2015-11-29 DIAGNOSIS — R200 Anesthesia of skin: Secondary | ICD-10-CM

## 2015-11-29 DIAGNOSIS — J0110 Acute frontal sinusitis, unspecified: Secondary | ICD-10-CM | POA: Insufficient documentation

## 2015-11-29 DIAGNOSIS — R41840 Attention and concentration deficit: Secondary | ICD-10-CM | POA: Diagnosis not present

## 2015-11-29 HISTORY — DX: Anesthesia of skin: R20.0

## 2015-11-29 MED ORDER — HYDROCORTISONE ACETATE 25 MG RE SUPP: 25.0000 mg | Freq: Two times a day (BID) | RECTAL | 0 refills | Status: DC

## 2015-11-29 MED ORDER — SULFAMETHOXAZOLE-TRIMETHOPRIM 800-160 MG PO TABS: 1.0000 | ORAL_TABLET | Freq: Two times a day (BID) | ORAL | 0 refills | Status: AC

## 2015-11-29 NOTE — Assessment & Plan Note (Addendum)
Refer to Dr. Mitzie Na for testing; advised patient to never take another person's medicine; explained too that stimulants can increase risk of heart attack and stroke

## 2015-11-29 NOTE — Assessment & Plan Note (Signed)
Discussed ddx with patient; no ocular symptoms; doubt MS; no carotid bruit, no amaurosis fugax; discussed work -up which could include neuro referral, ENT referral, sinus CT, brain imaging, treatment with antibiotics for possible sinus infection; she opted for antibiotics and we'll go from there; discussed s/s of stroke, reasons to call 911, explained time limit of 3-4.5 hours from onset of symptoms; start aspirin 81 mg daily too; she agrees with plan

## 2015-11-29 NOTE — Patient Instructions (Addendum)
Start the antibiotics Please do eat yogurt daily or take a probiotic daily for the next month or two We want to replace the healthy germs in the gut If you notice foul, watery diarrhea in the next two months, schedule an appointment RIGHT AWAY Start coated baby 81 mg aspirin daily Take aspirin at least one hour prior to any ibuprofen or motrin or aleve Ibuprofen and motrin and aleve can increase risk of heart attack and stroke If you develop signs or symptoms of stroke, call 911 If symptoms persist or new ones develop, call me and we'll do further testing or refer you to a specialist I've put in a referral to Dr. Maggie Font and I'll see you back after his testing is complete Be aware that the medicines we use for ADHD in and of themselves can increase the risk of heart attack and stroke  Your goal blood pressure is less than 140 mmHg on top. Try to follow the DASH guidelines (DASH stands for Dietary Approaches to Stop Hypertension) Try to limit the sodium in your diet.  Ideally, consume less than 1.5 grams (less than 1,500mg ) per day. Do not add salt when cooking or at the table.  Check the sodium amount on labels when shopping, and choose items lower in sodium when given a choice. Avoid or limit foods that already contain a lot of sodium. Eat a diet rich in fruits and vegetables and whole grains.

## 2015-11-29 NOTE — Assessment & Plan Note (Signed)
rx for anusol per patient request

## 2015-11-29 NOTE — Progress Notes (Signed)
BP 140/78   Pulse 84   Temp 98.3 F (36.8 C) (Oral)   Resp 14   Wt 214 lb (97.1 kg)   LMP 05/07/2009   SpO2 96%   BMI 35.61 kg/m    Subjective:    Patient ID: Vanessa Harvey, female    DOB: 1960-10-29, 55 y.o.   MRN: [id number]  HPI: Vanessa Harvey is a 55 y.o. female  Chief Complaint  Patient presents with  . facial numbness    right side  . Hemorrhoids  . ADHD    wants to see about getting on medication   She has numbness on her right side of face, mildly for a few months; last Tuesday it more noticeable, nasolabial fold was flattened last Tuesday Did a panorex at work and nothing related to teeth Her father had ministrokes; his carotid artery was causing visual symptoms, amaurosis fugax per her description, and had to have it cleaned out; just the right side; he was a smoker; no high cholesterol Patient denies any visual changes; denies changes of smell or taste; her nose is clear but she has a lot of drainage down the back; patient's husband can smell an odor of pickles when she blows air out of her nose; just has postnasal drainage No fevers; no sore throat, no swollen glands; no night sweats or unexplained weight loss She did have some sensation in the lateral and anterior aspect of her right leg; pure sensory No family hx of multiple sclerosis; saw antibiotics  Possible ADHD; symptoms present for as long as she can remember; son diagnosed with it; patient noticed benefit from stimulant; has not been officially tested; no problems with hearing; not depressed, gets anxious, thinks it's genetic, taking prozac for anxiety  Internal hemorrhoid; one week; can feel it; would like RX; going on since childbirth  Depression screen [facility] 2/9 11/29/2015 07/20/2015  Decreased Interest 0 0  Down, Depressed, Hopeless 0 0  PHQ - 2 Score 0 0   Relevant past medical, surgical, family and social history reviewed Past Medical History:  Diagnosis Date  . Allergic rhinitis, cause  unspecified   . Anxiety state, unspecified   . Degenerative disc disease, lumbar   . Depressive disorder, not elsewhere classified   . Lumbago   . Other atopic dermatitis and related conditions   . Other B-complex deficiencies   . Panic disorder without agoraphobia   . Unspecified asthma(493.90)    Past Surgical History:  Procedure Laterality Date  . Abd [place]  09/2005   Negative  . CESAREAN SECTION    . DEXA  06/2004  . GANGLION CYST EXCISION  12/2005   knee  . KNEE ARTHROSCOPY  1984   evulsion of femur s/p fall   Family History  Problem Relation Age of Onset  . Hypertension Father     Mild  . Ulcerative colitis Father   . Cancer Father     esophageal and colon  . Stroke Father   . COPD Father   . Diabetes Maternal Grandmother   . Heart disease Neg Hx    Social History  Substance Use Topics  . Smoking status: Never Smoker  . Smokeless tobacco: Never Used  . Alcohol use Yes     Comment: Rare    Interim medical history since last visit reviewed. Allergies and medications reviewed  Review of Systems Per HPI unless specifically indicated above     Objective:    BP 140/78   Pulse 84  Temp 98.3 F (36.8 C) (Oral)   Resp 14   Wt 214 lb (97.1 kg)   LMP 05/07/2009   SpO2 96%   BMI 35.61 kg/m   Wt Readings from Last 3 Encounters:  11/29/15 214 lb (97.1 kg)  07/20/15 210 lb (95.3 kg)  07/06/15 209 lb (94.8 kg)    Physical Exam  Constitutional: She appears well-developed and well-nourished. No distress.  HENT:  Head: Normocephalic and atraumatic.  Right Ear: Hearing, tympanic membrane, external ear and ear canal normal. Tympanic membrane is not erythematous. No middle ear effusion.  Left Ear: Hearing, tympanic membrane, external ear and ear canal normal. Tympanic membrane is not erythematous.  No middle ear effusion.  Nose: Mucosal edema and rhinorrhea present. Right sinus exhibits maxillary sinus tenderness. Right sinus exhibits no frontal sinus tenderness.  Left sinus exhibits no maxillary sinus tenderness and no frontal sinus tenderness.  Mouth/Throat: Oropharynx is clear and moist and mucous membranes are normal. No oral lesions. Normal dentition.  Right side nasal mucosa rather erythematous  Eyes: EOM are normal. No scleral icterus.  Neck: No thyromegaly present.  Cardiovascular: Normal rate, regular rhythm and normal heart sounds.   No murmur heard. Pulmonary/Chest: Effort normal and breath sounds normal. No respiratory distress. She has no wheezes.  Abdominal: Soft. Bowel sounds are normal. She exhibits no distension.  Musculoskeletal: Normal range of motion. She exhibits no edema.  Neurological: She is alert. She displays no atrophy and no tremor. No cranial nerve deficit or sensory deficit. She exhibits normal muscle tone. Coordination normal.  UE and LE strength 5/5  Skin: Skin is warm and dry. No rash noted. She is not diaphoretic. No pallor.  Psychiatric: She has a normal mood and affect. Her behavior is normal. Judgment and thought content normal. Her mood appears not anxious. She does not exhibit a depressed mood.    Results for orders placed or performed in visit on 11/21/15  HM MAMMOGRAPHY  Result Value Ref Range   HM Mammogram Self Reported Normal 0-4 Bi-Rad, Self Reported Normal      Assessment & Plan:   Problem List Items Addressed This Visit      Cardiovascular and Mediastinum   Internal hemorrhoid    rx for anusol per patient request        Respiratory   Sinusitis, acute frontal    Discussed ddx, offered multiple things and we'll start with antibiotics; can follow with CT sinus, ENT referral, neuro referral, brain CT, carotids (but no bruit); discuses s/s of stroke      Relevant Medications   sulfamethoxazole-trimethoprim (BACTRIM DS,SEPTRA DS) 800-160 MG tablet     Other   Right facial numbness    Discussed ddx with patient; no ocular symptoms; doubt MS; no carotid bruit, no amaurosis fugax; discussed work  -up which could include neuro referral, ENT referral, sinus CT, brain imaging, treatment with antibiotics for possible sinus infection; she opted for antibiotics and we'll go from there; discussed s/s of stroke, reasons to call 911, explained time limit of 3-4.5 hours from onset of symptoms; start aspirin 81 mg daily too; she agrees with plan      Poor concentration    Refer to Dr. Mitzie Na for testing; advised patient to never take another person's medicine; explained too that stimulants can increase risk of heart attack and stroke      Relevant Orders   Ambulatory referral to Psychology    Other Visit Diagnoses   None.     Follow  up plan: No Follow-up on file.  An after-visit summary was printed and given to the patient at [place].  Please see the patient instructions which may contain other information and recommendations beyond what is mentioned above in the assessment and plan.  Meds ordered this encounter  Medications  . sulfamethoxazole-trimethoprim (BACTRIM DS,SEPTRA DS) 800-160 MG tablet    Sig: Take 1 tablet by mouth 2 (two) times daily.    Dispense:  20 tablet    Refill:  0  . hydrocortisone (ANUSOL-HC) 25 MG suppository    Sig: Place 1 suppository (25 mg total) rectally 2 (two) times daily.    Dispense:  12 suppository    Refill:  0    Orders Placed This Encounter  Procedures  . Ambulatory referral to Psychology

## 2015-11-29 NOTE — Assessment & Plan Note (Signed)
Discussed ddx, offered multiple things and we'll start with antibiotics; can follow with CT sinus, ENT referral, neuro referral, brain CT, carotids (but no bruit); discuses s/s of stroke

## 2015-12-06 ENCOUNTER — Telehealth: Payer: Self-pay | Admitting: Family Medicine

## 2015-12-06 DIAGNOSIS — R41840 Attention and concentration deficit: Secondary | ICD-10-CM

## 2015-12-06 NOTE — Assessment & Plan Note (Signed)
Refer to PhD

## 2015-12-06 NOTE — Telephone Encounter (Signed)
New referral entered for Dr. Maggie Font

## 2015-12-20 ENCOUNTER — Telehealth: Payer: Self-pay | Admitting: Family Medicine

## 2015-12-20 DIAGNOSIS — H9209 Otalgia, unspecified ear: Secondary | ICD-10-CM

## 2015-12-20 NOTE — Telephone Encounter (Signed)
entered

## 2015-12-20 NOTE — Assessment & Plan Note (Signed)
Refer to ENT

## 2016-01-16 ENCOUNTER — Other Ambulatory Visit: Payer: Self-pay | Admitting: Unknown Physician Specialty

## 2016-01-16 DIAGNOSIS — M792 Neuralgia and neuritis, unspecified: Secondary | ICD-10-CM

## 2016-02-03 ENCOUNTER — Ambulatory Visit
Admission: RE | Admit: 2016-02-03 | Discharge: 2016-02-03 | Disposition: A | Discharge status: Home / Self Care | Payer: 59 | Source: Ambulatory Visit | Attending: Unknown Physician Specialty | Admitting: Unknown Physician Specialty

## 2016-02-03 DIAGNOSIS — M792 Neuralgia and neuritis, unspecified: Secondary | ICD-10-CM | POA: Diagnosis present

## 2016-04-26 ENCOUNTER — Other Ambulatory Visit: Payer: Self-pay | Admitting: Family Medicine

## 2016-05-01 ENCOUNTER — Other Ambulatory Visit: Payer: Self-pay | Admitting: Family Medicine

## 2016-05-01 NOTE — Telephone Encounter (Signed)
Patient is requesting a refill on fluoxetine HCL 10 mg tablet

## 2016-05-01 NOTE — Telephone Encounter (Signed)
Pt is requesting refill on fluoxetine. Please send to [facility]

## 2016-05-09 NOTE — Telephone Encounter (Signed)
Glitch in computer system, I was not receiving refill requests electronically; system corrected by IT yesterday; addressing refills now ---------------------------- rx filled in dec

## 2016-11-13 ENCOUNTER — Other Ambulatory Visit: Payer: Self-pay

## 2016-11-13 MED ORDER — MONTELUKAST SODIUM 10 MG PO TABS: 10.0000 mg | ORAL_TABLET | Freq: Every day | ORAL | 0 refills | Status: DC

## 2016-12-12 ENCOUNTER — Ambulatory Visit (INDEPENDENT_AMBULATORY_CARE_PROVIDER_SITE_OTHER): Payer: 59 | Admitting: Family Medicine

## 2016-12-12 ENCOUNTER — Encounter: Payer: Self-pay | Admitting: Family Medicine

## 2016-12-12 DIAGNOSIS — Z823 Family history of stroke: Secondary | ICD-10-CM | POA: Diagnosis not present

## 2016-12-12 DIAGNOSIS — K219 Gastro-esophageal reflux disease without esophagitis: Secondary | ICD-10-CM

## 2016-12-12 DIAGNOSIS — N6452 Nipple discharge: Secondary | ICD-10-CM | POA: Diagnosis not present

## 2016-12-12 HISTORY — DX: Nipple discharge: N64.52

## 2016-12-12 HISTORY — DX: Family history of stroke: Z82.3

## 2016-12-12 NOTE — Progress Notes (Signed)
BP 132/68   Pulse 81   Temp 98.4 F (36.9 C) (Oral)   Resp 14   Wt 213 lb 14.4 oz (97 kg)   LMP 05/07/2009   SpO2 95%   BMI 35.59 kg/m    Subjective:    Patient ID: Vanessa Harvey, female    DOB: 1961/01/14, 56 y.o.   MRN: [id number]  HPI: Vanessa Harvey is a 56 y.o. female  Chief Complaint  Patient presents with  . Breast Problem    Discharge from the right breast saturday   . Nasal Congestion    sinus and chest congestion     HPI Patient is here for an acute visit  She noticed discharge from her RIGHT breast on Saturday, discharge came out when pressed on; no tenderness; just felt like moisture, then didn't mess with it on Sunday, then touched tissue to it on Monday, yellowish discharge; no pain Nothing abnormal about the LEFT breast Patient has fibrocystic breast changes, mother does too; no previous breast biopsies in patient LMP roughly around age 66; took OCP; no HRT after menopause One child born after the age 76  Nexium, went down from 40 mg to 20 mg and now just rarely, every 3 days; no Barrett's esophagus; they had tried several different medicines at first; that is the first thing that worked;   Using [employer]; spring and fall worst time  She just had a little summer cold; grandson was sick last week; woke up with some crud; no fever  She was sitting there on Saturday, poor sleep the night before b/c grandson was up sick; then everything went grey when watching TV, then a roll of heat when through her body; not skipped meals; maybe not enough hydration; no chest pain; no tingling or weakness or numbness of extremity or face; attributed to stress or exhaustion  Depression screen [facility] 2/9 12/12/2016 11/29/2015 07/20/2015  Decreased Interest 0 0 0  Down, Depressed, Hopeless 0 0 0  PHQ - 2 Score 0 0 0   Relevant past medical, surgical, family and social history reviewed Past Medical History:  Diagnosis Date  . Allergic rhinitis, cause unspecified     . Anxiety state, unspecified   . Degenerative disc disease, lumbar   . Depressive disorder, not elsewhere classified   . Lumbago   . Other atopic dermatitis and related conditions   . Other B-complex deficiencies   . Panic disorder without agoraphobia   . Unspecified asthma(493.90)    Past Surgical History:  Procedure Laterality Date  . Abd [place]  09/2005   Negative  . CESAREAN SECTION    . DEXA  06/2004  . GANGLION CYST EXCISION  12/2005   knee  . KNEE ARTHROSCOPY  1984   evulsion of femur s/p fall   Family History  Problem Relation Age of Onset  . Hypertension Father        Mild  . Ulcerative colitis Father   . Cancer Father        esophageal and colon  . Stroke Father   . COPD Father   . Diabetes Maternal Grandmother   . Heart disease Neg Hx    Social History   Social History  . Marital status: Married    Spouse name: N/A  . Number of children: 2  . Years of education: N/A   Occupational History  . Dental Assistant    Social History Main Topics  . Smoking status: Never Smoker  . Smokeless tobacco:  Never Used  . Alcohol use Yes     Comment: Rare  . Drug use: No  . Sexual activity: Yes   Other Topics Concern  . Not on file   Social History Narrative   Exercise-yes    Interim medical history since last visit reviewed. Allergies and medications reviewed  Review of Systems Per HPI unless specifically indicated above     Objective:    BP 132/68   Pulse 81   Temp 98.4 F (36.9 C) (Oral)   Resp 14   Wt 213 lb 14.4 oz (97 kg)   LMP 05/07/2009   SpO2 95%   BMI 35.59 kg/m   Wt Readings from Last 3 Encounters:  12/12/16 213 lb 14.4 oz (97 kg)  11/29/15 214 lb (97.1 kg)  07/20/15 210 lb (95.3 kg)    Physical Exam  Constitutional: She appears well-developed and well-nourished.  HENT:  Right Ear: Tympanic membrane and ear canal normal. No middle ear effusion.  Left Ear: Tympanic membrane and ear canal normal.  No middle ear effusion.  Nose:  Rhinorrhea present.  Mouth/Throat: Oropharynx is clear and moist and mucous membranes are normal.  Eyes: EOM are normal. No scleral icterus.  Neck: Carotid bruit is not present.  Cardiovascular: Normal rate and regular rhythm.   Pulmonary/Chest: Effort normal and breath sounds normal. Right breast exhibits mass (upper outer quadrant). Right breast exhibits no inverted nipple, no nipple discharge, no skin change and no tenderness. Left breast exhibits no inverted nipple, no mass, no nipple discharge, no skin change and no tenderness. Breasts are symmetrical.    Lymphadenopathy:    She has no cervical adenopathy.  Psychiatric: She has a normal mood and affect. Her behavior is normal. Her mood appears not anxious. She does not exhibit a depressed mood.    Results for orders placed or performed in visit on 11/21/15  HM MAMMOGRAPHY  Result Value Ref Range   HM Mammogram Self Reported Normal 0-4 Bi-Rad, Self Reported Normal      Assessment & Plan:   Problem List Items Addressed This Visit      Digestive   GERD (gastroesophageal reflux disease)    Patient will use PPI sparingly; see AVS      Relevant Medications   esomeprazole (NEXIUM) 20 MG capsule     Other   Family history of stroke    No carotid bruits; may have been dehydrated, stressed; offered carotid US, patient politely declined; start baby aspirin (notify me if worsens the GERD)      Breast discharge    Will get diagnostic mammo and [place] through [facility]      Relevant Orders   MM DIAG BREAST TOMO BILATERAL   US BREAST LTD UNI RIGHT INC AXILLA       Follow up plan: No Follow-up on file.  An after-visit summary was printed and given to the patient at [place].  Please see the patient instructions which may contain other information and recommendations beyond what is mentioned above in the assessment and plan.  Meds ordered this encounter  Medications  . DISCONTD: esomeprazole (NEXIUM) 40 MG capsule    Sig: Take 20 mg  by mouth as needed.  Marland Kitchen FLUoxetine (PROZAC) 20 MG capsule    Sig: Take 1 capsule by mouth daily.    Refill:  0  . Calcium-Magnesium-Zinc 333-133-5 MG TABS    Sig: Take 1 tablet by mouth once a week.  . esomeprazole (NEXIUM) 20 MG capsule    Sig:  Take 1 capsule (20 mg total) by mouth daily as needed.  Marland Kitchen aspirin EC 81 MG tablet    Sig: Take 1 tablet (81 mg total) by mouth daily.    Orders Placed This Encounter  Procedures  . MM DIAG BREAST TOMO BILATERAL  . US BREAST LTD UNI RIGHT INC AXILLA

## 2016-12-12 NOTE — Assessment & Plan Note (Signed)
No carotid bruits; may have been dehydrated, stressed; offered carotid US, patient politely declined; start baby aspirin (notify me if worsens the GERD)

## 2016-12-12 NOTE — Assessment & Plan Note (Signed)
Will get diagnostic mammo and [place] through [facility]

## 2016-12-12 NOTE — Assessment & Plan Note (Addendum)
Patient will use PPI sparingly; see AVS

## 2016-12-12 NOTE — Patient Instructions (Addendum)
We'll get imaging of the breasts and contact you with results If you have not heard anything from my staff in a week about any orders/referrals/studies from today, please contact us here to follow-up [phone] Start a baby 81 mg coated aspirin daily to help with stroke prevention Stay well-hydrated Caution: prolonged use of proton pump inhibitors like omeprazole (Prilosec), pantoprazole (Protonix), esomeprazole (Nexium), and others like Dexilant and Aciphex may increase your risk of pneumonia, Clostridium difficile colitis, osteoporosis, anemia and other health complications Try to limit or avoid triggers like coffee, caffeinated beverages, onions, chocolate, spicy foods, peppermint, acid foods like pizza, spaghetti sauce, and orange juice Lose weight if you are overweight or obese Try elevating the head of your bed by placing a small wedge between your mattress and box springs to keep acid in the stomach at night instead of coming up into your esophagus

## 2016-12-19 ENCOUNTER — Other Ambulatory Visit: Payer: 59

## 2016-12-19 ENCOUNTER — Ambulatory Visit: Payer: 59

## 2016-12-21 ENCOUNTER — Ambulatory Visit
Admission: RE | Admit: 2016-12-21 | Discharge: 2016-12-21 | Disposition: A | Discharge status: Home / Self Care | Payer: 59 | Source: Ambulatory Visit | Attending: Family Medicine | Admitting: Family Medicine

## 2016-12-21 DIAGNOSIS — N6489 Other specified disorders of breast: Secondary | ICD-10-CM | POA: Diagnosis not present

## 2016-12-21 DIAGNOSIS — N6452 Nipple discharge: Secondary | ICD-10-CM

## 2016-12-21 DIAGNOSIS — R928 Other abnormal and inconclusive findings on diagnostic imaging of breast: Secondary | ICD-10-CM | POA: Diagnosis not present

## 2016-12-22 ENCOUNTER — Other Ambulatory Visit: Payer: Self-pay | Admitting: Family Medicine

## 2016-12-24 ENCOUNTER — Telehealth: Payer: Self-pay | Admitting: Family Medicine

## 2016-12-24 NOTE — Telephone Encounter (Signed)
Patient spoke with staff member; asked why she needed to see a surgeon; sorry to have missed her call; left detailed message on identified voicemail that I just want a thorough breast exam in about 2 months; I am happy to do that for her if she doesn't want to see a surgeon; please call back and schedule visit for 2 months after mammogram, but call sooner if any changes or discharge recurs; please also call if other questions or more she wanted to disuss

## 2017-02-08 DIAGNOSIS — D225 Melanocytic nevi of trunk: Secondary | ICD-10-CM | POA: Diagnosis not present

## 2017-02-08 DIAGNOSIS — L918 Other hypertrophic disorders of the skin: Secondary | ICD-10-CM | POA: Diagnosis not present

## 2017-04-04 ENCOUNTER — Emergency Department
Admission: EM | Admit: 2017-04-04 | Discharge: 2017-04-04 | Disposition: A | Discharge status: Home / Self Care | Payer: 59 | Attending: Emergency Medicine | Admitting: Emergency Medicine

## 2017-04-04 ENCOUNTER — Emergency Department: Payer: 59

## 2017-04-04 ENCOUNTER — Encounter: Payer: Self-pay | Admitting: Family Medicine

## 2017-04-04 ENCOUNTER — Other Ambulatory Visit: Payer: Self-pay

## 2017-04-04 ENCOUNTER — Ambulatory Visit: Payer: 59 | Admitting: Family Medicine

## 2017-04-04 DIAGNOSIS — Z79899 Other long term (current) drug therapy: Secondary | ICD-10-CM | POA: Insufficient documentation

## 2017-04-04 DIAGNOSIS — J45909 Unspecified asthma, uncomplicated: Secondary | ICD-10-CM | POA: Diagnosis not present

## 2017-04-04 DIAGNOSIS — K229 Disease of esophagus, unspecified: Secondary | ICD-10-CM | POA: Diagnosis not present

## 2017-04-04 DIAGNOSIS — R1310 Dysphagia, unspecified: Secondary | ICD-10-CM

## 2017-04-04 DIAGNOSIS — R079 Chest pain, unspecified: Secondary | ICD-10-CM | POA: Diagnosis not present

## 2017-04-04 LAB — BASIC METABOLIC PANEL
Anion gap: 13 (ref 5–15)
BUN: 14 mg/dL (ref 6–20)
CHLORIDE: 101 mmol/L (ref 101–111)
CO2: 25 mmol/L (ref 22–32)
Calcium: 9.3 mg/dL (ref 8.9–10.3)
Creatinine, Ser: 0.71 mg/dL (ref 0.44–1.00)
GFR calc non Af Amer: >60 mL/min (ref >60)
Glucose, Bld: 103 mg/dL — ABNORMAL HIGH (ref 65–99)
POTASSIUM: 3.9 mmol/L (ref 3.5–5.1)
SODIUM: 139 mmol/L (ref 135–145)

## 2017-04-04 LAB — CBC
HCT: 37.9 % (ref 35.0–47.0)
Hemoglobin: 12.4 g/dL (ref 12.0–16.0)
MCH: 26 pg (ref 26.0–34.0)
MCHC: 32.7 g/dL (ref 32.0–36.0)
MCV: 79.6 fL — AB (ref 80.0–100.0)
Platelets: 381 10*3/uL (ref 150–440)
RBC: 4.76 MIL/uL (ref 3.80–5.20)
RDW: 15.4 % — ABNORMAL HIGH (ref 11.5–14.5)
WBC: 10.5 10*3/uL (ref 3.6–11.0)

## 2017-04-04 LAB — TROPONIN I: Troponin I: <0.03 ng/mL (ref <0.03)

## 2017-04-04 MED ORDER — SUCRALFATE 1 G PO TABS: 1.0000 g | ORAL_TABLET | Freq: Four times a day (QID) | ORAL | 0 refills | Status: DC

## 2017-04-04 MED ORDER — GI COCKTAIL ~~LOC~~
30.0000 mL | Freq: Once | ORAL | Status: AC
  Administered 2017-04-04: 30 mL via ORAL
  Filled 2017-04-04: qty 30

## 2017-04-04 MED ORDER — GI COCKTAIL ~~LOC~~
ORAL | Status: AC
  Filled 2017-04-04: qty 30

## 2017-04-04 NOTE — ED Provider Notes (Signed)
[facility] Emergency Department Provider Note   ____________________________________________    I have reviewed the triage vital signs and the nursing notes.   HISTORY  Chief Complaint Chest Pain     HPI Vanessa Harvey is a 56 y.o. female who presents with complaints of chest discomfort.  Patient reports yesterday she developed significant pressure when eating dinner, when she swallowed food she felt a tightness in her esophagus approximately two thirds of the way down.  This became worse today after initially getting better.  She does have a history of acid reflux.  Had an endoscopy 2 years ago which was unremarkable.  Does have a history of esophageal cancer in her family.  No fevers or chills.  No shortness of breath.  No pleurisy.  No diaphoresis   Past Medical History:  Diagnosis Date  . Allergic rhinitis, cause unspecified   . Anxiety state, unspecified   . Degenerative disc disease, lumbar   . Depressive disorder, not elsewhere classified   . Lumbago   . Other atopic dermatitis and related conditions   . Other B-complex deficiencies   . Panic disorder without agoraphobia   . Unspecified asthma(493.90)     Patient Active Problem List   Diagnosis Date Noted  . Breast discharge 12/12/2016  . Family history of stroke 12/12/2016  . Poor concentration 11/29/2015  . Sinusitis, acute frontal 11/29/2015  . Internal hemorrhoid 11/29/2015  . Right facial numbness 11/29/2015  . Preventative health care 07/21/2015  . Visit for screening mammogram 07/21/2015  . Obesity 07/06/2015  . Numbness in right leg 07/06/2015  . Submandibular lymphadenopathy 06/29/2013  . Other malaise and fatigue 06/29/2013  . Routine general medical examination at a health care facility 02/21/2012  . GERD (gastroesophageal reflux disease) 09/08/2010  . BACK PAIN, LUMBAR 03/29/2010  . VITAMIN B12 DEFICIENCY 01/24/2010  . ANXIETY 12/22/2008  . PANIC DISORDER  01/08/2007  . DEPRESSION 01/08/2007  . ALLERGIC RHINITIS 01/08/2007  . Asthma, mild intermittent 01/08/2007  . DERMATITIS, OTHER ATOPIC 12/19/2006    Past Surgical History:  Procedure Laterality Date  . Abd [place]  09/2005   Negative  . CESAREAN SECTION    . DEXA  06/2004  . GANGLION CYST EXCISION  12/2005   knee  . KNEE ARTHROSCOPY  1984   evulsion of femur s/p fall    Prior to Admission medications   Medication Sig Start Date End Date Taking? Authorizing Provider  Cyanocobalamin (VITAMIN B-12) 1000 MCG SUBL Place 1 tablet (1,000 mcg total) under the tongue daily. 08/15/12  Yes Tower, Wynelle Fanny, MD  FLUoxetine (PROZAC) 20 MG capsule Take 1 capsule (20 mg total) by mouth daily. 12/24/16  Yes Lada, Satira Anis, MD  montelukast (SINGULAIR) 10 MG tablet take 1 tablet by mouth once daily 12/24/16  Yes Lada, Satira Anis, MD  Multiple Vitamin (MULTIVITAMIN) tablet Take 1 tablet by mouth daily.     Yes [provider]  albuterol (PROVENTIL HFA) 108 (90 Base) MCG/ACT inhaler Inhale 2 puffs into the lungs every 4 (four) hours as needed. 10/10/15   Arnetha Courser, MD  ALPRAZolam Duanne Moron) 0.5 MG tablet Take 0.5 mg by mouth 2 (two) times daily as needed for anxiety.    [provider]  esomeprazole (NEXIUM) 20 MG capsule Take 1 capsule (20 mg total) by mouth daily as needed. 12/12/16   Arnetha Courser, MD  fluticasone (FLONASE) 50 MCG/ACT nasal spray Place 2 sprays into the nose daily. 08/15/12  Tower, Wynelle Fanny, MD  hydrocortisone (ANUSOL-HC) 25 MG suppository Place 1 suppository (25 mg total) rectally 2 (two) times daily. Patient not taking: Reported on 04/04/2017 11/29/15   Arnetha Courser, MD  sucralfate (CARAFATE) 1 g tablet Take 1 tablet (1 g total) by mouth 4 (four) times daily for 10 days. 04/04/17 04/14/17  Lavonia Drafts, MD     Allergies Penicillins  Family History  Problem Relation Age of Onset  . Hypertension Father        Mild  . Ulcerative colitis Father   . Cancer Father         esophageal and colon  . Stroke Father   . COPD Father   . Diabetes Maternal Grandmother   . Heart disease Neg Hx   . Breast cancer Neg Hx     Social History Social History   Tobacco Use  . Smoking status: Never Smoker  . Smokeless tobacco: Never Used  Substance Use Topics  . Alcohol use: Yes    Comment: Rare  . Drug use: No    Review of Systems  Constitutional: No fever/chills Eyes: No visual changes.  ENT: No sore throat. Cardiovascular: As above Respiratory: Denies shortness of breath. Gastrointestinal: No abdominal pain.  No nausea, no vomiting.   Genitourinary: Negative for dysuria. Musculoskeletal: Negative for back pain. Skin: Negative for rash. Neurological: Negative for headaches or weakness   ____________________________________________   PHYSICAL EXAM:  VITAL SIGNS: ED Triage Vitals  Enc Vitals Group     BP 04/04/17 1021 (!) 174/81     Pulse Rate 04/04/17 1020 84     Resp 04/04/17 1020 20     Temp 04/04/17 1020 98.3 F (36.8 C)     Temp Source 04/04/17 1020 Oral     SpO2 04/04/17 1020 100 %     Weight 04/04/17 1021 97.1 kg (214 lb)     Height 04/04/17 1021 1.651 m (5\' 5" )     Head Circumference --      Peak Flow --      Pain Score 04/04/17 1024 7     Pain Loc --      Pain Edu? --      Excl. in [place]? --     Constitutional: Alert and oriented. No acute distress. Pleasant and interactive Eyes: Conjunctivae are normal.   Nose: No congestion/rhinnorhea. Mouth/Throat: Mucous membranes are moist.    Cardiovascular: Normal rate, regular rhythm. Grossly normal heart sounds.  Good peripheral circulation. Respiratory: Normal respiratory effort.  No retractions. Lungs CTAB. Gastrointestinal: Soft and nontender. No distention.   Genitourinary: deferred Musculoskeletal: No lower extremity tenderness nor edema.  Warm and well perfused Neurologic:  Normal speech and language. No gross focal neurologic deficits are appreciated.  Skin:  Skin is warm, dry  and intact. No rash noted. Psychiatric: Mood and affect are normal. Speech and behavior are normal.  ____________________________________________   LABS (all labs ordered are listed, but only abnormal results are displayed)  Labs Reviewed  BASIC METABOLIC PANEL - Abnormal; Notable for the following components:      Result Value   Glucose, Bld 103 (*)    All other components within normal limits  CBC - Abnormal; Notable for the following components:   MCV 79.6 (*)    RDW 15.4 (*)    All other components within normal limits  TROPONIN I   ____________________________________________  EKG  ED ECG REPORT I, Lavonia Drafts, the attending physician, personally viewed and interpreted this ECG.  Date:  04/04/2017  Rhythm: normal sinus rhythm QRS Axis: normal Intervals: normal ST/T Wave abnormalities: normal Narrative Interpretation: no evidence of acute ischemia  ____________________________________________  RADIOLOGY  Chest x-ray unremarkable ____________________________________________   PROCEDURES  Procedure(s) performed: No  Procedures   Critical Care performed: No ____________________________________________   INITIAL IMPRESSION / ASSESSMENT AND PLAN / ED COURSE  Pertinent labs & imaging results that were available during my care of the patient were reviewed by me and considered in my medical decision making (see chart for details).  Patient well-appearing in no acute distress.  We discussed in detail how her symptoms are caused from swallowing food or liquid and that the pain happens when bolus passes the same spot every time.  Differential diagnosis includes ulcer disease, esophageal stricture, gastritis.  EKG troponin normal.  Not consistent with ACS.  Medical record noncontributory.  Discussed with Dr. Allen Norris of gastroenterology, he will follow with patient as an outpatient for endoscopy    ____________________________________________   FINAL CLINICAL  IMPRESSION(S) / ED DIAGNOSES  Final diagnoses:  Esophageal abnormality        Note:  This document was prepared using Dragon voice recognition software and may include unintentional dictation errors.    Lavonia Drafts, MD 04/04/17 [phone]

## 2017-04-04 NOTE — ED Triage Notes (Signed)
Pt c/o substernal/epigastic pain worse after eating since yesterday, states she took an extra nexium and tums last night trying to relieve the pain. States it is pressure/tightness.

## 2017-04-04 NOTE — ED Notes (Signed)
Pt states she has been told she had a hiatal hernia in last 10 years. In last three years, she was told she no longer has one. Pt currently complaining of a discomfort and tightness in her lower central chest/upper mid abdomen that feels like "there is a golf ball stuck." Pt states she thinks it might be her esophagus. Hx of GERD. Pt taking Nexium.

## 2017-04-04 NOTE — ED Notes (Signed)
ED Provider at bedside. 

## 2017-04-04 NOTE — ED Notes (Signed)
Friend present at bedside.

## 2017-04-04 NOTE — Progress Notes (Signed)
Patient was advised by the call center to come to the outpatient clinic for chest pain I met her at the window after two failed attempts to reach her by phone to direct her to the ER She said she is having midsternal chest pain; she says it is her esophagus; the pain goes through to her back She denies shortness of breath; she says it's not her heart I explained that she will best evaluated in an urgent setting with capability for stat labs, CXR, and they can administer a GI cocktail to see if that provides relief; they can do further testing if needed (chest CT, e.g.) My brief evaluation: WNWD female, no diaphoresis, normal color; ambulatory, no dyspnea, lungs CTA, RRR, does not appear anxious She declined [place] calling ambulance to transport her to the ER; she will drive herself I explained I recommend ER evaluation because I care; she verbalized understanding and will go now

## 2017-04-04 NOTE — ED Notes (Signed)
Pt ambulatory upon discharge. Pt verbalized understanding of discharge instructions, follow-up care, prescription and calling MD office for follow-up endoscopy. VSS. Skin warm and dry. A&O x4.

## 2017-04-05 ENCOUNTER — Other Ambulatory Visit: Payer: Self-pay

## 2017-04-05 DIAGNOSIS — R1319 Other dysphagia: Secondary | ICD-10-CM

## 2017-04-05 DIAGNOSIS — R1310 Dysphagia, unspecified: Secondary | ICD-10-CM

## 2017-04-05 NOTE — Discharge Instructions (Signed)
General Anesthesia, Adult, Care After °These instructions provide you with information about caring for yourself after your procedure. Your health care provider may also give you more specific instructions. Your treatment has been planned according to current medical practices, but problems sometimes occur. Call your health care provider if you have any problems or questions after your procedure. °What can I expect after the procedure? °After the procedure, it is common to have: °· Vomiting. °· A sore throat. °· Mental slowness. ° °It is common to feel: °· Nauseous. °· Cold or shivery. °· Sleepy. °· Tired. °· Sore or achy, even in parts of your body where you did not have surgery. ° °Follow these instructions at home: °For at least 24 hours after the procedure: °· Do not: °? Participate in activities where you could fall or become injured. °? Drive. °? Use heavy machinery. °? Drink alcohol. °? Take sleeping pills or medicines that cause drowsiness. °? Make important decisions or sign legal documents. °? Take care of children on your own. °· Rest. °Eating and drinking °· If you vomit, drink water, juice, or soup when you can drink without vomiting. °· Drink enough fluid to keep your urine clear or pale yellow. °· Make sure you have little or no nausea before eating solid foods. °· Follow the diet recommended by your health care provider. °General instructions °· Have a responsible adult stay with you until you are awake and alert. °· Return to your normal activities as told by your health care provider. Ask your health care provider what activities are safe for you. °· Take over-the-counter and prescription medicines only as told by your health care provider. °· If you smoke, do not smoke without supervision. °· Keep all follow-up visits as told by your health care provider. This is important. °Contact a health care provider if: °· You continue to have nausea or vomiting at home, and medicines are not helpful. °· You  cannot drink fluids or start eating again. °· You cannot urinate after 8-12 hours. °· You develop a skin rash. °· You have fever. °· You have increasing redness at the site of your procedure. °Get help right away if: °· You have difficulty breathing. °· You have chest pain. °· You have unexpected bleeding. °· You feel that you are having a life-threatening or urgent problem. °This information is not intended to replace advice given to you by your health care provider. Make sure you discuss any questions you have with your health care provider. °Document Released: 07/30/2000 Document Revised: 09/26/2015 Document Reviewed: 04/07/2015 °Elsevier Interactive Patient Education © 2018 Elsevier Inc. ° °

## 2017-04-08 ENCOUNTER — Ambulatory Visit: Payer: Commercial Managed Care - HMO | Admitting: Anesthesiology

## 2017-04-08 ENCOUNTER — Encounter
Admission: RE | Disposition: A | Discharge status: Home / Self Care | Payer: Self-pay | Source: Ambulatory Visit | Attending: Gastroenterology

## 2017-04-08 ENCOUNTER — Ambulatory Visit
Admission: RE | Admit: 2017-04-08 | Discharge: 2017-04-08 | Disposition: A | Discharge status: Home / Self Care | Payer: Commercial Managed Care - HMO | Source: Ambulatory Visit | Attending: Gastroenterology | Admitting: Gastroenterology

## 2017-04-08 DIAGNOSIS — F329 Major depressive disorder, single episode, unspecified: Secondary | ICD-10-CM | POA: Diagnosis not present

## 2017-04-08 DIAGNOSIS — K449 Diaphragmatic hernia without obstruction or gangrene: Secondary | ICD-10-CM | POA: Insufficient documentation

## 2017-04-08 DIAGNOSIS — Z79899 Other long term (current) drug therapy: Secondary | ICD-10-CM | POA: Diagnosis not present

## 2017-04-08 DIAGNOSIS — K317 Polyp of stomach and duodenum: Secondary | ICD-10-CM | POA: Insufficient documentation

## 2017-04-08 DIAGNOSIS — R1310 Dysphagia, unspecified: Secondary | ICD-10-CM | POA: Diagnosis not present

## 2017-04-08 DIAGNOSIS — J45909 Unspecified asthma, uncomplicated: Secondary | ICD-10-CM | POA: Diagnosis not present

## 2017-04-08 DIAGNOSIS — F419 Anxiety disorder, unspecified: Secondary | ICD-10-CM | POA: Insufficient documentation

## 2017-04-08 DIAGNOSIS — F410 Panic disorder [episodic paroxysmal anxiety] without agoraphobia: Secondary | ICD-10-CM | POA: Insufficient documentation

## 2017-04-08 DIAGNOSIS — K219 Gastro-esophageal reflux disease without esophagitis: Secondary | ICD-10-CM | POA: Diagnosis not present

## 2017-04-08 HISTORY — PX: ESOPHAGOGASTRODUODENOSCOPY (EGD) WITH PROPOFOL: SHX5813

## 2017-04-08 HISTORY — DX: Unspecified asthma, uncomplicated: J45.909

## 2017-04-08 SURGERY — ESOPHAGOGASTRODUODENOSCOPY (EGD) WITH PROPOFOL
Anesthesia: General | Wound class: Clean Contaminated

## 2017-04-08 MED ORDER — LACTATED RINGERS IV SOLN
INTRAVENOUS | Status: DC | PRN
  Administered 2017-04-08: 08:00:00 via INTRAVENOUS

## 2017-04-08 MED ORDER — GLYCOPYRROLATE 0.2 MG/ML IJ SOLN
INTRAMUSCULAR | Status: DC | PRN
  Administered 2017-04-08: 0.2 mg via INTRAVENOUS

## 2017-04-08 MED ORDER — PROPOFOL 10 MG/ML IV BOLUS
INTRAVENOUS | Status: DC | PRN
  Administered 2017-04-08: 50 mg via INTRAVENOUS
  Administered 2017-04-08: 100 mg via INTRAVENOUS

## 2017-04-08 MED ORDER — LIDOCAINE HCL (CARDIAC) 20 MG/ML IV SOLN
INTRAVENOUS | Status: DC | PRN
  Administered 2017-04-08: 50 mg via INTRAVENOUS

## 2017-04-08 SURGICAL SUPPLY — 32 items
BALLN DILATOR 10-12 8 (BALLOONS)
BALLN DILATOR 12-15 8 (BALLOONS)
BALLN DILATOR 15-18 8 (BALLOONS)
BALLN DILATOR CRE 0-12 8 (BALLOONS)
BALLN DILATOR ESOPH 8 10 CRE (MISCELLANEOUS) IMPLANT
BALLOON DILATOR 12-15 8 (BALLOONS) IMPLANT
BALLOON DILATOR 15-18 8 (BALLOONS) IMPLANT
BALLOON DILATOR CRE 0-12 8 (BALLOONS) IMPLANT
BLOCK BITE 60FR ADLT L/F GRN (MISCELLANEOUS) ×2 IMPLANT
CANISTER SUCT 1200ML W/VALVE (MISCELLANEOUS) ×2 IMPLANT
CLIP HMST 235XBRD CATH ROT (MISCELLANEOUS) IMPLANT
CLIP RESOLUTION 360 11X235 (MISCELLANEOUS)
FCP ESCP3.2XJMB 240X2.8X (MISCELLANEOUS)
FORCEPS BIOP RAD 4 LRG CAP 4 (CUTTING FORCEPS) IMPLANT
FORCEPS BIOP RJ4 240 W/NDL (MISCELLANEOUS)
FORCEPS ESCP3.2XJMB 240X2.8X (MISCELLANEOUS) IMPLANT
GOWN CVR UNV OPN BCK APRN NK (MISCELLANEOUS) ×2 IMPLANT
GOWN ISOL THUMB LOOP REG UNIV (MISCELLANEOUS) ×4
INJECTOR VARIJECT VIN23 (MISCELLANEOUS) IMPLANT
KIT DEFENDO VALVE AND CONN (KITS) IMPLANT
KIT ENDO PROCEDURE OLY (KITS) ×2 IMPLANT
MARKER SPOT ENDO TATTOO 5ML (MISCELLANEOUS) IMPLANT
PAD GROUND ADULT SPLIT (MISCELLANEOUS) IMPLANT
RETRIEVER NET PLAT FOOD (MISCELLANEOUS) IMPLANT
SNARE SHORT THROW 13M SML OVAL (MISCELLANEOUS) IMPLANT
SNARE SHORT THROW 30M LRG OVAL (MISCELLANEOUS) IMPLANT
SPOT EX ENDOSCOPIC TATTOO (MISCELLANEOUS)
SYR INFLATION 60ML (SYRINGE) IMPLANT
TRAP ETRAP POLY (MISCELLANEOUS) IMPLANT
VARIJECT INJECTOR VIN23 (MISCELLANEOUS)
WATER STERILE IRR 250ML POUR (IV SOLUTION) ×2 IMPLANT
WIRE CRE 18-20MM 8CM F G (MISCELLANEOUS) IMPLANT

## 2017-04-08 NOTE — Op Note (Addendum)
[facility] Patient Name: Vanessa Harvey Procedure Date: 04/08/2017 8:42 AM MRN: [id number] Account #: [id number] Date of Birth: 03-27-1961 Admit Type: Outpatient Age: 56 Room: [facility] OR ROOM 01 Gender: Female Note Status: Supervisor Override Procedure:            Upper GI endoscopy Indications:          Odynophagia Providers:            [name] MD, MD Referring MD:         Arnetha Courser (Referring MD) Medicines:            Propofol per Anesthesia Complications:        No immediate complications. Procedure:            Pre-Anesthesia Assessment:                       - Prior to the procedure, a History and Physical was                        performed, and patient medications and allergies were                        reviewed. The patient's tolerance of previous                        anesthesia was also reviewed. The risks and benefits of                        the procedure and the sedation options and risks were                        discussed with the patient. All questions were                        answered, and informed consent was obtained. Prior                        Anticoagulants: The patient has taken no previous                        anticoagulant or antiplatelet agents. ASA Grade                        Assessment: II - A patient with mild systemic disease.                        After reviewing the risks and benefits, the patient was                        deemed in satisfactory condition to undergo the                        procedure.                       After obtaining informed consent, the endoscope was                        passed under direct vision. Throughout the procedure,  the patient's blood pressure, pulse, and oxygen                        saturations were monitored continuously. The Olympus                        190 Endoscope [id number]) was introduced through the   mouth, and advanced to the second part of duodenum. The                        upper GI endoscopy was accomplished without difficulty.                        The patient tolerated the procedure well. Findings:      A small hiatal hernia was present.      A single 2 mm sessile polyp with no bleeding and no stigmata of recent       bleeding was found in the gastric fundus.      The stomach was normal.      The examined duodenum was normal. Impression:           - No specimens collected. Recommendation:       - Discharge patient to home.                       - Resume previous diet.                       - Continue present medications.                       - Use a proton pump inhibitor PO daily. Procedure Code(s):    --- Professional ---                       [phone], Esophagogastroduodenoscopy, flexible, transoral;                        diagnostic, including collection of specimen(s) by                        brushing or washing, when performed (separate procedure) Diagnosis Code(s):    --- Professional ---                       R13.10, Dysphagia, unspecified CPT copyright 2018 American Medical Association. All rights reserved. The codes documented in this report are preliminary and upon coder review may  be revised to meet current compliance requirements. [name] MD, MD 04/08/2017 9:00:48 AM This report has been signed electronically. Number of Addenda: 0 Note Initiated On: 04/08/2017 8:42 AM      [facility]

## 2017-04-08 NOTE — H&P (Signed)
Vanessa Lame, MD [address] [place], [place] [address] Phone:[phone] Fax : [phone]  Primary Care Physician:  Arnetha Courser, MD Primary Gastroenterologist:  Dr. Allen Norris  Pre-Procedure History & Physical: HPI:  Vanessa Harvey is a 56 y.o. female is here for an endoscopy.   Past Medical History:  Diagnosis Date  . Allergic rhinitis, cause unspecified   . Anxiety state, unspecified   . Asthma   . Degenerative disc disease, lumbar   . Depressive disorder, not elsewhere classified   . Lumbago   . Other atopic dermatitis and related conditions   . Other B-complex deficiencies   . Panic disorder without agoraphobia   . Unspecified asthma(493.90)     Past Surgical History:  Procedure Laterality Date  . Abd [place]  09/2005   Negative  . CESAREAN SECTION    . DEXA  06/2004  . GANGLION CYST EXCISION  12/2005   knee  . KNEE ARTHROSCOPY  1984   evulsion of femur s/p fall    Prior to Admission medications   Medication Sig Start Date End Date Taking? Authorizing Provider  ALPRAZolam Duanne Moron) 0.5 MG tablet Take 0.5 mg by mouth 2 (two) times daily as needed for anxiety.   Yes [provider]  Cyanocobalamin (VITAMIN B-12) 1000 MCG SUBL Place 1 tablet (1,000 mcg total) under the tongue daily. 08/15/12  Yes Tower, Wynelle Fanny, MD  esomeprazole (NEXIUM) 20 MG capsule Take 1 capsule (20 mg total) by mouth daily as needed. 12/12/16  Yes Lada, Satira Anis, MD  FLUoxetine (PROZAC) 20 MG capsule Take 1 capsule (20 mg total) by mouth daily. 12/24/16  Yes Lada, Satira Anis, MD  fluticasone (FLONASE) 50 MCG/ACT nasal spray Place 2 sprays into the nose daily. 08/15/12  Yes Tower, Wynelle Fanny, MD  montelukast (SINGULAIR) 10 MG tablet take 1 tablet by mouth once daily 12/24/16  Yes Lada, Satira Anis, MD  Multiple Vitamin (MULTIVITAMIN) tablet Take 1 tablet by mouth daily.     Yes [provider]  sucralfate (CARAFATE) 1 g tablet Take 1 tablet (1 g total) by mouth 4 (four) times  daily for 10 days. 04/04/17 04/14/17 Yes Lavonia Drafts, MD  albuterol (PROVENTIL HFA) 108 (90 Base) MCG/ACT inhaler Inhale 2 puffs into the lungs every 4 (four) hours as needed. 10/10/15   Arnetha Courser, MD  hydrocortisone (ANUSOL-HC) 25 MG suppository Place 1 suppository (25 mg total) rectally 2 (two) times daily. Patient not taking: Reported on 04/04/2017 11/29/15   Arnetha Courser, MD    Allergies as of 04/04/2017 - Review Complete 04/04/2017  Allergen Reaction Noted  . Penicillins      Family History  Problem Relation Age of Onset  . Hypertension Father        Mild  . Ulcerative colitis Father   . Cancer Father        esophageal and colon  . Stroke Father   . COPD Father   . Diabetes Maternal Grandmother   . Heart disease Neg Hx   . Breast cancer Neg Hx     Social History   Socioeconomic History  . Marital status: Married    Spouse name: Not on file  . Number of children: 2  . Years of education: Not on file  . Highest education level: Not on file  Social Needs  . Financial resource strain: Not on file  . Food insecurity - worry: Not on file  . Food insecurity - inability: Not on file  .  Transportation needs - medical: Not on file  . Transportation needs - non-medical: Not on file  Occupational History  . Occupation: Art therapist  Tobacco Use  . Smoking status: Never Smoker  . Smokeless tobacco: Never Used  Substance and Sexual Activity  . Alcohol use: Yes    Comment: Rare  . Drug use: No  . Sexual activity: Yes  Other Topics Concern  . Not on file  Social History Narrative   Exercise-yes    Review of Systems: See HPI, otherwise negative ROS  Physical Exam: BP (!) 155/73   Pulse 83   Temp 97.6 F (36.4 C) (Temporal)   Ht 5\' 5"  (1.651 m)   Wt 211 lb (95.7 kg)   LMP 05/07/2009   SpO2 97%   BMI 35.11 kg/m  General:   Alert,  pleasant and cooperative in NAD Head:  Normocephalic and atraumatic. Neck:  Supple; no masses or thyromegaly. Lungs:   Clear throughout to auscultation.    Heart:  Regular rate and rhythm. Abdomen:  Soft, nontender and nondistended. Normal bowel sounds, without guarding, and without rebound.   Neurologic:  Alert and  oriented x4;  grossly normal neurologically.  Impression/Plan: OPEL LEJEUNE is here for an endoscopy to be performed for odynophagia  Risks, benefits, limitations, and alternatives regarding  endoscopy have been reviewed with the patient.  Questions have been answered.  All parties agreeable.   Vanessa Lame, MD  04/08/2017, 8:28 AM

## 2017-04-08 NOTE — Anesthesia Postprocedure Evaluation (Signed)
Anesthesia Post Note  Patient: Vanessa Harvey  Procedure(s) Performed: ESOPHAGOGASTRODUODENOSCOPY (EGD) WITH PROPOFOL (N/A )  Patient location during evaluation: PACU Anesthesia Type: General Level of consciousness: awake and alert, oriented and patient cooperative Pain management: pain level controlled Vital Signs Assessment: post-procedure vital signs reviewed and stable Respiratory status: spontaneous breathing, nonlabored ventilation and respiratory function stable Cardiovascular status: blood pressure returned to baseline and stable Postop Assessment: adequate PO intake Anesthetic complications: no    Darrin Nipper

## 2017-04-08 NOTE — Anesthesia Preprocedure Evaluation (Signed)
Anesthesia Evaluation  Patient identified by MRN, date of birth, ID band Patient awake    History of Anesthesia Complications Negative for: history of anesthetic complications  Airway Mallampati: IV  TM Distance: >3 FB Neck ROM: Full    Dental no notable dental hx.    Pulmonary asthma ,  Snoring    Pulmonary exam normal breath sounds clear to auscultation       Cardiovascular Exercise Tolerance: Good negative cardio ROS Normal cardiovascular exam Rhythm:Regular Rate:Normal     Neuro/Psych PSYCHIATRIC DISORDERS Anxiety Depression negative neurological ROS     GI/Hepatic Neg liver ROS, GERD  ,  Endo/Other  negative endocrine ROS  Renal/GU negative Renal ROS     Musculoskeletal   Abdominal   Peds  Hematology negative hematology ROS (+)   Anesthesia Other Findings   Reproductive/Obstetrics                             Anesthesia Physical Anesthesia Plan  ASA: II  Anesthesia Plan: General   Post-op Pain Management:    Induction: Intravenous  PONV Risk Score and Plan: 2 and Propofol infusion  Airway Management Planned: Natural Airway  Additional Equipment:   Intra-op Plan:   Post-operative Plan:   Informed Consent: I have reviewed the patients History and Physical, chart, labs and discussed the procedure including the risks, benefits and alternatives for the proposed anesthesia with the patient or authorized representative who has indicated his/her understanding and acceptance.     Plan Discussed with: CRNA  Anesthesia Plan Comments:         Anesthesia Quick Evaluation

## 2017-04-08 NOTE — Transfer of Care (Signed)
Immediate Anesthesia Transfer of Care Note  Patient: Vanessa Harvey  Procedure(s) Performed: ESOPHAGOGASTRODUODENOSCOPY (EGD) WITH PROPOFOL (N/A )  Patient Location: PACU  Anesthesia Type: General  Level of Consciousness: awake, alert  and patient cooperative  Airway and Oxygen Therapy: Patient Spontanous Breathing and Patient connected to supplemental oxygen  Post-op Assessment: Post-op Vital signs reviewed, Patient's Cardiovascular Status Stable, Respiratory Function Stable, Patent Airway and No signs of Nausea or vomiting  Post-op Vital Signs: Reviewed and stable  Complications: No apparent anesthesia complications

## 2017-04-08 NOTE — Anesthesia Procedure Notes (Signed)
Performed by: Antrice Pal, CRNA Pre-anesthesia Checklist: Patient identified, Emergency Drugs available, Suction available, Timeout performed and Patient being monitored Patient Re-evaluated:Patient Re-evaluated prior to induction Oxygen Delivery Method: Nasal cannula Placement Confirmation: positive ETCO2       

## 2017-04-09 ENCOUNTER — Encounter: Payer: Self-pay | Admitting: Gastroenterology

## 2017-08-23 ENCOUNTER — Ambulatory Visit (INDEPENDENT_AMBULATORY_CARE_PROVIDER_SITE_OTHER): Payer: 59 | Admitting: Family Medicine

## 2017-08-23 ENCOUNTER — Encounter: Payer: Self-pay | Admitting: Family Medicine

## 2017-08-23 VITALS — BP 140/74 | HR 90 | Temp 98.6°F | Resp 14 | Ht 65.0 in | Wt 217.6 lb

## 2017-08-23 DIAGNOSIS — N898 Other specified noninflammatory disorders of vagina: Secondary | ICD-10-CM

## 2017-08-23 DIAGNOSIS — Z6836 Body mass index (BMI) 36.0-36.9, adult: Secondary | ICD-10-CM

## 2017-08-23 DIAGNOSIS — Z1231 Encounter for screening mammogram for malignant neoplasm of breast: Secondary | ICD-10-CM | POA: Diagnosis not present

## 2017-08-23 DIAGNOSIS — Z0000 Encounter for general adult medical examination without abnormal findings: Secondary | ICD-10-CM | POA: Diagnosis not present

## 2017-08-23 DIAGNOSIS — E6609 Other obesity due to excess calories: Secondary | ICD-10-CM | POA: Diagnosis not present

## 2017-08-23 DIAGNOSIS — N6452 Nipple discharge: Secondary | ICD-10-CM

## 2017-08-23 DIAGNOSIS — B001 Herpesviral vesicular dermatitis: Secondary | ICD-10-CM | POA: Diagnosis not present

## 2017-08-23 DIAGNOSIS — Z1239 Encounter for other screening for malignant neoplasm of breast: Secondary | ICD-10-CM

## 2017-08-23 DIAGNOSIS — I129 Hypertensive chronic kidney disease with stage 1 through stage 4 chronic kidney disease, or unspecified chronic kidney disease: Secondary | ICD-10-CM | POA: Insufficient documentation

## 2017-08-23 DIAGNOSIS — I10 Essential (primary) hypertension: Secondary | ICD-10-CM | POA: Diagnosis not present

## 2017-08-23 DIAGNOSIS — Z124 Encounter for screening for malignant neoplasm of cervix: Secondary | ICD-10-CM

## 2017-08-23 DIAGNOSIS — N6459 Other signs and symptoms in breast: Secondary | ICD-10-CM | POA: Diagnosis not present

## 2017-08-23 NOTE — Assessment & Plan Note (Signed)
Discussed options, including DASH guidelines, weight loss and/or medicines; she opts for weight loss, DASH guidelines; close f/u in 6 weeks

## 2017-08-23 NOTE — Patient Instructions (Addendum)
Check out the information at familydoctor.org entitled "Nutrition for Weight Loss: What You Need to Know about Fad Diets" Try to lose between 1-2 pounds per week by taking in fewer calories and burning off more calories You can succeed by limiting portions, limiting foods dense in calories and fat, becoming more active, and drinking 8 glasses of water a day (64 ounces) Don't skip meals, especially breakfast, as skipping meals may alter your metabolism Do not use over-the-counter weight loss pills or gimmicks that claim rapid weight loss A healthy BMI (or body mass index) is between 18.5 and 24.9 You can calculate your ideal BMI at the [place] website [employer]fr  Consider getting the new shingles vaccine called Shingrix; that is available for individuals 52 years of age and older, and is recommended even if you have had shingles in the past and/or already received the old shingles vaccine (Zostavax); it is a two-part series, and is available at many local pharmacies  L-lysine or B complex stress tabs may help decrease frequency of outbreaks  Increase walking slowly and gradually to 150 minutes a week or 6,000 steps a day  I'll recommend a daily 81 mg coated baby aspirin  Try to follow the DASH guidelines (DASH stands for Dietary Approaches to Stop Hypertension). Try to limit the sodium in your diet to no more than 1,511m of sodium per day. Certainly try to not exceed 2,000 mg per day at the very most. Do not add salt when cooking or at the table.  Check the sodium amount on labels when shopping, and choose items lower in sodium when given a choice. Avoid or limit foods that already contain a lot of sodium. Eat a diet rich in fruits and vegetables and whole grains, and try to lose weight if overweight or obese  Health Maintenance, Female Adopting a healthy lifestyle and getting preventive care can go a long way to promote health and wellness.  Talk with your health care provider about what schedule of regular examinations is right for you. This is a good chance for you to check in with your provider about disease prevention and staying healthy. In between checkups, there are plenty of things you can do on your own. Experts have done a lot of research about which lifestyle changes and preventive measures are most likely to keep you healthy. Ask your health care provider for more information. Weight and diet Eat a healthy diet  Be sure to include plenty of vegetables, fruits, low-fat dairy products, and lean protein.  Do not eat a lot of foods high in solid fats, added sugars, or salt.  Get regular exercise. This is one of the most important things you can do for your health. ? Most adults should exercise for at least 150 minutes each week. The exercise should increase your heart rate and make you sweat (moderate-intensity exercise). ? Most adults should also do strengthening exercises at least twice a week. This is in addition to the moderate-intensity exercise.  Maintain a healthy weight  Body mass index (BMI) is a measurement that can be used to identify possible weight problems. It estimates body fat based on height and weight. Your health care provider can help determine your BMI and help you achieve or maintain a healthy weight.  For females 245years of age and older: ? A BMI below 18.5 is considered underweight. ? A BMI of 18.5 to 24.9 is normal. ? A BMI of 25 to 29.9 is considered overweight. ? A BMI of  30 and above is considered obese.  Watch levels of cholesterol and blood lipids  You should start having your blood tested for lipids and cholesterol at 59 years of age, then have this test every 5 years.  You may need to have your cholesterol levels checked more often if: ? Your lipid or cholesterol levels are high. ? You are older than 59 years of age. ? You are at high risk for heart disease.  Cancer screening Lung  Cancer  Lung cancer screening is recommended for adults 58-50 years old who are at high risk for lung cancer because of a history of smoking.  A yearly low-dose CT scan of the lungs is recommended for people who: ? Currently smoke. ? Have quit within the past 15 years. ? Have at least a 30-pack-year history of smoking. A pack year is smoking an average of one pack of cigarettes a day for 1 year.  Yearly screening should continue until it has been 15 years since you quit.  Yearly screening should stop if you develop a health problem that would prevent you from having lung cancer treatment.  Breast Cancer  Practice breast self-awareness. This means understanding how your breasts normally appear and feel.  It also means doing regular breast self-exams. Let your health care provider know about any changes, no matter how small.  If you are in your 20s or 30s, you should have a clinical breast exam (CBE) by a health care provider every 1-3 years as part of a regular health exam.  If you are 68 or older, have a CBE every year. Also consider having a breast X-ray (mammogram) every year.  If you have a family history of breast cancer, talk to your health care provider about genetic screening.  If you are at high risk for breast cancer, talk to your health care provider about having an MRI and a mammogram every year.  Breast cancer gene (BRCA) assessment is recommended for women who have family members with BRCA-related cancers. BRCA-related cancers include: ? Breast. ? Ovarian. ? Tubal. ? Peritoneal cancers.  Results of the assessment will determine the need for genetic counseling and BRCA1 and BRCA2 testing.  Cervical Cancer Your health care provider may recommend that you be screened regularly for cancer of the pelvic organs (ovaries, uterus, and vagina). This screening involves a pelvic examination, including checking for microscopic changes to the surface of your cervix (Pap test). You  may be encouraged to have this screening done every 3 years, beginning at age 77.  For women ages 46-65, health care providers may recommend pelvic exams and Pap testing every 3 years, or they may recommend the Pap and pelvic exam, combined with testing for human papilloma virus (HPV), every 5 years. Some types of HPV increase your risk of cervical cancer. Testing for HPV may also be done on women of any age with unclear Pap test results.  Other health care providers may not recommend any screening for nonpregnant women who are considered low risk for pelvic cancer and who do not have symptoms. Ask your health care provider if a screening pelvic exam is right for you.  If you have had past treatment for cervical cancer or a condition that could lead to cancer, you need Pap tests and screening for cancer for at least 20 years after your treatment. If Pap tests have been discontinued, your risk factors (such as having a new sexual partner) need to be reassessed to determine if screening should resume.  Some women have medical problems that increase the chance of getting cervical cancer. In these cases, your health care provider may recommend more frequent screening and Pap tests.  Colorectal Cancer  This type of cancer can be detected and often prevented.  Routine colorectal cancer screening usually begins at 59 years of age and continues through 59 years of age.  Your health care provider may recommend screening at an earlier age if you have risk factors for colon cancer.  Your health care provider may also recommend using home test kits to check for hidden blood in the stool.  A small camera at the end of a tube can be used to examine your colon directly (sigmoidoscopy or colonoscopy). This is done to check for the earliest forms of colorectal cancer.  Routine screening usually begins at age 36.  Direct examination of the colon should be repeated every 5-10 years through 59 years of age.  However, you may need to be screened more often if early forms of precancerous polyps or small growths are found.  Skin Cancer  Check your skin from head to toe regularly.  Tell your health care provider about any new moles or changes in moles, especially if there is a change in a mole's shape or color.  Also tell your health care provider if you have a mole that is larger than the size of a pencil eraser.  Always use sunscreen. Apply sunscreen liberally and repeatedly throughout the day.  Protect yourself by wearing long sleeves, pants, a wide-brimmed hat, and sunglasses whenever you are outside.  Heart disease, diabetes, and high blood pressure  High blood pressure causes heart disease and increases the risk of stroke. High blood pressure is more likely to develop in: ? People who have blood pressure in the high end of the normal range (130-139/85-89 mm Hg). ? People who are overweight or obese. ? People who are African American.  If you are 21-46 years of age, have your blood pressure checked every 3-5 years. If you are 38 years of age or older, have your blood pressure checked every year. You should have your blood pressure measured twice-once when you are at a hospital or clinic, and once when you are not at a hospital or clinic. Record the average of the two measurements. To check your blood pressure when you are not at a hospital or clinic, you can use: ? An automated blood pressure machine at a pharmacy. ? A home blood pressure monitor.  If you are between 83 years and 57 years old, ask your health care provider if you should take aspirin to prevent strokes.  Have regular diabetes screenings. This involves taking a blood sample to check your fasting blood sugar level. ? If you are at a normal weight and have a low risk for diabetes, have this test once every three years after 59 years of age. ? If you are overweight and have a high risk for diabetes, consider being tested at a  younger age or more often. Preventing infection Hepatitis B  If you have a higher risk for hepatitis B, you should be screened for this virus. You are considered at high risk for hepatitis B if: ? You were born in a country where hepatitis B is common. Ask your health care provider which countries are considered high risk. ? Your parents were born in a high-risk country, and you have not been immunized against hepatitis B (hepatitis B vaccine). ? You have HIV or AIDS. ?  You use needles to inject street drugs. ? You live with someone who has hepatitis B. ? You have had sex with someone who has hepatitis B. ? You get hemodialysis treatment. ? You take certain medicines for conditions, including cancer, organ transplantation, and autoimmune conditions.  Hepatitis C  Blood testing is recommended for: ? Everyone born from [birth year] through 1967. ? Anyone with known risk factors for hepatitis C.  Sexually transmitted infections (STIs)  You should be screened for sexually transmitted infections (STIs) including gonorrhea and chlamydia if: ? You are sexually active and are younger than 59 years of age. ? You are older than 58 years of age and your health care provider tells you that you are at risk for this type of infection. ? Your sexual activity has changed since you were last screened and you are at an increased risk for chlamydia or gonorrhea. Ask your health care provider if you are at risk.  If you do not have HIV, but are at risk, it may be recommended that you take a prescription medicine daily to prevent HIV infection. This is called pre-exposure prophylaxis (PrEP). You are considered at risk if: ? You are sexually active and do not regularly use condoms or know the HIV status of your partner(s). ? You take drugs by injection. ? You are sexually active with a partner who has HIV.  Talk with your health care provider about whether you are at high risk of being infected with HIV. If you  choose to begin PrEP, you should first be tested for HIV. You should then be tested every 3 months for as long as you are taking PrEP. Pregnancy  If you are premenopausal and you may become pregnant, ask your health care provider about preconception counseling.  If you may become pregnant, take 400 to 800 micrograms (mcg) of folic acid every day.  If you want to prevent pregnancy, talk to your health care provider about birth control (contraception). Osteoporosis and menopause  Osteoporosis is a disease in which the bones lose minerals and strength with aging. This can result in serious bone fractures. Your risk for osteoporosis can be identified using a bone density scan.  If you are 35 years of age or older, or if you are at risk for osteoporosis and fractures, ask your health care provider if you should be screened.  Ask your health care provider whether you should take a calcium or vitamin D supplement to lower your risk for osteoporosis.  Menopause may have certain physical symptoms and risks.  Hormone replacement therapy may reduce some of these symptoms and risks. Talk to your health care provider about whether hormone replacement therapy is right for you. Follow these instructions at home:  Schedule regular health, dental, and eye exams.  Stay current with your immunizations.  Do not use any tobacco products including cigarettes, chewing tobacco, or electronic cigarettes.  If you are pregnant, do not drink alcohol.  If you are breastfeeding, limit how much and how often you drink alcohol.  Limit alcohol intake to no more than 1 drink per day for nonpregnant women. One drink equals 12 ounces of beer, 5 ounces of wine, or 1 ounces of hard liquor.  Do not use street drugs.  Do not share needles.  Ask your health care provider for help if you need support or information about quitting drugs.  Tell your health care provider if you often feel depressed.  Tell your health  care provider if you have  ever been abused or do not feel safe at home. This information is not intended to replace advice given to you by your health care provider. Make sure you discuss any questions you have with your health care provider. Document Released: 11/06/2010 Document Revised: 09/29/2015 Document Reviewed: 01/25/2015 Elsevier Interactive Patient Education  2018 [employer].  Obesity, Adult Obesity is the condition of having too much total body fat. Being overweight or obese means that your weight is greater than what is considered healthy for your body size. Obesity is determined by a measurement called BMI. BMI is an estimate of body fat and is calculated from height and weight. For adults, a BMI of 30 or higher is considered obese. Obesity can eventually lead to other health concerns and major illnesses, including:  Stroke.  Coronary artery disease (CAD).  Type 2 diabetes.  Some types of cancer, including cancers of the colon, breast, uterus, and gallbladder.  Osteoarthritis.  High blood pressure (hypertension).  High cholesterol.  Sleep apnea.  Gallbladder stones.  Infertility problems.  What are the causes? The main cause of obesity is taking in (consuming) more calories than your body uses for energy. Other factors that contribute to this condition may include:  Being born with genes that make you more likely to become obese.  Having a medical condition that causes obesity. These conditions include: ? Hypothyroidism. ? Polycystic ovarian syndrome (PCOS). ? Binge-eating disorder. ? Cushing syndrome.  Taking certain medicines, such as steroids, antidepressants, and seizure medicines.  Not being physically active (sedentary lifestyle).  Living where there are limited places to exercise safely or buy healthy foods.  Not getting enough sleep.  What increases the risk? The following factors may increase your risk of this condition:  Having a family  history of obesity.  Being a woman of African-American descent.  Being a man of Hispanic descent.  What are the signs or symptoms? Having excessive body fat is the main symptom of this condition. How is this diagnosed? This condition may be diagnosed based on:  Your symptoms.  Your medical history.  A physical exam. Your health care provider may measure: ? Your BMI. If you are an adult with a BMI between 25 and less than 30, you are considered overweight. If you are an adult with a BMI of 30 or higher, you are considered obese. ? The distances around your hips and your waist (circumferences). These may be compared to each other to help diagnose your condition. ? Your skinfold thickness. Your health care provider may gently pinch a fold of your skin and measure it.  How is this treated? Treatment for this condition often includes changing your lifestyle. Treatment may include some or all of the following:  Dietary changes. Work with your health care provider and a dietitian to set a weight-loss goal that is healthy and reasonable for you. Dietary changes may include eating: ? Smaller portions. A portion size is the amount of a particular food that is healthy for you to eat at one time. This varies from person to person. ? Low-calorie or low-fat options. ? More whole grains, fruits, and vegetables.  Regular physical activity. This may include aerobic activity (cardio) and strength training.  Medicine to help you lose weight. Your health care provider may prescribe medicine if you are unable to lose 1 pound a week after 6 weeks of eating more healthily and doing more physical activity.  Surgery. Surgical options may include gastric banding and gastric bypass. Surgery may be  done if: ? Other treatments have not helped to improve your condition. ? You have a BMI of 40 or higher. ? You have life-threatening health problems related to obesity.  Follow these instructions at  home:  Eating and drinking   Follow recommendations from your health care provider about what you eat and drink. Your health care provider may advise you to: ? Limit fast foods, sweets, and processed snack foods. ? Choose low-fat options, such as low-fat milk instead of whole milk. ? Eat 5 or more servings of fruits or vegetables every day. ? Eat at home more often. This gives you more control over what you eat. ? Choose healthy foods when you eat out. ? Learn what a healthy portion size is. ? Keep low-fat snacks on hand. ? Avoid sugary drinks, such as soda, fruit juice, iced tea sweetened with sugar, and flavored milk. ? Eat a healthy breakfast.  Drink enough water to keep your urine clear or pale yellow.  Do not go without eating for long periods of time (do not fast) or follow a fad diet. Fasting and fad diets can be unhealthy and even dangerous. Physical Activity  Exercise regularly, as told by your health care provider. Ask your health care provider what types of exercise are safe for you and how often you should exercise.  Warm up and stretch before being active.  Cool down and stretch after being active.  Rest between periods of activity. Lifestyle  Limit the time that you spend in front of your TV, computer, or video game system.  Find ways to reward yourself that do not involve food.  Limit alcohol intake to no more than 1 drink a day for nonpregnant women and 2 drinks a day for men. One drink equals 12 oz of beer, 5 oz of wine, or 1 oz of hard liquor. General instructions  Keep a weight loss journal to keep track of the food you eat and how much you exercise you get.  Take over-the-counter and prescription medicines only as told by your health care provider.  Take vitamins and supplements only as told by your health care provider.  Consider joining a support group. Your health care provider may be able to recommend a support group.  Keep all follow-up visits as  told by your health care provider. This is important. Contact a health care provider if:  You are unable to meet your weight loss goal after 6 weeks of dietary and lifestyle changes. This information is not intended to replace advice given to you by your health care provider. Make sure you discuss any questions you have with your health care provider. Document Released: 05/31/2004 Document Revised: 09/26/2015 Document Reviewed: 02/09/2015 Elsevier Interactive Patient Education  2018 [place].  Preventing Unhealthy [employer], Adult Staying at a healthy weight is important. When fat builds up in your body, you may become overweight or obese. These conditions put you at greater risk for developing certain health problems, such as heart disease, diabetes, sleeping problems, joint problems, and some cancers. Unhealthy weight gain is often the result of making unhealthy choices in what you eat. It is also a result of not getting enough exercise. You can make changes to your lifestyle to prevent obesity and stay as healthy as possible. What nutrition changes can be made? To maintain a healthy weight and prevent obesity:  Eat only as much as your body needs. To do this: ? Pay attention to signs that you are hungry or full.  Stop eating as soon as you feel full. ? If you feel hungry, try drinking water first. Drink enough water so your urine is clear or pale yellow. ? Eat smaller portions. ? Look at serving sizes on food labels. Most foods contain more than one serving per container. ? Eat the recommended amount of calories for your gender and activity level. While most active people should eat around 2,000 calories per day, if you are trying to lose weight or are not very active, you main need to eat less calories. Talk to your health care provider or dietitian about how many calories you should eat each day.  Choose healthy foods, such as: ? Fruits and vegetables. Try to fill at least half of  your plate at each meal with fruits and vegetables. ? Whole grains, such as whole wheat bread, brown rice, and quinoa. ? Lean meats, such as chicken or fish. ? Other healthy proteins, such as beans, eggs, or tofu. ? Healthy fats, such as nuts, seeds, fatty fish, and olive oil. ? Low-fat or fat-free dairy.  Check food labels and avoid food and drinks that: ? Are high in calories. ? Have added sugar. ? Are high in sodium. ? Have saturated fats or trans fats.  Limit how much you eat of the following foods: ? Prepackaged meals. ? Fast food. ? Fried foods. ? Processed meat, such as bacon, sausage, and deli meats. ? Fatty cuts of red meat and poultry with skin.  Cook foods in healthier ways, such as by baking, broiling, or grilling.  When grocery shopping, try to shop around the outside of the store. This helps you buy mostly fresh foods and avoid canned and prepackaged foods.  What lifestyle changes can be made?  Exercise at least 30 minutes 5 or more days each week. Exercising includes brisk walking, yard work, biking, running, swimming, and team sports like basketball and soccer. Ask your health care provider which exercises are safe for you.  Do not use any products that contain nicotine or tobacco, such as cigarettes and e-cigarettes. If you need help quitting, ask your health care provider.  Limit alcohol intake to no more than 1 drink a day for nonpregnant women and 2 drinks a day for men. One drink equals 12 oz of beer, 5 oz of wine, or 1 oz of hard liquor.  Try to get 7-9 hours of sleep each night. What other changes can be made?  Keep a food and activity journal to keep track of: ? What you ate and how many calories you had. Remember to count sauces, dressings, and side dishes. ? Whether you were active, and what exercises you did. ? Your calorie, weight, and activity goals.  Check your weight regularly. Track any changes. If you notice you have gained weight, make  changes to your diet or activity routine.  Avoid taking weight-loss medicines or supplements. Talk to your health care provider before starting any new medicine or supplement.  Talk to your health care provider before trying any new diet or exercise plan. Why are these changes important? Eating healthy, staying active, and having healthy habits not only help prevent obesity, they also:  Help you to manage stress and emotions.  Help you to connect with friends and family.  Improve your self-esteem.  Improve your sleep.  Prevent long-term health problems.  What can happen if changes are not made? Being obese or overweight can cause you to develop joint or bone problems, which can make it  hard for you to stay active or do activities you enjoy. Being obese or overweight also puts stress on your heart and lungs and can lead to health problems like diabetes, heart disease, and some cancers. Where to find more information: Talk with your health care provider or a dietitian about healthy eating and healthy lifestyle choices. You may also find other information through these resources:  [place] Department of Agriculture MyPlate: FormerBoss.no  American Heart Association: www.heart.org  Centers for Disease Control and Prevention: http://www.wolf.info/  Summary  Staying at a healthy weight is important. It helps prevent certain diseases and health problems, such as heart disease, diabetes, joint problems, sleep disorders, and some cancers.  Being obese or overweight can cause you to develop joint or bone problems, which can make it hard for you to stay active or do activities you enjoy.  You can prevent unhealthy weight gain by eating a healthy diet, exercising regularly, not smoking, limiting alcohol, and getting enough sleep.  Talk with your health care provider or a dietitian for guidance about healthy eating and healthy lifestyle choices. This information is not intended to replace advice  given to you by your health care provider. Make sure you discuss any questions you have with your health care provider. Document Released: 04/24/2016 Document Revised: 05/30/2016 Document Reviewed: 05/30/2016 Elsevier Interactive Patient Education  2018 [place] Eating Plan DASH stands for "Dietary Approaches to Stop Hypertension." The DASH eating plan is a healthy eating plan that has been shown to reduce high blood pressure (hypertension). It may also reduce your risk for type 2 diabetes, heart disease, and stroke. The DASH eating plan may also help with weight loss. What are tips for following this plan? General guidelines  Avoid eating more than 2,300 mg (milligrams) of salt (sodium) a day. If you have hypertension, you may need to reduce your sodium intake to 1,500 mg a day.  Limit alcohol intake to no more than 1 drink a day for nonpregnant women and 2 drinks a day for men. One drink equals 12 oz of beer, 5 oz of wine, or 1 oz of hard liquor.  Work with your health care provider to maintain a healthy body weight or to lose weight. Ask what an ideal weight is for you.  Get at least 30 minutes of exercise that causes your heart to beat faster (aerobic exercise) most days of the week. Activities may include walking, swimming, or biking.  Work with your health care provider or diet and nutrition specialist (dietitian) to adjust your eating plan to your individual calorie needs. Reading food labels  Check food labels for the amount of sodium per serving. Choose foods with less than 5 percent of the Daily Value of sodium. Generally, foods with less than 300 mg of sodium per serving fit into this eating plan.  To find whole grains, look for the word "whole" as the first word in the ingredient list. Shopping  Buy products labeled as "low-sodium" or "no salt added."  Buy fresh foods. Avoid canned foods and premade or frozen meals. Cooking  Avoid adding salt when cooking. Use  salt-free seasonings or herbs instead of table salt or sea salt. Check with your health care provider or pharmacist before using salt substitutes.  Do not fry foods. Cook foods using healthy methods such as baking, boiling, grilling, and broiling instead.  Cook with heart-healthy oils, such as olive, canola, soybean, or sunflower oil. Meal planning   Eat a balanced diet that includes: ?  5 or more servings of fruits and vegetables each day. At each meal, try to fill half of your plate with fruits and vegetables. ? Up to 6-8 servings of whole grains each day. ? Less than 6 oz of lean meat, poultry, or fish each day. A 3-oz serving of meat is about the same size as a deck of cards. One egg equals 1 oz. ? 2 servings of low-fat dairy each day. ? A serving of nuts, seeds, or beans 5 times each week. ? Heart-healthy fats. Healthy fats called Omega-3 fatty acids are found in foods such as flaxseeds and coldwater fish, like sardines, salmon, and mackerel.  Limit how much you eat of the following: ? Canned or prepackaged foods. ? Food that is high in trans fat, such as fried foods. ? Food that is high in saturated fat, such as fatty meat. ? Sweets, desserts, sugary drinks, and other foods with added sugar. ? Full-fat dairy products.  Do not salt foods before eating.  Try to eat at least 2 vegetarian meals each week.  Eat more home-cooked food and less restaurant, buffet, and fast food.  When eating at a restaurant, ask that your food be prepared with less salt or no salt, if possible. What foods are recommended? The items listed may not be a complete list. Talk with your dietitian about what dietary choices are best for you. Grains Whole-grain or whole-wheat bread. Whole-grain or whole-wheat pasta. Brown rice. [place]. Bulgur. Whole-grain and low-sodium cereals. Pita bread. Low-fat, low-sodium crackers. Whole-wheat flour tortillas. Vegetables Fresh or frozen vegetables (raw, steamed,  roasted, or grilled). Low-sodium or reduced-sodium tomato and vegetable juice. Low-sodium or reduced-sodium tomato sauce and tomato paste. Low-sodium or reduced-sodium canned vegetables. Fruits All fresh, dried, or frozen fruit. Canned fruit in natural juice (without added sugar). Meat and other protein foods Skinless chicken or [place]. Ground chicken or [place]. Pork with fat trimmed off. Fish and seafood. Egg whites. Dried beans, peas, or lentils. Unsalted nuts, nut butters, and seeds. Unsalted canned beans. Lean cuts of beef with fat trimmed off. Low-sodium, lean deli meat. Dairy Low-fat (1%) or fat-free (skim) milk. Fat-free, low-fat, or reduced-fat cheeses. Nonfat, low-sodium ricotta or cottage cheese. Low-fat or nonfat yogurt. Low-fat, low-sodium cheese. Fats and oils Soft margarine without trans fats. Vegetable oil. Low-fat, reduced-fat, or light mayonnaise and salad dressings (reduced-sodium). Canola, safflower, olive, soybean, and sunflower oils. Avocado. Seasoning and other foods Herbs. Spices. Seasoning mixes without salt. Unsalted popcorn and pretzels. Fat-free sweets. What foods are not recommended? The items listed may not be a complete list. Talk with your dietitian about what dietary choices are best for you. Grains Baked goods made with fat, such as croissants, muffins, or some breads. Dry pasta or rice meal packs. Vegetables Creamed or fried vegetables. Vegetables in a cheese sauce. Regular canned vegetables (not low-sodium or reduced-sodium). Regular canned tomato sauce and paste (not low-sodium or reduced-sodium). Regular tomato and vegetable juice (not low-sodium or reduced-sodium). Angie Fava. Olives. Fruits Canned fruit in a light or heavy syrup. Fried fruit. Fruit in cream or butter sauce. Meat and other protein foods Fatty cuts of meat. Ribs. Fried meat. Berniece Salines. Sausage. Bologna and other processed lunch meats. Salami. Fatback. Hotdogs. Bratwurst. Salted nuts and seeds. Canned  beans with added salt. Canned or smoked fish. Whole eggs or egg yolks. Chicken or [place] with skin. Dairy Whole or 2% milk, cream, and half-and-half. Whole or full-fat cream cheese. Whole-fat or sweetened yogurt. Full-fat cheese. Nondairy creamers. Whipped toppings. Processed  cheese and cheese spreads. Fats and oils Butter. Stick margarine. Lard. Shortening. Ghee. Bacon fat. Tropical oils, such as coconut, palm kernel, or palm oil. Seasoning and other foods Salted popcorn and pretzels. Onion salt, garlic salt, seasoned salt, table salt, and sea salt. Worcestershire sauce. Tartar sauce. Barbecue sauce. Teriyaki sauce. Soy sauce, including reduced-sodium. Steak sauce. Canned and packaged gravies. Fish sauce. Oyster sauce. Cocktail sauce. Horseradish that you find on the shelf. Ketchup. Mustard. Meat flavorings and tenderizers. Bouillon cubes. Hot sauce and Tabasco sauce. Premade or packaged marinades. Premade or packaged taco seasonings. Relishes. Regular salad dressings. Where to find more information:  National Heart, Lung, and [place]: https://wilson-eaton.com/  American Heart Association: www.heart.org Summary  The DASH eating plan is a healthy eating plan that has been shown to reduce high blood pressure (hypertension). It may also reduce your risk for type 2 diabetes, heart disease, and stroke.  With the DASH eating plan, you should limit salt (sodium) intake to 2,300 mg a day. If you have hypertension, you may need to reduce your sodium intake to 1,500 mg a day.  When on the DASH eating plan, aim to eat more fresh fruits and vegetables, whole grains, lean proteins, low-fat dairy, and heart-healthy fats.  Work with your health care provider or diet and nutrition specialist (dietitian) to adjust your eating plan to your individual calorie needs. This information is not intended to replace advice given to you by your health care provider. Make sure you discuss any questions you have with your  health care provider. Document Released: 04/12/2011 Document Revised: 04/16/2016 Document Reviewed: 04/16/2016 Elsevier Interactive Patient Education  [employer].

## 2017-08-23 NOTE — Assessment & Plan Note (Signed)
USPSTF grade A and B recommendations reviewed with patient; age-appropriate recommendations, preventive care, screening tests, etc discussed and encouraged; healthy living encouraged; see AVS for patient education given to patient  

## 2017-08-23 NOTE — Assessment & Plan Note (Signed)
One episode; sent for [place] and mammograms; no discharge since; I want her to see a surgeon for breast exam and consideration of additional imaging before August; she agrees

## 2017-08-23 NOTE — Progress Notes (Signed)
BP 140/74   Pulse 90   Temp 98.6 F (37 C) (Oral)   Resp 14   Ht 5' 5" (1.651 m)   Wt 217 lb 9.6 oz (98.7 kg)   LMP 05/07/2009   SpO2 94%   BMI 36.21 kg/m    Subjective:    Patient ID: Vanessa Harvey, female    DOB: [birth year], 57 y.o.   MRN: [id number]  HPI: Vanessa Harvey is a 57 y.o. female  Chief Complaint  Patient presents with  . Annual Exam    HPI   She had both flu viruses this years; first week of March and then again 2 weeks later; the cough hung on and is getting much better; pollen season is here; no fever or SHOB  USPSTF grade A and B recommendations Depression:  Depression screen [facility] 2/9 08/23/2017 12/12/2016 11/29/2015 07/20/2015  Decreased Interest 0 0 0 0  Down, Depressed, Hopeless 0 0 0 0  PHQ - 2 Score 0 0 0 0   Hypertension: blood pressure has been running 140 at home BP Readings from Last 3 Encounters:  08/23/17 140/74  04/08/17 (!) 143/75  04/04/17 (!) 180/81   Obesity: went part-time, but stress eats; that will fix  Wt Readings from Last 3 Encounters:  08/23/17 217 lb 9.6 oz (98.7 kg)  04/08/17 211 lb (95.7 kg)  04/04/17 214 lb (97.1 kg)   BMI Readings from Last 3 Encounters:  08/23/17 36.21 kg/m  04/08/17 35.11 kg/m  04/04/17 35.61 kg/m    Skin cancer: no worrisome; goes to see the derm Lung cancer:  nonsmoker Breast cancer: had nipple discharge; there was some fullness in the lateral right breast; not different from before; no fam hx of breast cancer Colorectal cancer: 2016; next due 2021 Cervical cancer screening: pap smear; no hx of abnormal paps BRCA gene screening: family hx of breast and/or ovarian cancer and/or metastatic prostate cancer? no HIV, hep B, hep C: not intersted STD testing and prevention (chl/gon/syphilis): not interested Intimate partner violence: no abuse Contraception: post-menopausal Osteoporosis: no steroids other than brief course Fall prevention/vitamin D: discussed; weight-bearing exercise; not much  calcium other than milk and cheese Immunizations: new shingles vaccine info Diet: some room for improvement with fatty meats; discussed WHO advisory Exercise: not really Alcohol: rarely Tobacco use: no  AAA: n/a Aspirin: taking occasionally Glucose:  Glucose  Date Value Ref Range Status  07/20/2015 92 65 - 99 mg/dL Final   Glucose, Bld  Date Value Ref Range Status  04/04/2017 103 (H) 65 - 99 mg/dL Final  06/29/2013 88 70 - 99 mg/dL Final  01/19/2010 84 70 - 99 mg/dL Final   Lipids:  Lab Results  Component Value Date   CHOL 164 07/20/2015   CHOL 199 12/22/2008   Lab Results  Component Value Date   HDL 46 07/20/2015   HDL 47.70 12/22/2008   Lab Results  Component Value Date   LDLCALC 101 (H) 07/20/2015   LDLCALC 128 (H) 12/22/2008   Lab Results  Component Value Date   TRIG 84 07/20/2015   TRIG 119.0 12/22/2008   Lab Results  Component Value Date   CHOLHDL 4 12/22/2008   No results found for: LDLDIRECT   Depression screen [facility] 2/9 08/23/2017 12/12/2016 11/29/2015 07/20/2015  Decreased Interest 0 0 0 0  Down, Depressed, Hopeless 0 0 0 0  PHQ - 2 Score 0 0 0 0    Relevant past medical, surgical, family and social history reviewed Past Medical  History:  Diagnosis Date  . Allergic rhinitis, cause unspecified   . Anxiety state, unspecified   . Asthma   . Degenerative disc disease, lumbar   . Depressive disorder, not elsewhere classified   . Lumbago   . Other atopic dermatitis and related conditions   . Other B-complex deficiencies   . Panic disorder without agoraphobia   . Unspecified asthma(493.90)    Past Surgical History:  Procedure Laterality Date  . Abd [place]  09/2005   Negative  . CESAREAN SECTION    . DEXA  06/2004  . ESOPHAGOGASTRODUODENOSCOPY (EGD) WITH PROPOFOL N/A 04/08/2017   Procedure: ESOPHAGOGASTRODUODENOSCOPY (EGD) WITH PROPOFOL;  Surgeon: [name], MD;  Location: [facility];  Service: Endoscopy;  Laterality: N/A;  . GANGLION CYST  EXCISION  12/2005   knee  . KNEE ARTHROSCOPY  1984   evulsion of femur s/p fall   Family History  Problem Relation Age of Onset  . Hypertension Father        Mild  . Ulcerative colitis Father   . Cancer Father        esophageal and colon  . Stroke Father   . COPD Father   . Diabetes Maternal Grandmother   . Heart disease Neg Hx   . Breast cancer Neg Hx    Social History   Tobacco Use  . Smoking status: Never Smoker  . Smokeless tobacco: Never Used  Substance Use Topics  . Alcohol use: Yes    Comment: Rare  . Drug use: No    Interim medical history since last visit reviewed. Allergies and medications reviewed  Review of Systems  Constitutional: Negative for unexpected weight change.  HENT: Negative for hearing loss.   Eyes: Negative for visual disturbance.  Respiratory: Negative for shortness of breath.   Cardiovascular: Negative for chest pain.  Gastrointestinal: Negative for blood in stool.  Endocrine: Negative for polydipsia.  Genitourinary: Negative for hematuria.  Musculoskeletal: Positive for arthralgias (knees, certain joints in hands).  Skin:       No worrisome moles  Allergic/Immunologic: Negative for food allergies.  Neurological: Negative for tremors.  Hematological: Negative for adenopathy. Does not bruise/bleed easily.   Per HPI unless specifically indicated above     Objective:    BP 140/74   Pulse 90   Temp 98.6 F (37 C) (Oral)   Resp 14   Ht 5' 5" (1.651 m)   Wt 217 lb 9.6 oz (98.7 kg)   LMP 05/07/2009   SpO2 94%   BMI 36.21 kg/m   Wt Readings from Last 3 Encounters:  08/23/17 217 lb 9.6 oz (98.7 kg)  04/08/17 211 lb (95.7 kg)  04/04/17 214 lb (97.1 kg)    Physical Exam  Constitutional: She appears well-developed and well-nourished.  HENT:  Head: Normocephalic and atraumatic.  Eyes: Conjunctivae and EOM are normal. Right eye exhibits no hordeolum. Left eye exhibits no hordeolum. No scleral icterus.  Neck: Carotid bruit is not  present. No thyromegaly present.  Cardiovascular: Normal rate, regular rhythm, S1 normal, S2 normal and normal heart sounds.  No extrasystoles are present.  Pulmonary/Chest: Effort normal and breath sounds normal. No respiratory distress. Right breast exhibits mass. Right breast exhibits no inverted nipple, no nipple discharge, no skin change and no tenderness. Left breast exhibits mass. Left breast exhibits no inverted nipple, no nipple discharge, no skin change and no tenderness. No breast swelling, tenderness or discharge. Breasts are symmetrical.    Abdominal: Soft. Normal appearance and bowel sounds  are normal. She exhibits no distension, no abdominal bruit, no pulsatile midline mass and no mass. There is no hepatosplenomegaly. There is no tenderness. No hernia.  Genitourinary: Uterus normal. Pelvic exam was performed with patient prone. There is no rash or lesion on the right labia. There is no rash or lesion on the left labia. Cervix exhibits no motion tenderness. Right adnexum displays no mass, no tenderness and no fullness. Left adnexum displays no mass, no tenderness and no fullness.  Musculoskeletal: Normal range of motion. She exhibits no edema.  Lymphadenopathy:       Head (right side): No submandibular adenopathy present.       Head (left side): No submandibular adenopathy present.    She has no cervical adenopathy.    She has no axillary adenopathy.  Neurological: She is alert. She displays no tremor. No cranial nerve deficit. She exhibits normal muscle tone. Gait normal.  Skin: Skin is warm and dry. No bruising and no ecchymosis noted. No cyanosis. No pallor.  Psychiatric: Her speech is normal and behavior is normal. Thought content normal. Her mood appears not anxious. She does not exhibit a depressed mood.    Results for orders placed or performed during the hospital encounter of 84/53/64  Basic metabolic panel  Result Value Ref Range   Sodium 139 135 - 145 mmol/L   Potassium  3.9 3.5 - 5.1 mmol/L   Chloride 101 101 - 111 mmol/L   CO2 25 22 - 32 mmol/L   Glucose, Bld 103 (H) 65 - 99 mg/dL   BUN 14 6 - 20 mg/dL   Creatinine, Ser 0.71 0.44 - 1.00 mg/dL   Calcium 9.3 8.9 - 10.3 mg/dL   GFR calc non Af Amer >60 >60 mL/min   GFR calc Af Amer >60 >60 mL/min   Anion gap 13 5 - 15  CBC  Result Value Ref Range   WBC 10.5 3.6 - 11.0 K/uL   RBC 4.76 3.80 - 5.20 MIL/uL   Hemoglobin 12.4 12.0 - 16.0 g/dL   HCT 37.9 35.0 - 47.0 %   MCV 79.6 (L) 80.0 - 100.0 fL   MCH 26.0 26.0 - 34.0 pg   MCHC 32.7 32.0 - 36.0 g/dL   RDW 15.4 (H) 11.5 - 14.5 %   Platelets 381 150 - 440 K/uL  Troponin I  Result Value Ref Range   Troponin I <0.03 <0.03 ng/mL      Assessment & Plan:   Problem List Items Addressed This Visit      Cardiovascular and Mediastinum   Essential hypertension, benign    Discussed options, including DASH guidelines, weight loss and/or medicines; she opts for weight loss, DASH guidelines; close f/u in 6 weeks        Digestive   Herpes labialis    Avoid triggers; used to take acyclovir; four a year        Other   Preventative health care - Primary    USPSTF grade A and B recommendations reviewed with patient; age-appropriate recommendations, preventive care, screening tests, etc discussed and encouraged; healthy living encouraged; see AVS for patient education given to patient      Relevant Orders   CBC with Differential/Platelet   COMPLETE METABOLIC PANEL WITH GFR   Lipid panel   TSH   Obesity    Encouraged weight loss      Breast discharge    One episode; sent for [place] and mammograms; no discharge since; I want her to see a surgeon  for breast exam and consideration of additional imaging before August; she agrees       Other Visit Diagnoses    Screening for breast cancer       Abnormal breast exam       Relevant Orders   Ambulatory referral to General Surgery   Screening for cervical cancer       Relevant Orders   Pap IG and HPV (high  risk) DNA detection   Vaginal discharge       Relevant Orders   WET PREP BY MOLECULAR PROBE       Follow up plan: Return in about 1 year (around 08/24/2018) for complete physical; 6 weeks with Dr. Sanda Klein.  An after-visit summary was printed and given to the patient at [facility].  Please see the patient instructions which may contain other information and recommendations beyond what is mentioned above in the assessment and plan.  No orders of the defined types were placed in this encounter.   Orders Placed This Encounter  Procedures  . WET PREP BY MOLECULAR PROBE  . CBC with Differential/Platelet  . COMPLETE METABOLIC PANEL WITH GFR  . Lipid panel  . TSH  . Ambulatory referral to General Surgery

## 2017-08-23 NOTE — Assessment & Plan Note (Signed)
Avoid triggers; used to take acyclovir; four a year

## 2017-08-23 NOTE — Assessment & Plan Note (Signed)
Encouraged weight loss 

## 2017-08-24 LAB — WET PREP BY MOLECULAR PROBE
Candida species: NOT DETECTED
MICRO NUMBER: 90483819
SOURCE:: 0
SPECIMEN QUALITY: ADEQUATE
Trichomonas vaginosis: NOT DETECTED

## 2017-08-26 ENCOUNTER — Encounter: Payer: Self-pay | Admitting: *Deleted

## 2017-08-26 LAB — PAP IG AND HPV HIGH-RISK: HPV DNA HIGH RISK: NOT DETECTED

## 2017-09-26 ENCOUNTER — Ambulatory Visit (INDEPENDENT_AMBULATORY_CARE_PROVIDER_SITE_OTHER): Payer: 59 | Admitting: General Surgery

## 2017-09-26 ENCOUNTER — Encounter: Payer: Self-pay | Admitting: General Surgery

## 2017-09-26 VITALS — BP 164/82 | HR 86 | Resp 12 | Ht 65.0 in | Wt 211.0 lb

## 2017-09-26 DIAGNOSIS — N6452 Nipple discharge: Secondary | ICD-10-CM | POA: Insufficient documentation

## 2017-09-26 NOTE — Patient Instructions (Signed)
Patient to return as needed. The patient is aware to call back for any questions or concerns. 

## 2017-09-26 NOTE — Progress Notes (Signed)
Patient ID: Vanessa Harvey, female   DOB: 1960/06/20, 57 y.o.   MRN: [id number]  Chief Complaint  Patient presents with  . Breast Problem    HPI Vanessa Harvey is a 57 y.o. female.  who presents for a breast evaluation referred by Dr Sanda Klein. The most recent mammogram and right breast ultrasound was done on 12-21-16. Patient had some discharge in her right breast before her mammogram. Something she has clear discharge.  This first came to her attention after vigorous breast manipulation during sexual relations.  She is only seen drainage since that time while squeezing the right nipple.  She has not checked the left side. Patient does perform regular self breast checks and gets regular mammograms done.     HPI  Past Medical History:  Diagnosis Date  . Allergic rhinitis, cause unspecified   . Anxiety state, unspecified   . Asthma   . Degenerative disc disease, lumbar   . Depressive disorder, not elsewhere classified   . GERD (gastroesophageal reflux disease)   . Lumbago   . Other atopic dermatitis and related conditions   . Other B-complex deficiencies   . Panic disorder without agoraphobia   . Unspecified asthma(493.90)     Past Surgical History:  Procedure Laterality Date  . Abd [place]  09/2005   Negative  . CESAREAN SECTION  1993  . DEXA  06/2004  . ESOPHAGOGASTRODUODENOSCOPY (EGD) WITH PROPOFOL N/A 04/08/2017   Procedure: ESOPHAGOGASTRODUODENOSCOPY (EGD) WITH PROPOFOL;  Surgeon: [name], MD;  Location: [facility];  Service: Endoscopy;  Laterality: N/A;  . GANGLION CYST EXCISION  12/2005   knee  . KNEE ARTHROSCOPY  1984   evulsion of femur s/p fall    Family History  Problem Relation Age of Onset  . Hypertension Father        Mild  . Ulcerative colitis Father   . Cancer Father        esophageal and colon  . Stroke Father   . COPD Father   . Diabetes Maternal Grandmother   . Heart disease Neg Hx   . Breast cancer Neg Hx     Social History Social History    Tobacco Use  . Smoking status: Never Smoker  . Smokeless tobacco: Never Used  Substance Use Topics  . Alcohol use: Yes    Comment: Rare  . Drug use: No    Allergies  Allergen Reactions  . Penicillins     Has patient had a PCN reaction causing immediate rash, facial/tongue/throat swelling, SOB or lightheadedness with hypotension: No Has patient had a PCN reaction causing severe rash involving mucus membranes or skin necrosis: Unknown Has patient had a PCN reaction that required hospitalization: Unknown Has patient had a PCN reaction occurring within the last 10 years: Unknown If all of the above answers are "NO", then may proceed with Cephalosporin use.     Current Outpatient Medications  Medication Sig Dispense Refill  . albuterol (PROVENTIL HFA) 108 (90 Base) MCG/ACT inhaler Inhale 2 puffs into the lungs every 4 (four) hours as needed. 1 Inhaler 0  . ALPRAZolam (XANAX) 0.5 MG tablet Take 0.5 mg by mouth 2 (two) times daily as needed for anxiety.    . Cyanocobalamin (VITAMIN B-12) 1000 MCG SUBL Place 1 tablet (1,000 mcg total) under the tongue daily. 30 tablet 11  . esomeprazole (NEXIUM) 20 MG capsule Take 1 capsule (20 mg total) by mouth daily as needed.    Marland Kitchen FLUoxetine (PROZAC) 20 MG capsule Take  1 capsule (20 mg total) by mouth daily. 30 capsule 6  . fluticasone (FLONASE) 50 MCG/ACT nasal spray Place 2 sprays into the nose daily. 16 g 11  . hydrocortisone (ANUSOL-HC) 25 MG suppository Place 1 suppository (25 mg total) rectally 2 (two) times daily. (Patient taking differently: Place 25 mg rectally 2 (two) times daily as needed. ) 12 suppository 0  . montelukast (SINGULAIR) 10 MG tablet take 1 tablet by mouth once daily 30 tablet 6  . Multiple Vitamin (MULTIVITAMIN) tablet Take 1 tablet by mouth daily.      . sucralfate (CARAFATE) 1 g tablet Take 1 tablet (1 g total) by mouth 4 (four) times daily for 10 days. 40 tablet 0   No current facility-administered medications for this  visit.     Review of Systems Review of Systems  Constitutional: Negative.   Respiratory: Negative.   Cardiovascular: Negative.     Blood pressure (!) 164/82, pulse 86, resp. rate 12, height 5\' 5"  (1.651 m), weight 211 lb (95.7 kg), last menstrual period 05/07/2009.  Physical Exam Physical Exam  Constitutional: She is oriented to person, place, and time. She appears well-developed and well-nourished.  Eyes: Conjunctivae are normal. No scleral icterus.  Neck: Neck supple.  Cardiovascular: Normal rate, regular rhythm and normal heart sounds.  Pulmonary/Chest: Effort normal and breath sounds normal. Right breast exhibits nipple discharge. Right breast exhibits no inverted nipple, no mass, no skin change and no tenderness. Left breast exhibits nipple discharge (single drop drainage). Left breast exhibits no inverted nipple, no mass, no skin change and no tenderness. Breasts are asymmetrical (left breast bigger than right ).  Single duct drainage from each breast.  Lymphadenopathy:    She has no cervical adenopathy.    She has no axillary adenopathy.  Neurological: She is alert and oriented to person, place, and time.  Skin: Skin is warm and dry.    Data Reviewed Bilateral mammograms and right breast ultrasound of December 21, 2016 were reviewed.  BI-RADS-1.     Assessment    Bilateral clear nipple drainage with manipulation.      Plan  Patient to return as needed. The patient is aware to call back for any questions or concerns.  The patient was discouraged from manipulating the nipples during her regular self examinations.  No contraindication to breast stimulation during sexual relations.  She was encouraged to call should she develop spontaneous drainage or any bloody nipple drainage.  Repeat ultrasound would be appropriate at that time.   HPI, Physical Exam, Assessment and Plan have been scribed under the direction and in the presence of Hervey Ard, MD.  Gaspar Cola, CMA   Forest Gleason Arlett Goold 09/26/2017, 9:51 PM

## 2017-09-30 ENCOUNTER — Other Ambulatory Visit: Payer: Self-pay | Admitting: Family Medicine

## 2017-10-04 ENCOUNTER — Ambulatory Visit: Payer: 59 | Admitting: Family Medicine

## 2017-10-18 ENCOUNTER — Other Ambulatory Visit: Payer: Self-pay

## 2017-10-18 DIAGNOSIS — Z0000 Encounter for general adult medical examination without abnormal findings: Secondary | ICD-10-CM | POA: Diagnosis not present

## 2017-10-18 LAB — COMPLETE METABOLIC PANEL WITH GFR
AG RATIO: 1.5 (calc) (ref 1.0–2.5)
ALT: 15 U/L (ref 6–29)
AST: 22 U/L (ref 10–35)
Albumin: 4.3 g/dL (ref 3.6–5.1)
Alkaline phosphatase (APISO): 85 U/L (ref 33–130)
BUN: 17 mg/dL (ref 7–25)
CALCIUM: 9.5 mg/dL (ref 8.6–10.4)
CO2: 28 mmol/L (ref 20–32)
Chloride: 103 mmol/L (ref 98–110)
Creat: 0.82 mg/dL (ref 0.50–1.05)
GFR, EST AFRICAN AMERICAN: 91 mL/min/{1.73_m2} (ref >=60)
GFR, EST NON AFRICAN AMERICAN: 79 mL/min/{1.73_m2} (ref >=60)
GLUCOSE: 98 mg/dL (ref 65–99)
Globulin: 2.9 g/dL (calc) (ref 1.9–3.7)
Potassium: 4.5 mmol/L (ref 3.5–5.3)
Sodium: 139 mmol/L (ref 135–146)
TOTAL PROTEIN: 7.2 g/dL (ref 6.1–8.1)
Total Bilirubin: 0.4 mg/dL (ref 0.2–1.2)

## 2017-10-18 LAB — CBC WITH DIFFERENTIAL/PLATELET
BASOS ABS: 39 {cells}/uL (ref 0–200)
Basophils Relative: 0.5 %
EOS ABS: 390 {cells}/uL (ref 15–500)
Eosinophils Relative: 5 %
HEMATOCRIT: 36 % (ref 35.0–45.0)
HEMOGLOBIN: 11.9 g/dL (ref 11.7–15.5)
LYMPHS ABS: 2449 {cells}/uL (ref 850–3900)
MCH: 25.4 pg — AB (ref 27.0–33.0)
MCHC: 33.1 g/dL (ref 32.0–36.0)
MCV: 76.8 fL — ABNORMAL LOW (ref 80.0–100.0)
MPV: 9 fL (ref 7.5–12.5)
Monocytes Relative: 8.5 %
NEUTROS ABS: 4259 {cells}/uL (ref 1500–7800)
Neutrophils Relative %: 54.6 %
Platelets: 426 10*3/uL — ABNORMAL HIGH (ref 140–400)
RBC: 4.69 10*6/uL (ref 3.80–5.10)
RDW: 14.4 % (ref 11.0–15.0)
Total Lymphocyte: 31.4 %
WBC: 7.8 10*3/uL (ref 3.8–10.8)
WBCMIX: 663 {cells}/uL (ref 200–950)

## 2017-10-18 LAB — LIPID PANEL
Cholesterol: 179 mg/dL (ref <200)
HDL: 39 mg/dL — AB (ref >50)
LDL Cholesterol (Calc): 120 mg/dL (calc) — ABNORMAL HIGH
NON-HDL CHOLESTEROL (CALC): 140 mg/dL — AB (ref <130)
Total CHOL/HDL Ratio: 4.6 (calc) (ref <5.0)
Triglycerides: 94 mg/dL (ref <150)

## 2017-10-18 LAB — TSH: TSH: 1.8 mIU/L (ref 0.40–4.50)

## 2017-10-25 ENCOUNTER — Ambulatory Visit: Payer: 59 | Admitting: Family Medicine

## 2017-10-25 ENCOUNTER — Encounter: Payer: Self-pay | Admitting: Family Medicine

## 2017-10-25 VITALS — BP 134/64 | HR 94 | Temp 99.3°F | Resp 12 | Ht 65.0 in | Wt 211.5 lb

## 2017-10-25 DIAGNOSIS — B9689 Other specified bacterial agents as the cause of diseases classified elsewhere: Secondary | ICD-10-CM | POA: Diagnosis not present

## 2017-10-25 DIAGNOSIS — F410 Panic disorder [episodic paroxysmal anxiety] without agoraphobia: Secondary | ICD-10-CM

## 2017-10-25 DIAGNOSIS — R718 Other abnormality of red blood cells: Secondary | ICD-10-CM | POA: Diagnosis not present

## 2017-10-25 DIAGNOSIS — R200 Anesthesia of skin: Secondary | ICD-10-CM

## 2017-10-25 DIAGNOSIS — E6609 Other obesity due to excess calories: Secondary | ICD-10-CM | POA: Diagnosis not present

## 2017-10-25 DIAGNOSIS — N6452 Nipple discharge: Secondary | ICD-10-CM | POA: Diagnosis not present

## 2017-10-25 DIAGNOSIS — E786 Lipoprotein deficiency: Secondary | ICD-10-CM

## 2017-10-25 DIAGNOSIS — N760 Acute vaginitis: Secondary | ICD-10-CM | POA: Diagnosis not present

## 2017-10-25 DIAGNOSIS — Z6835 Body mass index (BMI) 35.0-35.9, adult: Secondary | ICD-10-CM | POA: Diagnosis not present

## 2017-10-25 DIAGNOSIS — F411 Generalized anxiety disorder: Secondary | ICD-10-CM

## 2017-10-25 DIAGNOSIS — N182 Chronic kidney disease, stage 2 (mild): Secondary | ICD-10-CM | POA: Diagnosis not present

## 2017-10-25 HISTORY — DX: Lipoprotein deficiency: E78.6

## 2017-10-25 MED ORDER — METRONIDAZOLE 0.75 % VA GEL: 1.0000 | Freq: Two times a day (BID) | VAGINAL | 0 refills | Status: DC

## 2017-10-25 MED ORDER — ALPRAZOLAM 0.5 MG PO TABS: 0.2500 mg | ORAL_TABLET | Freq: Two times a day (BID) | ORAL | 0 refills | Status: DC | PRN

## 2017-10-25 NOTE — Progress Notes (Signed)
BP 134/64   Pulse 94   Temp 99.3 F (37.4 C) (Oral)   Resp 12   Ht 5\' 5"  (1.651 m)   Wt 211 lb 8 oz (95.9 kg)   LMP 05/07/2009   SpO2 94%   BMI 35.20 kg/m    Subjective:    Patient ID: Vanessa Harvey, female    DOB: 20-Mar-1961, 57 y.o.   MRN: [id number]  HPI: Vanessa Harvey is a 57 y.o. female  Chief Complaint  Patient presents with  . Follow-up  . Medication Refill  . Labs Only    lab review    HPI Patient is here for follow-up High blood pressure noted at previous visit; she has been trying to lose weight and follow the DASH guidelines  She had labs done on 10/18/2017 and we're reviewing those together today Getting better with greens Mild anemia and microcytosis over the year since 2011  Since last visit, she saw the surgeon about breast discharge and was told there was nothing to worry about  She has a bottle of xanax from 2014 from another provider; she only takes like two a year; very rarely; asked if she could get a refill for stormy moments  Facial numbness; saw specialist, had MRI, had panorex; just comes and goes  Depression screen [facility] 2/9 10/25/2017 10/25/2017 08/23/2017 12/12/2016 11/29/2015  Decreased Interest 0 0 0 0 0  Down, Depressed, Hopeless 0 0 0 0 0  PHQ - 2 Score 0 0 0 0 0  Altered sleeping 0 - - - -  Tired, decreased energy 0 - - - -  Change in appetite 0 - - - -  Feeling bad or failure about yourself  0 - - - -  Trouble concentrating 0 - - - -  Moving slowly or fidgety/restless 0 - - - -  Suicidal thoughts 0 - - - -  PHQ-9 Score 0 - - - -  Difficult doing work/chores Not difficult at all - - - -    Relevant past medical, surgical, family and social history reviewed Past Medical History:  Diagnosis Date  . Allergic rhinitis, cause unspecified   . Anxiety state, unspecified   . Asthma   . Degenerative disc disease, lumbar   . Depressive disorder, not elsewhere classified   . GERD (gastroesophageal reflux disease)   . Lumbago   .  Other atopic dermatitis and related conditions   . Other B-complex deficiencies   . Panic disorder without agoraphobia   . Unspecified asthma(493.90)    Past Surgical History:  Procedure Laterality Date  . Abd [place]  09/2005   Negative  . CESAREAN SECTION  1993  . DEXA  06/2004  . ESOPHAGOGASTRODUODENOSCOPY (EGD) WITH PROPOFOL N/A 04/08/2017   Procedure: ESOPHAGOGASTRODUODENOSCOPY (EGD) WITH PROPOFOL;  Surgeon: [name], MD;  Location: [facility];  Service: Endoscopy;  Laterality: N/A;  . GANGLION CYST EXCISION  12/2005   knee  . KNEE ARTHROSCOPY  1984   evulsion of femur s/p fall   Family History  Problem Relation Age of Onset  . Hypertension Father        Mild  . Ulcerative colitis Father   . Cancer Father        esophageal and colon  . Stroke Father   . COPD Father   . Diabetes Maternal Grandmother   . Heart disease Neg Hx   . Breast cancer Neg Hx    Social History   Tobacco Use  . Smoking  status: Never Smoker  . Smokeless tobacco: Never Used  Substance Use Topics  . Alcohol use: Yes    Comment: Rare  . Drug use: No   Interim medical history since last visit reviewed. Allergies and medications reviewed  Review of Systems Per HPI unless specifically indicated above     Objective:    BP 134/64   Pulse 94   Temp 99.3 F (37.4 C) (Oral)   Resp 12   Ht 5\' 5"  (1.651 m)   Wt 211 lb 8 oz (95.9 kg)   LMP 05/07/2009   SpO2 94%   BMI 35.20 kg/m   Wt Readings from Last 3 Encounters:  10/25/17 211 lb 8 oz (95.9 kg)  09/26/17 211 lb (95.7 kg)  08/23/17 217 lb 9.6 oz (98.7 kg)    BP Readings from Last 3 Encounters:  10/25/17 134/64  09/26/17 (!) 164/82  08/23/17 140/74    Physical Exam  Constitutional: She appears well-developed and well-nourished.  HENT:  Mouth/Throat: Mucous membranes are normal.  Eyes: EOM are normal. No scleral icterus.  Cardiovascular: Normal rate and regular rhythm.  Pulmonary/Chest: Effort normal and breath sounds normal.   Psychiatric: She has a normal mood and affect. Her behavior is normal.    Results for orders placed or performed in visit on 10/18/17  TSH  Result Value Ref Range   TSH 1.80 0.40 - 4.50 mIU/L  Lipid panel  Result Value Ref Range   Cholesterol 179 <200 mg/dL   HDL 39 (L) >50 mg/dL   Triglycerides 94 <150 mg/dL   LDL Cholesterol (Calc) 120 (H) mg/dL (calc)   Total CHOL/HDL Ratio 4.6 <5.0 (calc)   Non-HDL Cholesterol (Calc) 140 (H) <130 mg/dL (calc)  COMPLETE METABOLIC PANEL WITH GFR  Result Value Ref Range   Glucose, Bld 98 65 - 99 mg/dL   BUN 17 7 - 25 mg/dL   Creat 0.82 0.50 - 1.05 mg/dL   GFR, Est Non African American 79 > OR = 60 mL/min/1.54m2   GFR, Est African American 91 > OR = 60 mL/min/1.4m2   BUN/Creatinine Ratio NOT APPLICABLE 6 - 22 (calc)   Sodium 139 135 - 146 mmol/L   Potassium 4.5 3.5 - 5.3 mmol/L   Chloride 103 98 - 110 mmol/L   CO2 28 20 - 32 mmol/L   Calcium 9.5 8.6 - 10.4 mg/dL   Total Protein 7.2 6.1 - 8.1 g/dL   Albumin 4.3 3.6 - 5.1 g/dL   Globulin 2.9 1.9 - 3.7 g/dL (calc)   AG Ratio 1.5 1.0 - 2.5 (calc)   Total Bilirubin 0.4 0.2 - 1.2 mg/dL   Alkaline phosphatase (APISO) 85 33 - 130 U/L   AST 22 10 - 35 U/L   ALT 15 6 - 29 U/L  CBC with Differential/Platelet  Result Value Ref Range   WBC 7.8 3.8 - 10.8 Thousand/uL   RBC 4.69 3.80 - 5.10 Million/uL   Hemoglobin 11.9 11.7 - 15.5 g/dL   HCT 36.0 35.0 - 45.0 %   MCV 76.8 (L) 80.0 - 100.0 fL   MCH 25.4 (L) 27.0 - 33.0 pg   MCHC 33.1 32.0 - 36.0 g/dL   RDW 14.4 11.0 - 15.0 %   Platelets 426 (H) 140 - 400 Thousand/uL   MPV 9.0 7.5 - 12.5 fL   Neutro Abs 4,259 1,500 - 7,800 cells/uL   Lymphs Abs 2,449 850 - 3,900 cells/uL   WBC mixed population 663 200 - 950 cells/uL   Eosinophils Absolute 390  15 - 500 cells/uL   Basophils Absolute 39 0 - 200 cells/uL   Neutrophils Relative % 54.6 %   Total Lymphocyte 31.4 %   Monocytes Relative 8.5 %   Eosinophils Relative 5.0 %   Basophils Relative 0.5 %       Assessment & Plan:   Problem List Items Addressed This Visit      Genitourinary   CKD (chronic kidney disease) stage 2, GFR 60-89 ml/min    Avoid NSAIDs; stay hydrated        Other   Right facial numbness    Saw ENT and had MRI of the brain; comes and goes; panorex at work      RBC microcytosis - Primary    Suspect iron deficiency; colonoscopy UTD; start iron therapy and recheck CBC and ferritin and TIBC and iron in 2 months      Relevant Orders   CBC with Differential/Platelet   Fe+TIBC+Fer   Obesity    She is down more than 6 pounds since our last visit; praise given; keep up the good work; weight loss will help raise the HDL      Nipple discharge in female    Already checked out by surgeon      Low HDL (under 40)    Ran the ASCVD calculator and statin and aspirin are NOT recommended; she will raise the HDL with weight loss alone      Anxiety state    Rare use of benzo; discussed with patient; do not take with alcohol or narcotic pain medicine or cough medicine      Relevant Medications   ALPRAZolam (XANAX) 0.5 MG tablet   PANIC DISORDER    Willing to give very limited benzo; gave her copy of Aug 2016 warning from FDA about combining benzos with naroctics, risk of death      Relevant Medications   ALPRAZolam (XANAX) 0.5 MG tablet    Other Visit Diagnoses    BV (bacterial vaginosis)       noninfectious, no need for partner to be treatd; metrogel Rxd       Follow up plan: No follow-ups on file.  An after-visit summary was printed and given to the patient at [place].  Please see the patient instructions which may contain other information and recommendations beyond what is mentioned above in the assessment and plan.  Meds ordered this encounter  Medications  . metroNIDAZOLE (METROGEL VAGINAL) 0.75 % vaginal gel    Sig: Place 1 Applicatorful vaginally 2 (two) times daily.    Dispense:  70 g    Refill:  0  . ALPRAZolam (XANAX) 0.5 MG tablet     Sig: Take 0.5-1 tablets (0.25-0.5 mg total) by mouth 2 (two) times daily as needed for anxiety.    Dispense:  5 tablet    Refill:  0    PMP Aware [place] reviewed; very limited    Orders Placed This Encounter  Procedures  . CBC with Differential/Platelet  . Fe+TIBC+Fer

## 2017-10-25 NOTE — Assessment & Plan Note (Signed)
Suspect iron deficiency; colonoscopy UTD; start iron therapy and recheck CBC and ferritin and TIBC and iron in 2 months

## 2017-10-25 NOTE — Assessment & Plan Note (Signed)
She is down more than 6 pounds since our last visit; praise given; keep up the good work; weight loss will help raise the HDL

## 2017-10-25 NOTE — Assessment & Plan Note (Signed)
Avoid NSAIDs; stay hydrated 

## 2017-10-25 NOTE — Assessment & Plan Note (Signed)
Saw ENT and had MRI of the brain; comes and goes; panorex at work

## 2017-10-25 NOTE — Assessment & Plan Note (Signed)
Rare use of benzo; discussed with patient; do not take with alcohol or narcotic pain medicine or cough medicine

## 2017-10-25 NOTE — Assessment & Plan Note (Signed)
Already checked out by surgeon

## 2017-10-25 NOTE — Assessment & Plan Note (Signed)
Ran the ASCVD calculator and statin and aspirin are NOT recommended; she will raise the HDL with weight loss alone

## 2017-10-25 NOTE — Patient Instructions (Addendum)
Increase iron intake or add ferrous sulfate 324 or 325 mg once a day (or it may say elemental iron 65 mg); take with vitamin C on empty stomach  Recheck labs in 2 months, around August 21st To raise the HDL, weight loss and walking (exercise) Use the new vaginal medicine  Try to follow the DASH guidelines (DASH stands for Dietary Approaches to Stop Hypertension). Try to limit the sodium in your diet to no more than 1,500mg  of sodium per day. Certainly try to not exceed 2,000 mg per day at the very most. Do not add salt when cooking or at the table.  Check the sodium amount on labels when shopping, and choose items lower in sodium when given a choice. Avoid or limit foods that already contain a lot of sodium. Eat a diet rich in fruits and vegetables and whole grains, and try to lose weight if overweight or obese  Contact me if top blood pressure number is over 140

## 2017-10-30 NOTE — Assessment & Plan Note (Signed)
Willing to give very limited benzo; gave her copy of Aug 2016 warning from FDA about combining benzos with naroctics, risk of death

## 2017-12-31 ENCOUNTER — Ambulatory Visit (INDEPENDENT_AMBULATORY_CARE_PROVIDER_SITE_OTHER): Payer: 59 | Admitting: Family Medicine

## 2017-12-31 ENCOUNTER — Encounter: Payer: Self-pay | Admitting: Family Medicine

## 2017-12-31 VITALS — BP 124/68 | HR 78 | Temp 98.2°F | Resp 16 | Ht 65.0 in | Wt 219.2 lb

## 2017-12-31 DIAGNOSIS — J4521 Mild intermittent asthma with (acute) exacerbation: Secondary | ICD-10-CM

## 2017-12-31 MED ORDER — DOXYCYCLINE HYCLATE 100 MG PO TABS: 100.0000 mg | ORAL_TABLET | Freq: Two times a day (BID) | ORAL | 0 refills | Status: AC

## 2017-12-31 MED ORDER — BUDESONIDE-FORMOTEROL FUMARATE 80-4.5 MCG/ACT IN AERO: 2.0000 | INHALATION_SPRAY | Freq: Two times a day (BID) | RESPIRATORY_TRACT | 0 refills | Status: DC

## 2017-12-31 MED ORDER — ALBUTEROL SULFATE HFA 108 (90 BASE) MCG/ACT IN AERS: 2.0000 | INHALATION_SPRAY | RESPIRATORY_TRACT | 0 refills | Status: DC | PRN

## 2017-12-31 NOTE — Patient Instructions (Addendum)
Continue Mucinex.  Take Symbicort inhaler and doxycycline twice a day  Use Albuterol Inhaler as needed every 4-6 hours.

## 2017-12-31 NOTE — Progress Notes (Signed)
Name: Vanessa Harvey   MRN: [id number]    DOB: June 05, 1960   Date:12/31/2017       Progress Note  Subjective  Chief Complaint  Chief Complaint  Patient presents with  . Nasal Congestion    chest congestion and cough for 2 weeks    HPI  Pt presents with concern for ongoing chest congestion, nasal congestion, and cough x2 weeks. She had started feeling better - no longer has a fever, but she is now having green sinus drainage and a congested cough that is non-productive.  Endorses shortness of breath with exertion, mild ear pain/pressure.  Denies chest pain, abdominal pain, NVD.  Has taken pseudafed and guaifenesin, honey, etc. Without relief.  She is taking singulair daily; flonase only PRN.  - She has a history of asthma - she needs a refill of her albuterol inhaler.  Patient Active Problem List   Diagnosis Date Noted  . RBC microcytosis 10/25/2017  . CKD (chronic kidney disease) stage 2, GFR 60-89 ml/min 10/25/2017  . Low HDL (under 40) 10/25/2017  . Nipple discharge in female 09/26/2017  . Herpes labialis 08/23/2017  . Essential hypertension, benign 08/23/2017  . Problems with swallowing and mastication   . Breast discharge 12/12/2016  . Family history of stroke 12/12/2016  . Poor concentration 11/29/2015  . Internal hemorrhoid 11/29/2015  . Right facial numbness 11/29/2015  . Preventative health care 07/21/2015  . Obesity 07/06/2015  . Numbness in right leg 07/06/2015  . Submandibular lymphadenopathy 06/29/2013  . Other malaise and fatigue 06/29/2013  . Routine general medical examination at a health care facility 02/21/2012  . GERD (gastroesophageal reflux disease) 09/08/2010  . BACK PAIN, LUMBAR 03/29/2010  . VITAMIN B12 DEFICIENCY 01/24/2010  . Anxiety state 12/22/2008  . PANIC DISORDER 01/08/2007  . DEPRESSION 01/08/2007  . ALLERGIC RHINITIS 01/08/2007  . Asthma, mild intermittent 01/08/2007  . DERMATITIS, OTHER ATOPIC 12/19/2006    Social History   Tobacco  Use  . Smoking status: Never Smoker  . Smokeless tobacco: Never Used  Substance Use Topics  . Alcohol use: Yes    Comment: Rare     Current Outpatient Medications:  .  albuterol (PROVENTIL HFA) 108 (90 Base) MCG/ACT inhaler, Inhale 2 puffs into the lungs every 4 (four) hours as needed., Disp: 1 Inhaler, Rfl: 0 .  ALPRAZolam (XANAX) 0.5 MG tablet, Take 0.5-1 tablets (0.25-0.5 mg total) by mouth 2 (two) times daily as needed for anxiety., Disp: 5 tablet, Rfl: 0 .  Cyanocobalamin (VITAMIN B-12) 1000 MCG SUBL, Place 1 tablet (1,000 mcg total) under the tongue daily., Disp: 30 tablet, Rfl: 11 .  esomeprazole (NEXIUM) 20 MG capsule, Take 1 capsule (20 mg total) by mouth daily as needed., Disp: , Rfl:  .  FLUoxetine (PROZAC) 20 MG capsule, TAKE 1 CAPSULE BY MOUTH ONCE DAILY, Disp: 30 capsule, Rfl: 11 .  fluticasone (FLONASE) 50 MCG/ACT nasal spray, Place 2 sprays into the nose daily., Disp: 16 g, Rfl: 11 .  hydrocortisone (ANUSOL-HC) 25 MG suppository, Place 1 suppository (25 mg total) rectally 2 (two) times daily. (Patient taking differently: Place 25 mg rectally 2 (two) times daily as needed. ), Disp: 12 suppository, Rfl: 0 .  montelukast (SINGULAIR) 10 MG tablet, TAKE 1 TABLET BY MOUTH ONCE DAILY, Disp: 30 tablet, Rfl: 11 .  Multiple Vitamin (MULTIVITAMIN) tablet, Take 1 tablet by mouth daily.  , Disp: , Rfl:  .  metroNIDAZOLE (METROGEL VAGINAL) 0.75 % vaginal gel, Place 1 Applicatorful vaginally 2 (two)  times daily. (Patient not taking: Reported on 12/31/2017), Disp: 70 g, Rfl: 0  Allergies  Allergen Reactions  . Penicillins     Has patient had a PCN reaction causing immediate rash, facial/tongue/throat swelling, SOB or lightheadedness with hypotension: No Has patient had a PCN reaction causing severe rash involving mucus membranes or skin necrosis: Unknown Has patient had a PCN reaction that required hospitalization: Unknown Has patient had a PCN reaction occurring within the last 10 years:  Unknown If all of the above answers are "NO", then may proceed with Cephalosporin use.     ROS  Constitutional: Negative for fever or weight change.  Respiratory: Negative for cough and shortness of breath.   Cardiovascular: Negative for chest pain or palpitations.  Gastrointestinal: Negative for abdominal pain, no bowel changes.  Musculoskeletal: Negative for gait problem or joint swelling.  Skin: Negative for rash.  Neurological: Negative for dizziness or headache.  No other specific complaints in a complete review of systems (except as listed in HPI above).  Objective  Vitals:   12/31/17 0801  BP: 124/68  Pulse: 78  Resp: 16  Temp: 98.2 F (36.8 C)  TempSrc: Oral  SpO2: 96%  Weight: 219 lb 3.2 oz (99.4 kg)  Height: 5\' 5"  (1.651 m)   Body mass index is 36.48 kg/m.  Nursing Note and Vital Signs reviewed.  Physical Exam  Constitutional: Patient appears well-developed and well-nourished. No distress.  HENT: Head: Normocephalic and atraumatic. Ears: B TMs ok, no erythema or effusion; Nose: Nose normal. Mouth/Throat: Oropharynx is clear and moist. No oropharyngeal exudate.  Eyes: Conjunctivae and EOM are normal. Pupils are equal, round, and reactive to light. No scleral icterus.  Neck: Normal range of motion. Neck supple. No JVD present. No thyromegaly present.  Cardiovascular: Normal rate, regular rhythm and normal heart sounds.  No murmur heard. No BLE edema. Pulmonary/Chest: Effort normal and breath sounds show BUL have coarse lung sounds - no wheezing. No respiratory distress. Musculoskeletal: Normal range of motion, no joint effusions. No gross deformities Neurological: he is alert and oriented to person, place, and time. No cranial nerve deficit. Coordination, balance, strength, speech and gait are normal.  Skin: Skin is warm and dry. No rash noted. No erythema.  Psychiatric: Patient has a normal mood and affect. behavior is normal. Judgment and thought content  normal.  No results found for this or any previous visit (from the past 72 hour(s)).  Assessment & Plan  1. Mild intermittent asthma with acute exacerbation - albuterol (PROVENTIL HFA) 108 (90 Base) MCG/ACT inhaler; Inhale 2 puffs into the lungs every 4 (four) hours as needed.  Dispense: 1 Inhaler; Refill: 0 - budesonide-formoterol (SYMBICORT) 80-4.5 MCG/ACT inhaler; Inhale 2 puffs into the lungs 2 (two) times daily.  Dispense: 1 Inhaler; Refill: 0 - doxycycline (VIBRA-TABS) 100 MG tablet; Take 1 tablet (100 mg total) by mouth 2 (two) times daily for 7 days.  Dispense: 14 tablet; Refill: 0  -Red flags and when to present for emergency care or RTC including fever >101.17F, chest pain, shortness of breath, new/worsening/un-resolving symptoms, reviewed with patient at time of visit. Follow up and care instructions discussed and provided in AVS.

## 2018-02-07 DIAGNOSIS — H25813 Combined forms of age-related cataract, bilateral: Secondary | ICD-10-CM | POA: Diagnosis not present

## 2018-02-07 DIAGNOSIS — D2261 Melanocytic nevi of right upper limb, including shoulder: Secondary | ICD-10-CM | POA: Diagnosis not present

## 2018-02-07 DIAGNOSIS — D2271 Melanocytic nevi of right lower limb, including hip: Secondary | ICD-10-CM | POA: Diagnosis not present

## 2018-02-07 DIAGNOSIS — D2262 Melanocytic nevi of left upper limb, including shoulder: Secondary | ICD-10-CM | POA: Diagnosis not present

## 2018-08-22 IMAGING — MG MM DIGITAL DIAGNOSTIC BILAT W/ TOMO W/ CAD
8 of 16 series · 8 of 36 positions shown · non-contrast
Comparison: Previous exam(s).

CLINICAL DATA: This patient reports non spontaneous yellowish right
nipple discharge, which occurred twice over the last 2 weeks.

EXAM:
2D DIGITAL DIAGNOSTIC BILATERAL MAMMOGRAM WITH CAD AND ADJUNCT TOMO
ULTRASOUND RIGHT BREAST

[R MLO (1 of 2)]
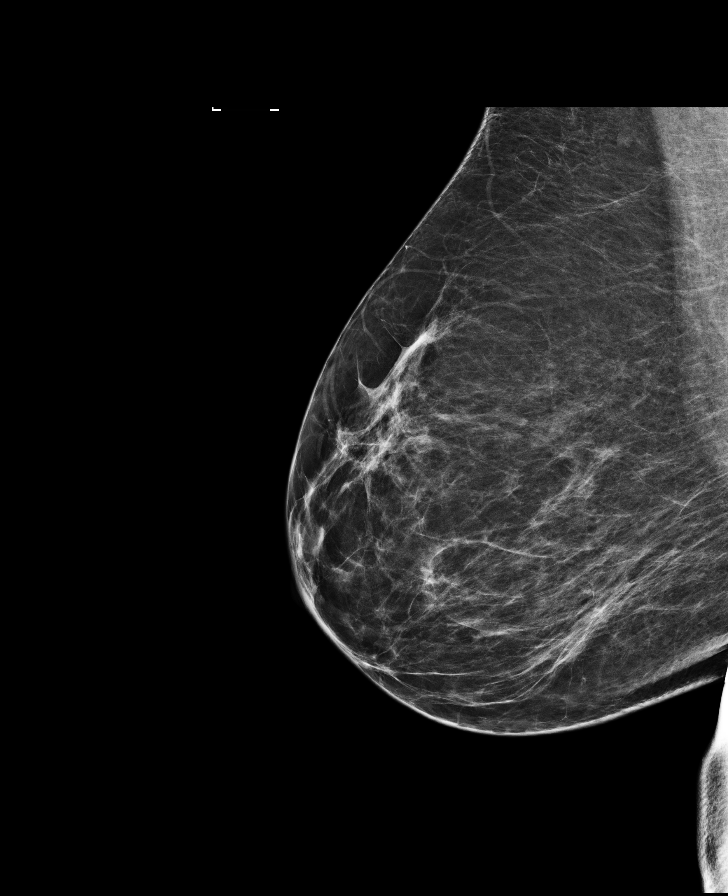

[L CC synth-2D]
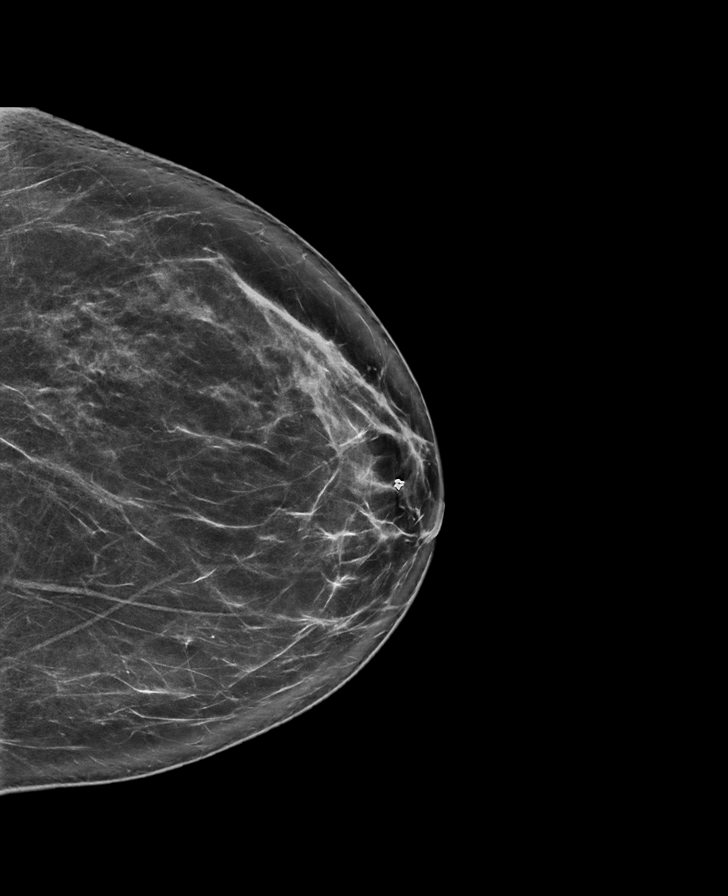

[L CC]
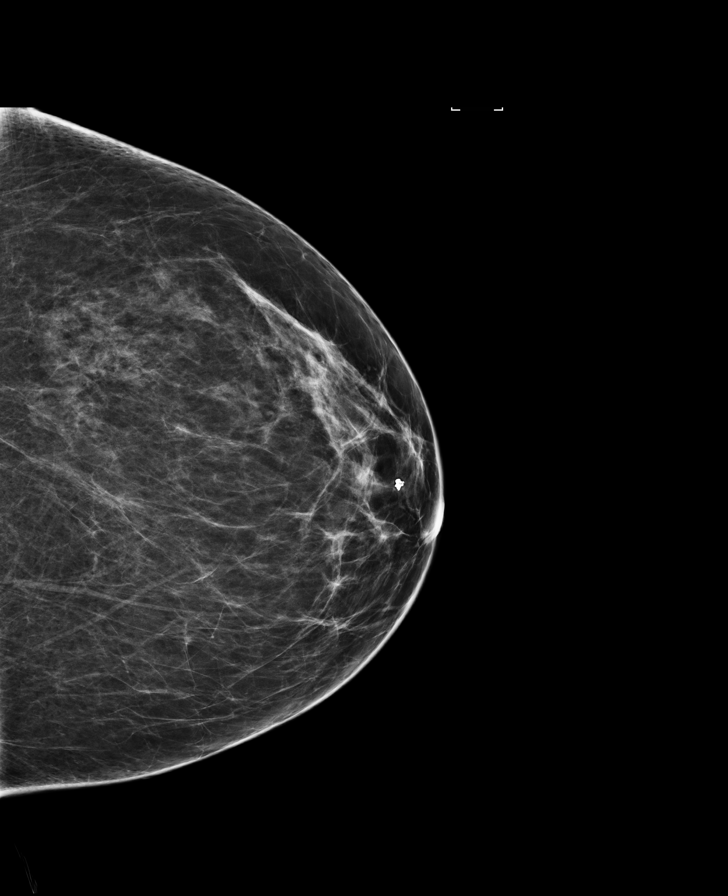

[L MLO]
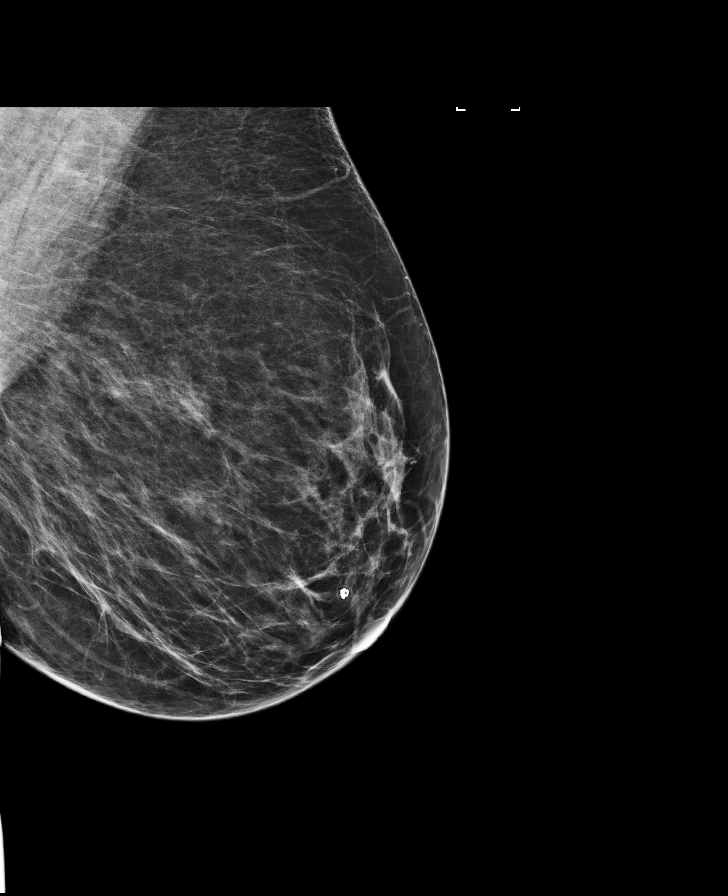

[R MLO synth-2D (1 of 2)]
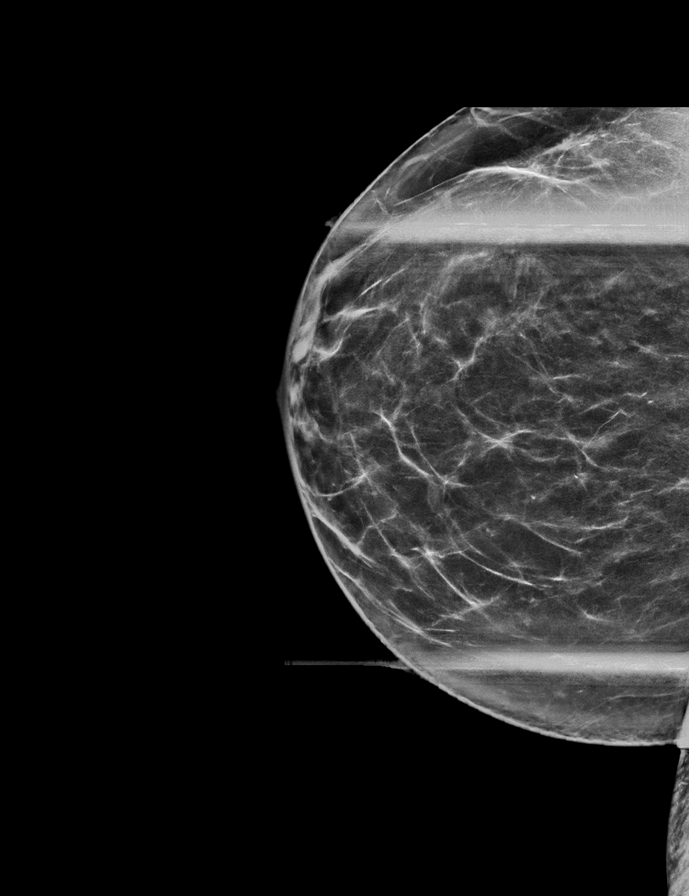

[R MLO synth-2D (2 of 2)]
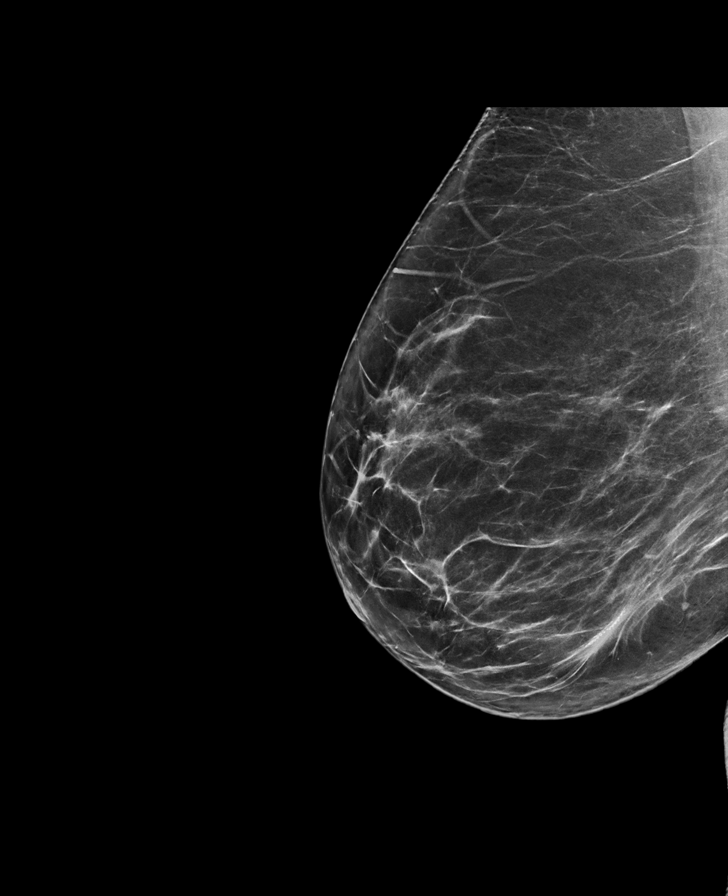

[R MLO (2 of 2)]
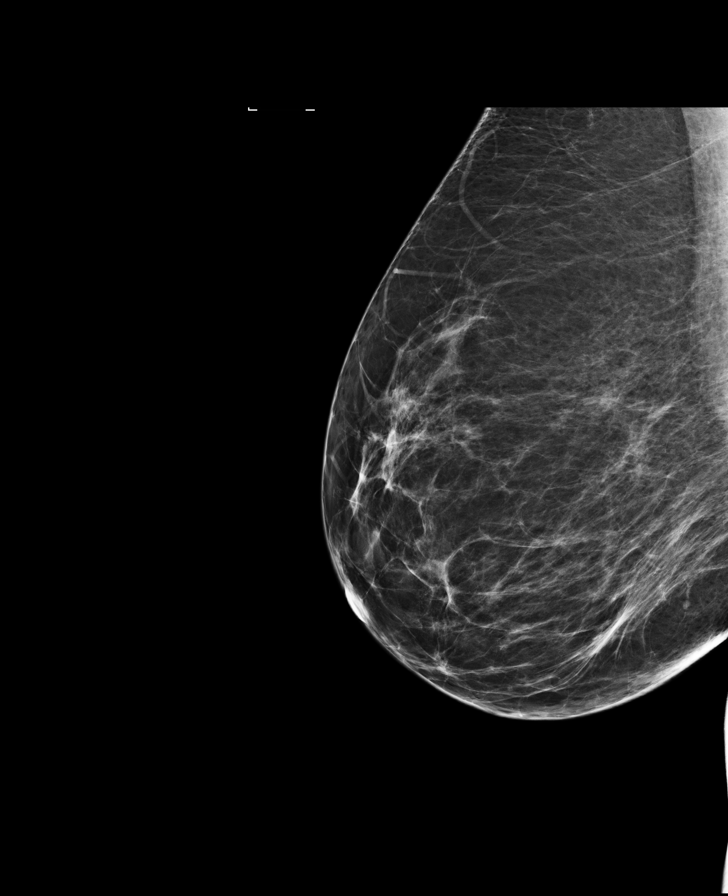

[R CC]
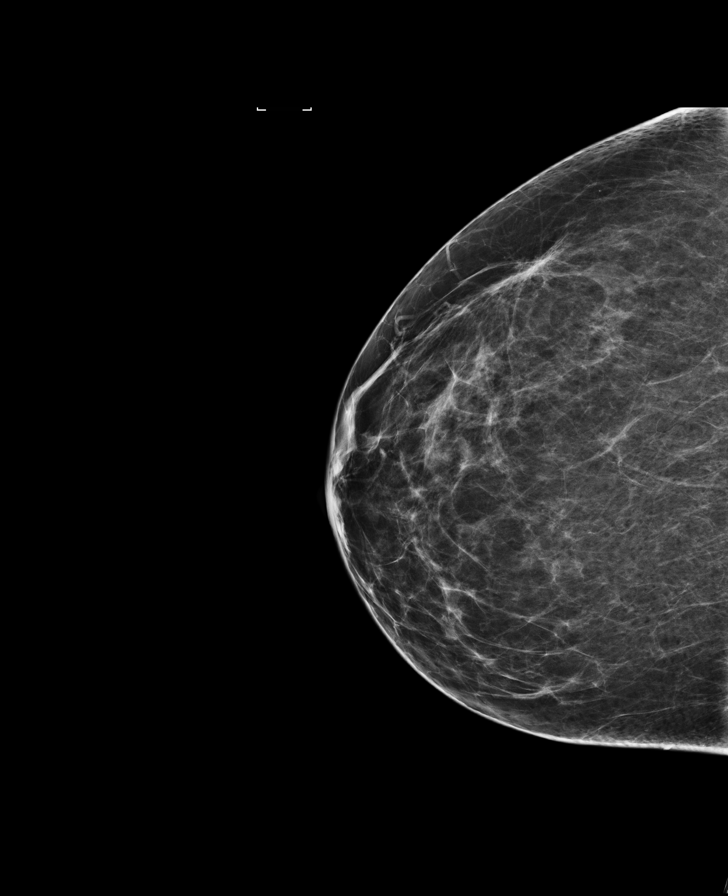

[8 of 36 positions shown; findings below may reference images not displayed]

ACR Breast Density Category b: There are scattered areas of
fibroglandular density.
FINDINGS: There are no discrete masses, areas of architectural distortion,
areas of significant asymmetry or suspicious calcifications. There
has been no mammographic change.

Mammographic images were processed with CAD.

On physical exam, no mass is palpated in the retroareolar right
breast. No discharge was expressed.

Targeted ultrasound is performed, showing normal fibroglandular
tissue in the retroareolar right breast. No mass or cyst. No dilated
ducts.
IMPRESSION: Normal exam.  No evidence of malignancy.

RECOMMENDATION:
Screening mammogram in one year.(Code:P9-3-DC4)

If the nipple discharge recurs and persists, particularly if it is
spontaneous, surgical consultation would be recommended.

I have discussed the findings and recommendations with the patient.
Results were also provided in writing at the conclusion of the
visit. If applicable, a reminder letter will be sent to the patient
regarding the next appointment.

BI-RADS CATEGORY  1: Negative.

## 2018-08-29 ENCOUNTER — Encounter: Payer: 59 | Admitting: Family Medicine

## 2018-12-01 ENCOUNTER — Other Ambulatory Visit: Payer: Self-pay | Admitting: Family Medicine

## 2018-12-01 NOTE — Telephone Encounter (Signed)
Please call to set up routine follow-up

## 2018-12-01 NOTE — Telephone Encounter (Signed)
lvm to sch appt °

## 2018-12-04 IMAGING — CR DG CHEST 2V
2 series · 2 of 2 positions shown · non-contrast
Comparison: None.

CLINICAL DATA: Chest pain.

EXAM:
CHEST  2 VIEW

[chest pa]
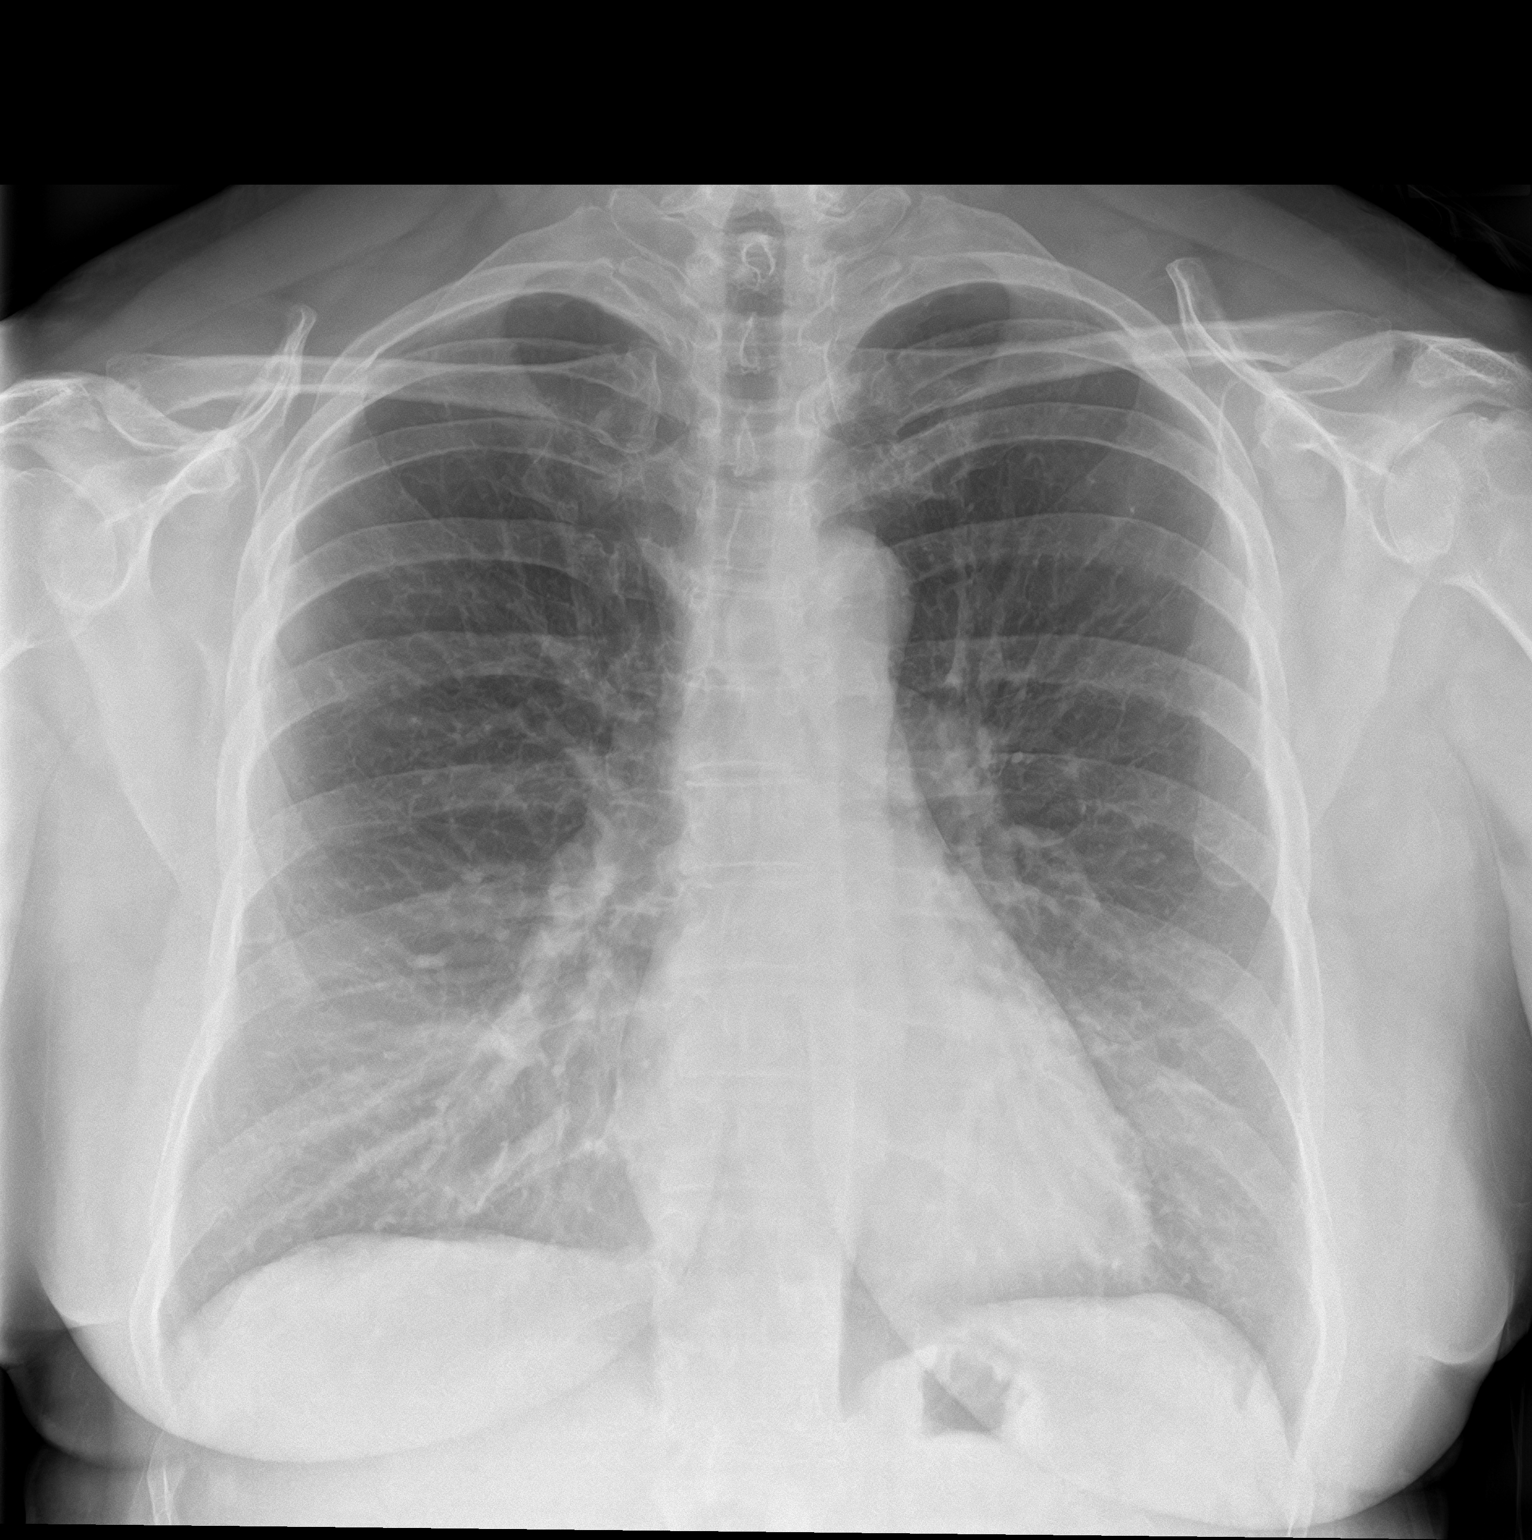

[chest lat]
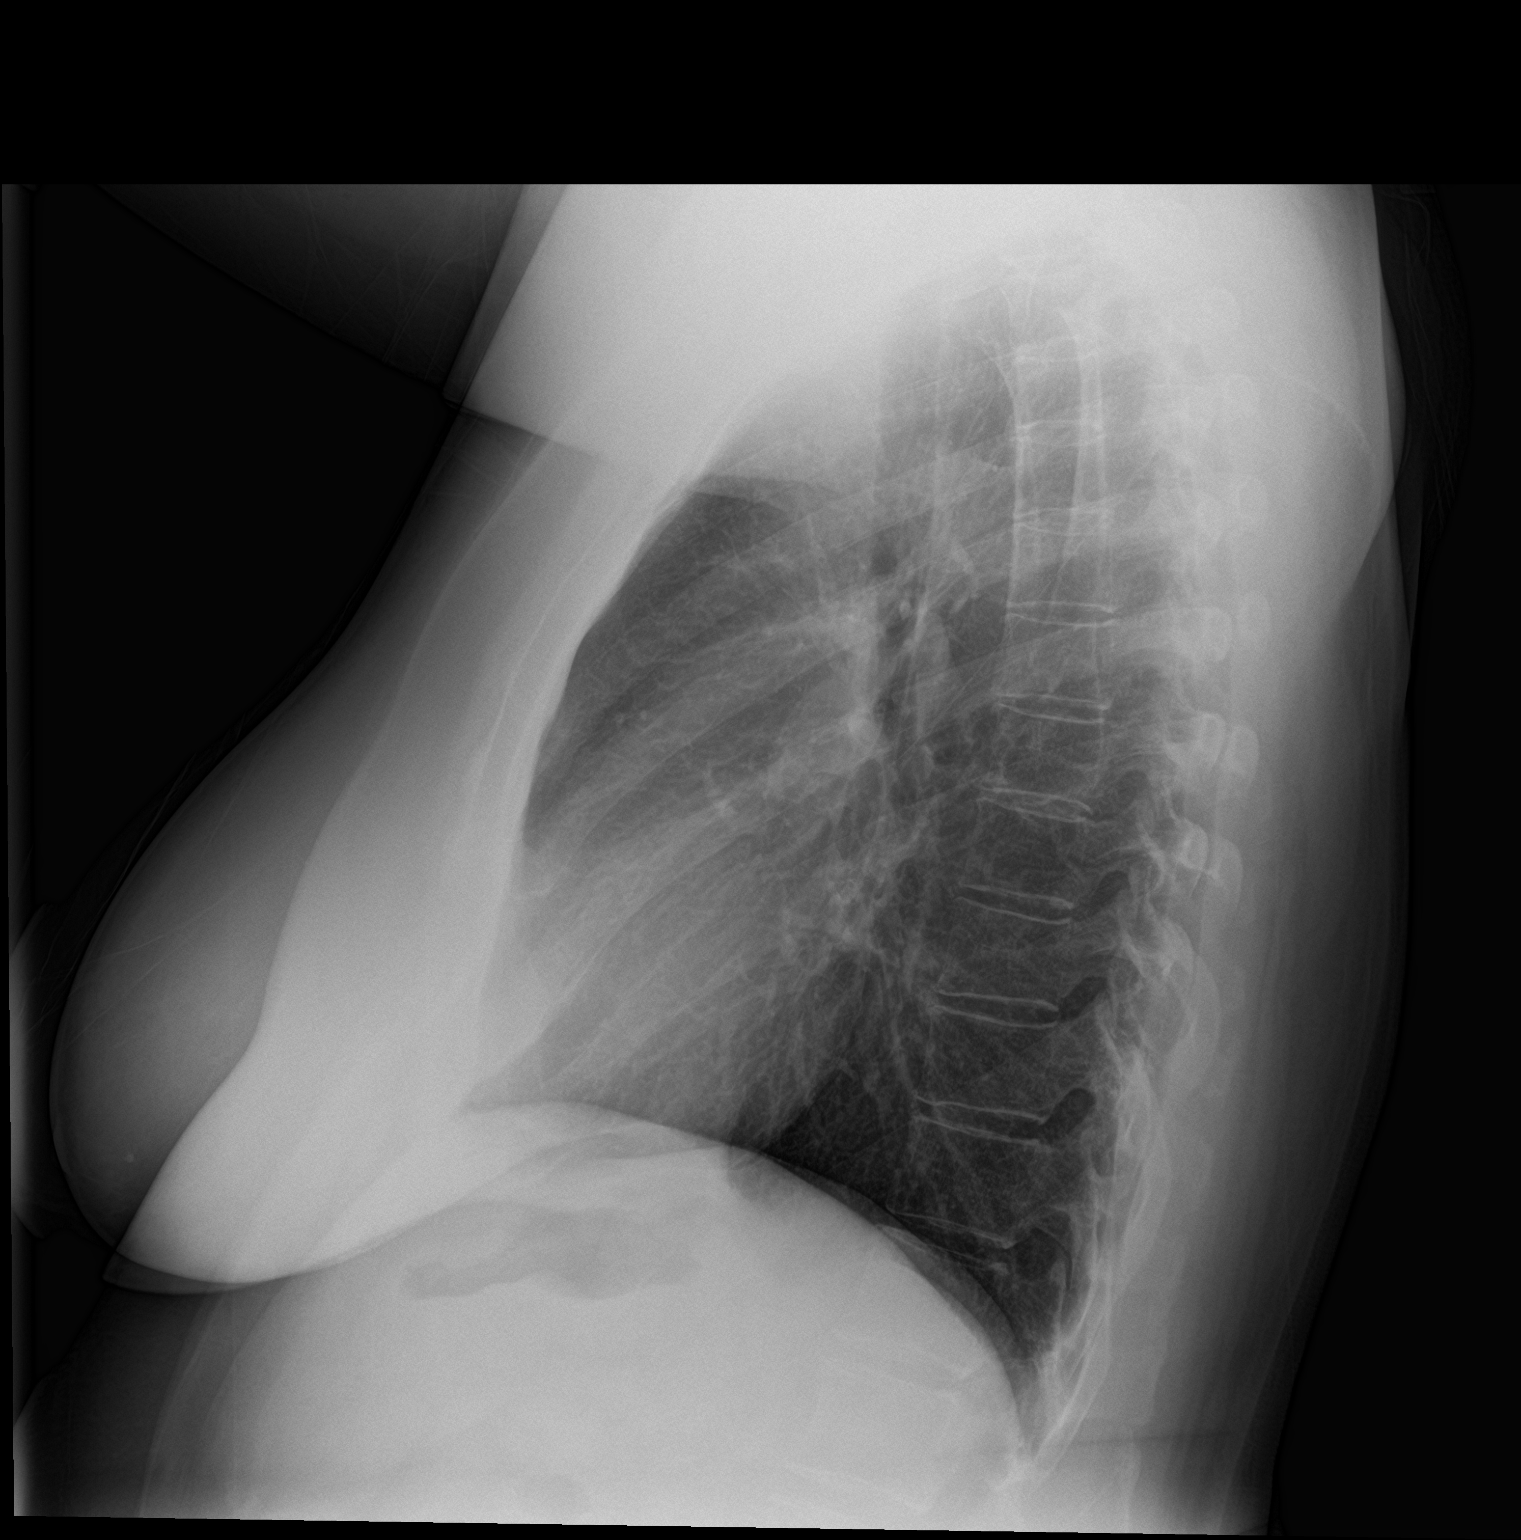

[2 of 2 positions shown; findings below may reference images not displayed]

FINDINGS: The heart size and mediastinal contours are within normal limits.
Mildly prominent interstitial markings at both lung bases. Both
lungs are otherwise clear. The visualized skeletal structures are
unremarkable.
IMPRESSION: No active cardiopulmonary disease.

## 2019-01-09 ENCOUNTER — Telehealth: Payer: Self-pay | Admitting: Family Medicine

## 2019-01-09 NOTE — Telephone Encounter (Signed)
Copied from [place] [id number]. Topic: Appointment Scheduling - Scheduling Inquiry for Clinic >> Jan 07, 2019 12:46 PM Leward Quan A wrote: Reason for CRM: Patient called to say that she have seen Vanessa Harvey in the past and since she was a former patient of Vanessa Harvey who followed Vanessa Harvey out the practice at [facility]. Patient is now without a PCP and would like to become a patient of Vanessa Harvey if she will accept her. Please call with answer at  Ph# [phone]

## 2019-01-09 NOTE — Telephone Encounter (Signed)
OK to book out when we're taking new patients again.

## 2019-01-13 NOTE — Telephone Encounter (Signed)
LVM for pt to call back to schedule appt

## 2019-01-20 NOTE — Telephone Encounter (Signed)
appt scheduled

## 2019-01-21 ENCOUNTER — Ambulatory Visit (INDEPENDENT_AMBULATORY_CARE_PROVIDER_SITE_OTHER): Payer: Self-pay | Admitting: Family Medicine

## 2019-01-21 ENCOUNTER — Encounter: Payer: Self-pay | Admitting: Family Medicine

## 2019-01-21 ENCOUNTER — Other Ambulatory Visit: Payer: Self-pay

## 2019-01-21 VITALS — Temp 98.4°F

## 2019-01-21 DIAGNOSIS — J4521 Mild intermittent asthma with (acute) exacerbation: Secondary | ICD-10-CM

## 2019-01-21 DIAGNOSIS — M545 Low back pain, unspecified: Secondary | ICD-10-CM

## 2019-01-21 DIAGNOSIS — E538 Deficiency of other specified B group vitamins: Secondary | ICD-10-CM

## 2019-01-21 DIAGNOSIS — I10 Essential (primary) hypertension: Secondary | ICD-10-CM

## 2019-01-21 DIAGNOSIS — N182 Chronic kidney disease, stage 2 (mild): Secondary | ICD-10-CM

## 2019-01-21 DIAGNOSIS — Z1239 Encounter for other screening for malignant neoplasm of breast: Secondary | ICD-10-CM

## 2019-01-21 DIAGNOSIS — F3342 Major depressive disorder, recurrent, in full remission: Secondary | ICD-10-CM

## 2019-01-21 MED ORDER — MONTELUKAST SODIUM 10 MG PO TABS: 10.0000 mg | ORAL_TABLET | Freq: Every day | ORAL | 1 refills | Status: DC

## 2019-01-21 MED ORDER — FLUOXETINE HCL 20 MG PO CAPS: 20.0000 mg | ORAL_CAPSULE | Freq: Every day | ORAL | 1 refills | Status: DC

## 2019-01-21 MED ORDER — CYCLOBENZAPRINE HCL 10 MG PO TABS: 10.0000 mg | ORAL_TABLET | Freq: Every day | ORAL | 0 refills | Status: DC

## 2019-01-21 MED ORDER — PREDNISONE 50 MG PO TABS: 50.0000 mg | ORAL_TABLET | Freq: Every day | ORAL | 0 refills | Status: DC

## 2019-01-21 MED ORDER — ALBUTEROL SULFATE HFA 108 (90 BASE) MCG/ACT IN AERS: 2.0000 | INHALATION_SPRAY | RESPIRATORY_TRACT | 6 refills | Status: DC | PRN

## 2019-01-21 NOTE — Progress Notes (Signed)
Temp 98.4 F (36.9 C)   LMP 05/07/2009    Subjective:    Patient ID: Vanessa Harvey, female    DOB: Jan 23, 1961, 58 y.o.   MRN: [id number]  HPI: Vanessa Harvey is a 58 y.o. female who presents today via virtual visit to establish care. She has not seen a PCP in about a year. Previously had been seeing Dr. Sanda Klein  Chief Complaint  Patient presents with  . Establish Care  . Back Pain    Patient slipped getting in the pool    BACK PAIN Duration: 2 weeks Mechanism of injury: slipped getting into the pool and fell  Location: low back, L side Onset: sudden Severity: moderate Quality: was sharp, now a dull ache Frequency: constant Radiation: none Aggravating factors: sit-ups, lifting Alleviating factors: rest, heat, laying, NSAIDs and APAP Status: better Treatments attempted: rest, heat, APAP and ibuprofen  Relief with NSAIDs?: significant Nighttime pain:  no Paresthesias / decreased sensation:  no Bowel / bladder incontinence:  no Fevers:  no Dysuria / urinary frequency:  no  DEPRESSION Mood status: controlled Satisfied with current treatment?: yes Symptom severity: mild  Duration of current treatment : chronic Side effects: no Medication compliance: excellent compliance Psychotherapy/counseling: no  Previous psychiatric medications: prozac Depressed mood: no Anxious mood: no Anhedonia: no Significant weight loss or gain: no Insomnia: no  Fatigue: no Feelings of worthlessness or guilt: no Impaired concentration/indecisiveness: no Suicidal ideations: no Hopelessness: no Crying spells: no Depression screen [facility] 2/9 01/21/2019 12/31/2017 12/31/2017 10/25/2017 10/25/2017  Decreased Interest 0 0 0 0 0  Down, Depressed, Hopeless 0 0 0 0 0  PHQ - 2 Score 0 0 0 0 0  Altered sleeping 0 0 - 0 -  Tired, decreased energy 0 0 - 0 -  Change in appetite 0 0 - 0 -  Feeling bad or failure about yourself  0 0 - 0 -  Trouble concentrating 0 0 - 0 -  Moving slowly or  fidgety/restless 0 0 - 0 -  Suicidal thoughts 0 0 - 0 -  PHQ-9 Score 0 0 - 0 -  Difficult doing work/chores Not difficult at all Not difficult at all - Not difficult at all -   ASTHMA- got sick with bronchitis about 3 weeks ago and treated with z-pack, had to use her inhaler a bit more. Feels like it's gone now, but has continued with a cough since then.  Asthma status: normally very well controlled, but off now Satisfied with current treatment?: no Albuterol/rescue inhaler frequency: only with being sick Dyspnea frequency: rarely  Wheezing frequency: rarely Cough frequency: rarely Nocturnal symptom frequency: none  Limitation of activity: no Current upper respiratory symptoms: no Triggers: illnesses Failed/intolerant to following asthma meds:  Asthma meds in past:  Aerochamber/spacer use: no Visits to ER or Urgent Care in past year: yes   Active Ambulatory Problems    Diagnosis Date Noted  . B12 deficiency 01/24/2010  . PANIC DISORDER 01/08/2007  . Major depression, recurrent, full remission ([facility]) 01/08/2007  . ALLERGIC RHINITIS 01/08/2007  . Asthma, mild intermittent 01/08/2007  . DERMATITIS, OTHER ATOPIC 12/19/2006  . BACK PAIN, LUMBAR 03/29/2010  . GERD (gastroesophageal reflux disease) 09/08/2010  . Obesity 07/06/2015  . Internal hemorrhoid 11/29/2015  . Herpes labialis 08/23/2017  . Essential hypertension, benign 08/23/2017  . RBC microcytosis 10/25/2017  . CKD (chronic kidney disease) stage 2, GFR 60-89 ml/min 10/25/2017   Resolved Ambulatory Problems    Diagnosis Date Noted  . Anxiety  state 12/22/2008  . Dysphagia 09/08/2010  . Viral URI with cough 01/28/2012  . Routine general medical examination at a health care facility 02/21/2012  . Right ear pain 06/29/2013  . Submandibular lymphadenopathy 06/29/2013  . Other malaise and fatigue 06/29/2013  . Elevated blood pressure 07/06/2015  . Numbness in right leg 07/06/2015  . Preventative health care 07/21/2015  .  Visit for screening mammogram 07/21/2015  . Poor concentration 11/29/2015  . Sinusitis, acute frontal 11/29/2015  . Right facial numbness 11/29/2015  . Ear pain 12/20/2015  . Breast discharge 12/12/2016  . Family history of stroke 12/12/2016  . Problems with swallowing and mastication   . Nipple discharge in female 09/26/2017  . Low HDL (under 40) 10/25/2017   Past Medical History:  Diagnosis Date  . Anxiety state, unspecified   . Asthma   . Degenerative disc disease, lumbar   . Depressive disorder, not elsewhere classified   . Other B-complex deficiencies   . Unspecified asthma(493.90)    Past Surgical History:  Procedure Laterality Date  . Abd [place]  09/2005   Negative  . CESAREAN SECTION  1993  . DEXA  06/2004  . ESOPHAGOGASTRODUODENOSCOPY (EGD) WITH PROPOFOL N/A 04/08/2017   Procedure: ESOPHAGOGASTRODUODENOSCOPY (EGD) WITH PROPOFOL;  Surgeon: [name], MD;  Location: [facility];  Service: Endoscopy;  Laterality: N/A;  . KNEE ARTHROSCOPY  1984   evulsion of femur s/p fall   Outpatient Encounter Medications as of 01/21/2019  Medication Sig Note  . albuterol (PROVENTIL HFA) 108 (90 Base) MCG/ACT inhaler Inhale 2 puffs into the lungs every 4 (four) hours as needed.   . Cyanocobalamin (VITAMIN B-12) 1000 MCG SUBL Place 1 tablet (1,000 mcg total) under the tongue daily.   . cyclobenzaprine (FLEXERIL) 10 MG tablet Take 1 tablet (10 mg total) by mouth at bedtime.   Marland Kitchen esomeprazole (NEXIUM) 20 MG capsule Take 1 capsule (20 mg total) by mouth daily as needed.   Marland Kitchen FLUoxetine (PROZAC) 20 MG capsule Take 1 capsule (20 mg total) by mouth daily.   . fluticasone (FLONASE) 50 MCG/ACT nasal spray Place 2 sprays into the nose daily. 04/04/2017: Pt uses as needed  . hydrocortisone (ANUSOL-HC) 25 MG suppository Place 1 suppository (25 mg total) rectally 2 (two) times daily. (Patient taking differently: Place 25 mg rectally 2 (two) times daily as needed. )   . montelukast (SINGULAIR) 10 MG  tablet Take 1 tablet (10 mg total) by mouth daily.   . Multiple Vitamin (MULTIVITAMIN) tablet Take 1 tablet by mouth daily.     . predniSONE (DELTASONE) 50 MG tablet Take 1 tablet (50 mg total) by mouth daily with breakfast.   . [DISCONTINUED] albuterol (PROVENTIL HFA) 108 (90 Base) MCG/ACT inhaler Inhale 2 puffs into the lungs every 4 (four) hours as needed.   . [DISCONTINUED] ALPRAZolam (XANAX) 0.5 MG tablet Take 0.5-1 tablets (0.25-0.5 mg total) by mouth 2 (two) times daily as needed for anxiety.   . [DISCONTINUED] budesonide-formoterol (SYMBICORT) 80-4.5 MCG/ACT inhaler Inhale 2 puffs into the lungs 2 (two) times daily.   . [DISCONTINUED] FLUoxetine (PROZAC) 20 MG capsule TAKE 1 CAPSULE BY MOUTH ONCE DAILY   . [DISCONTINUED] metroNIDAZOLE (METROGEL VAGINAL) 0.75 % vaginal gel Place 1 Applicatorful vaginally 2 (two) times daily. (Patient not taking: Reported on 12/31/2017)   . [DISCONTINUED] montelukast (SINGULAIR) 10 MG tablet TAKE 1 TABLET BY MOUTH ONCE DAILY    No facility-administered encounter medications on file as of 01/21/2019.    Allergies  Allergen Reactions  .  Penicillins     Has patient had a PCN reaction causing immediate rash, facial/tongue/throat swelling, SOB or lightheadedness with hypotension: No Has patient had a PCN reaction causing severe rash involving mucus membranes or skin necrosis: Unknown Has patient had a PCN reaction that required hospitalization: Unknown Has patient had a PCN reaction occurring within the last 10 years: Unknown If all of the above answers are "NO", then may proceed with Cephalosporin use.    Social History   Socioeconomic History  . Marital status: Married    Spouse name: Not on file  . Number of children: 2  . Years of education: Not on file  . Highest education level: Not on file  Occupational History  . Occupation: Art therapist  Social Needs  . Financial resource strain: Not on file  . Food insecurity    Worry: Not on file     Inability: Not on file  . Transportation needs    Medical: Not on file    Non-medical: Not on file  Tobacco Use  . Smoking status: Never Smoker  . Smokeless tobacco: Never Used  Substance and Sexual Activity  . Alcohol use: Yes    Comment: Rare  . Drug use: No  . Sexual activity: Yes    Birth control/protection: Post-menopausal  Lifestyle  . Physical activity    Days per week: Not on file    Minutes per session: Not on file  . Stress: Not on file  Relationships  . Social Herbalist on phone: Not on file    Gets together: Not on file    Attends religious service: Not on file    Active member of club or organization: Not on file    Attends meetings of clubs or organizations: Not on file    Relationship status: Not on file  Other Topics Concern  . Not on file  Social History Narrative   Exercise-yes   Family History  Problem Relation Age of Onset  . Hypertension Father        Mild  . Ulcerative colitis Father   . Cancer Father        esophageal and colon  . Stroke Father   . COPD Father   . Arthritis Mother   . Diabetes Brother   . Diabetes Paternal Grandmother   . Arthritis Brother   . Heart disease Neg Hx   . Breast cancer Neg Hx    Review of Systems  Constitutional: Negative.   HENT: Negative.   Respiratory: Positive for cough. Negative for apnea, choking, chest tightness, shortness of breath, wheezing and stridor.   Cardiovascular: Negative.   Gastrointestinal: Negative.   Musculoskeletal: Positive for back pain and myalgias. Negative for arthralgias, gait problem, joint swelling, neck pain and neck stiffness.  Skin: Negative.   Neurological: Negative.   Psychiatric/Behavioral: Negative.     Per HPI unless specifically indicated above     Objective:    Temp 98.4 F (36.9 C)   LMP 05/07/2009   Wt Readings from Last 3 Encounters:  12/31/17 219 lb 3.2 oz (99.4 kg)  10/25/17 211 lb 8 oz (95.9 kg)  09/26/17 211 lb (95.7 kg)    Physical  Exam Vitals signs and nursing note reviewed.  Constitutional:      General: She is not in acute distress.    Appearance: Normal appearance. She is not ill-appearing, toxic-appearing or diaphoretic.  HENT:     Head: Normocephalic and atraumatic.     Right Ear:  External ear normal.     Left Ear: External ear normal.     Nose: Nose normal.     Mouth/Throat:     Mouth: Mucous membranes are moist.     Pharynx: Oropharynx is clear.  Eyes:     General: No scleral icterus.       Right eye: No discharge.        Left eye: No discharge.     Conjunctiva/sclera: Conjunctivae normal.     Pupils: Pupils are equal, round, and reactive to light.  Neck:     Musculoskeletal: Normal range of motion.  Pulmonary:     Effort: Pulmonary effort is normal. No respiratory distress.     Comments: Speaking in full sentences Musculoskeletal: Normal range of motion.  Skin:    Coloration: Skin is not jaundiced or pale.     Findings: No bruising, erythema, lesion or rash.  Neurological:     Mental Status: She is alert and oriented to person, place, and time. Mental status is at baseline.  Psychiatric:        Mood and Affect: Mood normal.        Behavior: Behavior normal.        Thought Content: Thought content normal.        Judgment: Judgment normal.     Results for orders placed or performed in visit on 10/18/17  TSH  Result Value Ref Range   TSH 1.80 0.40 - 4.50 mIU/L  Lipid panel  Result Value Ref Range   Cholesterol 179 <200 mg/dL   HDL 39 (L) >50 mg/dL   Triglycerides 94 <150 mg/dL   LDL Cholesterol (Calc) 120 (H) mg/dL (calc)   Total CHOL/HDL Ratio 4.6 <5.0 (calc)   Non-HDL Cholesterol (Calc) 140 (H) <130 mg/dL (calc)  COMPLETE METABOLIC PANEL WITH GFR  Result Value Ref Range   Glucose, Bld 98 65 - 99 mg/dL   BUN 17 7 - 25 mg/dL   Creat 0.82 0.50 - 1.05 mg/dL   GFR, Est Non African American 79 > OR = 60 mL/min/1.59m2   GFR, Est African American 91 > OR = 60 mL/min/1.60m2    BUN/Creatinine Ratio NOT APPLICABLE 6 - 22 (calc)   Sodium 139 135 - 146 mmol/L   Potassium 4.5 3.5 - 5.3 mmol/L   Chloride 103 98 - 110 mmol/L   CO2 28 20 - 32 mmol/L   Calcium 9.5 8.6 - 10.4 mg/dL   Total Protein 7.2 6.1 - 8.1 g/dL   Albumin 4.3 3.6 - 5.1 g/dL   Globulin 2.9 1.9 - 3.7 g/dL (calc)   AG Ratio 1.5 1.0 - 2.5 (calc)   Total Bilirubin 0.4 0.2 - 1.2 mg/dL   Alkaline phosphatase (APISO) 85 33 - 130 U/L   AST 22 10 - 35 U/L   ALT 15 6 - 29 U/L  CBC with Differential/Platelet  Result Value Ref Range   WBC 7.8 3.8 - 10.8 Thousand/uL   RBC 4.69 3.80 - 5.10 Million/uL   Hemoglobin 11.9 11.7 - 15.5 g/dL   HCT 36.0 35.0 - 45.0 %   MCV 76.8 (L) 80.0 - 100.0 fL   MCH 25.4 (L) 27.0 - 33.0 pg   MCHC 33.1 32.0 - 36.0 g/dL   RDW 14.4 11.0 - 15.0 %   Platelets 426 (H) 140 - 400 Thousand/uL   MPV 9.0 7.5 - 12.5 fL   Neutro Abs 4,259 1,500 - 7,800 cells/uL   Lymphs Abs 2,449 850 - 3,900 cells/uL  WBC mixed population 663 200 - 950 cells/uL   Eosinophils Absolute 390 15 - 500 cells/uL   Basophils Absolute 39 0 - 200 cells/uL   Neutrophils Relative % 54.6 %   Total Lymphocyte 31.4 %   Monocytes Relative 8.5 %   Eosinophils Relative 5.0 %   Basophils Relative 0.5 %      Assessment & Plan:   Problem List Items Addressed This Visit      Cardiovascular and Mediastinum   Essential hypertension, benign - Primary    No BP today- will get her in for labs and vitals soon. Treat as needed.         Respiratory   Asthma, mild intermittent    Usually well controlled, but acting up after bronchitis, will treat with burst of prednisone and continue singulair and albuterol. Refills given today.       Relevant Medications   albuterol (PROVENTIL HFA) 108 (90 Base) MCG/ACT inhaler   montelukast (SINGULAIR) 10 MG tablet   predniSONE (DELTASONE) 50 MG tablet     Genitourinary   CKD (chronic kidney disease) stage 2, GFR 60-89 ml/min    Due for recheck on her labs, but no insurance  right now. Will check in 6 months at physical.        Other   B12 deficiency    Due for recheck on her labs, but no insurance right now. Will check in 6 months at physical.      Major depression, recurrent, full remission ([place])    Under good control on current regimen. Continue current regimen. Continue to monitor. Call with any concerns. Refills given.        Relevant Medications   FLUoxetine (PROZAC) 20 MG capsule   BACK PAIN, LUMBAR    Will treat with flexeril and exercises. Call if not getting better or getting worse.       Relevant Medications   cyclobenzaprine (FLEXERIL) 10 MG tablet   predniSONE (DELTASONE) 50 MG tablet    Other Visit Diagnoses    Screening for breast cancer       No insurance right now- will give number for uninsured mammograms.    Relevant Orders   MM DIGITAL SCREENING BILATERAL       Follow up plan: Return 6 months Physical.

## 2019-01-21 NOTE — Assessment & Plan Note (Signed)
Usually well controlled, but acting up after bronchitis, will treat with burst of prednisone and continue singulair and albuterol. Refills given today.

## 2019-01-21 NOTE — Assessment & Plan Note (Signed)
Will treat with flexeril and exercises. Call if not getting better or getting worse.

## 2019-01-21 NOTE — Assessment & Plan Note (Signed)
No BP today- will get her in for labs and vitals soon. Treat as needed.

## 2019-01-21 NOTE — Assessment & Plan Note (Signed)
Due for recheck on her labs, but no insurance right now. Will check in 6 months at physical.

## 2019-01-21 NOTE — Patient Instructions (Addendum)
[facility]: [phone]   Back Exercises The following exercises strengthen the muscles that help to support the trunk and back. They also help to keep the lower back flexible. Doing these exercises can help to prevent back pain or lessen existing pain.  If you have back pain or discomfort, try doing these exercises 2-3 times each day or as told by your health care provider.  As your pain improves, do them once each day, but increase the number of times that you repeat the steps for each exercise (do more repetitions).  To prevent the recurrence of back pain, continue to do these exercises once each day or as told by your health care provider. Do exercises exactly as told by your health care provider and adjust them as directed. It is normal to feel mild stretching, pulling, tightness, or discomfort as you do these exercises, but you should stop right away if you feel sudden pain or your pain gets worse. Exercises Single knee to chest Repeat these steps 3-5 times for each leg: 1. Lie on your back on a firm bed or the floor with your legs extended. 2. Bring one knee to your chest. Your other leg should stay extended and in contact with the floor. 3. Hold your knee in place by grabbing your knee or thigh with both hands and hold. 4. Pull on your knee until you feel a gentle stretch in your lower back or buttocks. 5. Hold the stretch for 10-30 seconds. 6. Slowly release and straighten your leg. Pelvic tilt Repeat these steps 5-10 times: 1. Lie on your back on a firm bed or the floor with your legs extended. 2. Bend your knees so they are pointing toward the ceiling and your feet are flat on the floor. 3. Tighten your lower abdominal muscles to press your lower back against the floor. This motion will tilt your pelvis so your tailbone points up toward the ceiling instead of pointing to your feet or the floor. 4. With gentle tension and even breathing, hold this  position for 5-10 seconds. Cat-cow Repeat these steps until your lower back becomes more flexible: 1. Get into a hands-and-knees position on a firm surface. Keep your hands under your shoulders, and keep your knees under your hips. You may place padding under your knees for comfort. 2. Let your head hang down toward your chest. Contract your abdominal muscles and point your tailbone toward the floor so your lower back becomes rounded like the back of a cat. 3. Hold this position for 5 seconds. 4. Slowly lift your head, let your abdominal muscles relax and point your tailbone up toward the ceiling so your back forms a sagging arch like the back of a cow. 5. Hold this position for 5 seconds.  Press-ups Repeat these steps 5-10 times: 1. Lie on your abdomen (face-down) on the floor. 2. Place your palms near your head, about shoulder-width apart. 3. Keeping your back as relaxed as possible and keeping your hips on the floor, slowly straighten your arms to raise the top half of your body and lift your shoulders. Do not use your back muscles to raise your upper torso. You may adjust the placement of your hands to make yourself more comfortable. 4. Hold this position for 5 seconds while you keep your back relaxed. 5. Slowly return to lying flat on the floor.  Bridges Repeat these steps 10 times: 1. Lie on your back on a firm surface. 2. Bend your knees so  they are pointing toward the ceiling and your feet are flat on the floor. Your arms should be flat at your sides, next to your body. 3. Tighten your buttocks muscles and lift your buttocks off the floor until your waist is at almost the same height as your knees. You should feel the muscles working in your buttocks and the back of your thighs. If you do not feel these muscles, slide your feet 1-2 inches farther away from your buttocks. 4. Hold this position for 3-5 seconds. 5. Slowly lower your hips to the starting position, and allow your buttocks  muscles to relax completely. If this exercise is too easy, try doing it with your arms crossed over your chest. Abdominal crunches Repeat these steps 5-10 times: 1. Lie on your back on a firm bed or the floor with your legs extended. 2. Bend your knees so they are pointing toward the ceiling and your feet are flat on the floor. 3. Cross your arms over your chest. 4. Tip your chin slightly toward your chest without bending your neck. 5. Tighten your abdominal muscles and slowly raise your trunk (torso) high enough to lift your shoulder blades a tiny bit off the floor. Avoid raising your torso higher than that because it can put too much stress on your low back and does not help to strengthen your abdominal muscles. 6. Slowly return to your starting position. Back lifts Repeat these steps 5-10 times: 1. Lie on your abdomen (face-down) with your arms at your sides, and rest your forehead on the floor. 2. Tighten the muscles in your legs and your buttocks. 3. Slowly lift your chest off the floor while you keep your hips pressed to the floor. Keep the back of your head in line with the curve in your back. Your eyes should be looking at the floor. 4. Hold this position for 3-5 seconds. 5. Slowly return to your starting position. Contact a health care provider if:  Your back pain or discomfort gets much worse when you do an exercise.  Your worsening back pain or discomfort does not lessen within 2 hours after you exercise. If you have any of these problems, stop doing these exercises right away. Do not do them again unless your health care provider says that you can. Get help right away if:  You develop sudden, severe back pain. If this happens, stop doing the exercises right away. Do not do them again unless your health care provider says that you can. This information is not intended to replace advice given to you by your health care provider. Make sure you discuss any questions you have with  your health care provider. Document Released: 05/31/2004 Document Revised: 08/28/2018 Document Reviewed: 01/23/2018 Elsevier Patient Education  2020 [employer].  Thoracic Strain Rehab Ask your health care provider which exercises are safe for you. Do exercises exactly as told by your health care provider and adjust them as directed. It is normal to feel mild stretching, pulling, tightness, or discomfort as you do these exercises. Stop right away if you feel sudden pain or your pain gets worse. Do not begin these exercises until told by your health care provider. Stretching and range-of-motion exercise This exercise warms up your muscles and joints and improves the movement and flexibility of your back and shoulders. This exercise also helps to relieve pain. Chest and spine stretch  1. Lie down on your back on a firm surface. 2. Roll a towel or a small blanket so  it is about 4 inches (10 cm) in diameter. 3. Put the towel lengthwise under the middle of your back so it is under your spine, but not under your shoulder blades. 4. Put your hands behind your head and let your elbows fall to your sides. This will increase your stretch. 5. Take a deep breath (inhale). 6. Hold for __________ seconds. 7. Relax after you breathe out (exhale). Repeat __________ times. Complete this exercise __________ times a day. Strengthening exercises These exercises build strength and endurance in your back and your shoulder blade muscles. Endurance is the ability to use your muscles for a long time, even after they get tired. Alternating arm and leg raises  1. Get on your hands and knees on a firm surface. If you are on a hard floor, you may want to use padding, such as an exercise mat, to cushion your knees. 2. Line up your arms and legs. Your hands should be directly below your shoulders, and your knees should be directly below your hips. 3. Lift your left leg behind you. At the same time, raise your right arm  and straighten it in front of you. ? Do not lift your leg higher than your hip. ? Do not lift your arm higher than your shoulder. ? Keep your abdominal and back muscles tight. ? Keep your hips facing the ground. ? Do not arch your back. ? Keep your balance carefully, and do not hold your breath. 4. Hold for __________ seconds. 5. Slowly return to the starting position and repeat with your right leg and your left arm. Repeat __________ times. Complete this exercise __________ times a day. Straight arm rows This exercise is also called shoulder extension exercise. 1. Stand with your feet shoulder width apart. 2. Secure an exercise band to a stable object in front of you so the band is at or above shoulder height. 3. Hold one end of the exercise band in each hand. 4. Straighten your elbows and lift your hands up to shoulder height. 5. Step back, away from the secured end of the exercise band, until the band stretches. 6. Squeeze your shoulder blades together and pull your hands down to the sides of your thighs. Stop when your hands are straight down by your sides. This is shoulder extension. Do not let your hands go behind your body. 7. Hold for __________ seconds. 8. Slowly return to the starting position. Repeat __________ times. Complete this exercise __________ times a day. Prone shoulder external rotation 1. Lie on your abdomen on a firm bed so your left / right forearm hangs over the edge of the bed and your upper arm is on the bed, straight out from your body. This is the prone position. ? Your elbow should be bent. ? Your palm should be facing your feet. 2. If instructed, hold a __________ weight in your hand. 3. Squeeze your shoulder blade toward the middle of your back. Do not let your shoulder lift toward your ear. 4. Keep your elbow bent in a 90-degree angle (right angle) while you slowly move your forearm up toward the ceiling. Move your forearm up to the height of the bed,  toward your head. This is external rotation. ? Your upper arm should not move. ? At the top of the movement, your palm should face the floor. 5. Hold for __________ seconds. 6. Slowly return to the starting position and relax your muscles. Repeat __________ times. Complete this exercise __________ times a day. Rowing scapular  retraction This is an exercise in which the shoulder blades (scapulae) are pulled toward each other (retraction). 1. Sit in a stable chair without armrests, or stand up. 2. Secure an exercise band to a stable object in front of you so the band is at shoulder height. 3. Hold one end of the exercise band in each hand. Your palms should face down. 4. Bring your arms out straight in front of you. 5. Step back, away from the secured end of the exercise band, until the band stretches. 6. Pull the band backward. As you do this, bend your elbows and squeeze your shoulder blades together, but avoid letting the rest of your body move. Do not shrug your shoulders upward while you do this. 7. Stop when your elbows are at your sides or slightly behind your body. 8. Hold for __________ seconds. 9. Slowly straighten your arms to return to the starting position. Repeat __________ times. Complete this exercise __________ times a day. Posture and body mechanics Good posture and healthy body mechanics can help to relieve stress in your body's tissues and joints. Body mechanics refers to the movements and positions of your body while you do your daily activities. Posture is part of body mechanics. Good posture means:  Your spine is in its natural S-curve position (neutral).  Your shoulders are pulled back slightly.  Your head is not tipped forward. Follow these guidelines to improve your posture and body mechanics in your everyday activities. Standing   When standing, keep your spine neutral and your feet about hip width apart. Keep a slight bend in your knees. Your ears, shoulders,  and hips should line up with each other.  When you do a task in which you lean forward while standing in one place for a long time, place one foot up on a stable object that is 2-4 inches (5-10 cm) high, such as a footstool. This helps keep your spine neutral. Sitting   When sitting, keep your spine neutral and keep your feet flat on the floor. Use a footrest, if necessary, and keep your thighs parallel to the floor. Avoid rounding your shoulders, and avoid tilting your head forward.  When working at a desk or a computer, keep your desk at a height where your hands are slightly lower than your elbows. Slide your chair under your desk so you are close enough to maintain good posture.  When working at a computer, place your monitor at a height where you are looking straight ahead and you do not have to tilt your head forward or downward to look at the screen. Resting When lying down and resting, avoid positions that are most painful for you.  If you have pain with activities such as sitting, bending, stooping, or squatting (flexion-basedactivities), lie in a position in which your body does not bend very much. For example, avoid curling up on your side with your arms and knees near your chest (fetal position).  If you have pain with activities such as standing for a long time or reaching with your arms (extension-basedactivities), lie with your spine in a neutral position and bend your knees slightly. Try the following positions: ? Lie on your side with a pillow between your knees. ? Lie on your back with a pillow under your knees.  Lifting   When lifting objects, keep your feet at least shoulder width apart and tighten your abdominal muscles.  Bend your knees and hips and keep your spine neutral. It is important to  lift using the strength of your legs, not your back. Do not lock your knees straight out.  Always ask for help to lift heavy or awkward objects. This information is not intended  to replace advice given to you by your health care provider. Make sure you discuss any questions you have with your health care provider. Document Released: 04/23/2005 Document Revised: 08/15/2018 Document Reviewed: 06/02/2018 Elsevier Patient Education  2020 [employer].

## 2019-01-21 NOTE — Assessment & Plan Note (Signed)
Under good control on current regimen. Continue current regimen. Continue to monitor. Call with any concerns. Refills given.   

## 2019-02-17 ENCOUNTER — Encounter: Payer: Self-pay | Admitting: Family Medicine

## 2019-02-17 ENCOUNTER — Other Ambulatory Visit: Payer: Self-pay

## 2019-02-17 ENCOUNTER — Ambulatory Visit: Payer: Self-pay | Admitting: Family Medicine

## 2019-02-17 ENCOUNTER — Ambulatory Visit (INDEPENDENT_AMBULATORY_CARE_PROVIDER_SITE_OTHER): Payer: Self-pay | Admitting: Family Medicine

## 2019-02-17 VITALS — BP 144/82 | HR 85 | Temp 98.3°F

## 2019-02-17 DIAGNOSIS — I10 Essential (primary) hypertension: Secondary | ICD-10-CM

## 2019-02-17 DIAGNOSIS — M7050 Other bursitis of knee, unspecified knee: Secondary | ICD-10-CM

## 2019-02-17 MED ORDER — PREDNISONE 10 MG PO TABS: ORAL_TABLET | ORAL | 0 refills | Status: DC

## 2019-02-17 NOTE — Assessment & Plan Note (Signed)
Running slightly high. Will work on [employer] and recheck 1 month. If still running high, will start medicine.

## 2019-02-17 NOTE — Progress Notes (Signed)
BP (!) 144/82   Pulse 85   Temp 98.3 F (36.8 C)   LMP 05/07/2009   SpO2 99%    Subjective:    Patient ID: Vanessa Harvey, female    DOB: 01/22/1961, 58 y.o.   MRN: [id number]  HPI: Vanessa Harvey is a 58 y.o. female  Chief Complaint  Patient presents with  . Knee Pain    right knee pain, no know injury   KNEE PAIN Duration: 3 weeks Involved knee: right Mechanism of injury: unknown Location:anterior and medial Onset: gradual Severity: 4/10  Quality:  Aching and sore Frequency: intermittent- gets worse as the day goes on Radiation: no Aggravating factors: weight bearing, walking, running, stairs and bending  Alleviating factors: NSAIDs and brace  Status: worse Treatments attempted: rest, ice and ibuprofen  Relief with NSAIDs?:  mild Weakness with weight bearing or walking: no Sensation of giving way: no Locking: no Popping: yes Bruising: no Swelling: yes Redness: no Paresthesias/decreased sensation: no Fevers: no  Relevant past medical, surgical, family and social history reviewed and updated as indicated. Interim medical history since our last visit reviewed. Allergies and medications reviewed and updated.  Review of Systems  Constitutional: Negative.   Respiratory: Negative.   Cardiovascular: Negative.   Musculoskeletal: Positive for arthralgias, gait problem, joint swelling and myalgias. Negative for back pain, neck pain and neck stiffness.  Skin: Negative.   Psychiatric/Behavioral: Negative.     Per HPI unless specifically indicated above     Objective:    BP (!) 144/82   Pulse 85   Temp 98.3 F (36.8 C)   LMP 05/07/2009   SpO2 99%   Wt Readings from Last 3 Encounters:  12/31/17 219 lb 3.2 oz (99.4 kg)  10/25/17 211 lb 8 oz (95.9 kg)  09/26/17 211 lb (95.7 kg)    Physical Exam Vitals signs and nursing note reviewed.  Constitutional:      General: She is not in acute distress.    Appearance: Normal appearance. She is not  ill-appearing, toxic-appearing or diaphoretic.  HENT:     Head: Normocephalic and atraumatic.     Right Ear: External ear normal.     Left Ear: External ear normal.     Nose: Nose normal.     Mouth/Throat:     Mouth: Mucous membranes are moist.     Pharynx: Oropharynx is clear.  Eyes:     General: No scleral icterus.       Right eye: No discharge.        Left eye: No discharge.     Extraocular Movements: Extraocular movements intact.     Conjunctiva/sclera: Conjunctivae normal.     Pupils: Pupils are equal, round, and reactive to light.  Neck:     Musculoskeletal: Normal range of motion and neck supple.  Cardiovascular:     Rate and Rhythm: Normal rate and regular rhythm.     Pulses: Normal pulses.     Heart sounds: Normal heart sounds. No murmur. No friction rub. No gallop.   Pulmonary:     Effort: Pulmonary effort is normal. No respiratory distress.     Breath sounds: Normal breath sounds. No stridor. No wheezing, rhonchi or rales.  Chest:     Chest wall: No tenderness.  Musculoskeletal: Normal range of motion.        General: Tenderness (R pes anserine bursa, no tenderness over joint line) present. No swelling, deformity or signs of injury.     Right lower leg:  No edema.     Left lower leg: No edema.  Skin:    General: Skin is warm and dry.     Capillary Refill: Capillary refill takes less than 2 seconds.     Coloration: Skin is not jaundiced or pale.     Findings: No bruising, erythema, lesion or rash.  Neurological:     General: No focal deficit present.     Mental Status: She is alert and oriented to person, place, and time. Mental status is at baseline.  Psychiatric:        Mood and Affect: Mood normal.        Behavior: Behavior normal.        Thought Content: Thought content normal.        Judgment: Judgment normal.     Results for orders placed or performed in visit on 10/18/17  TSH  Result Value Ref Range   TSH 1.80 0.40 - 4.50 mIU/L  Lipid panel  Result  Value Ref Range   Cholesterol 179 <200 mg/dL   HDL 39 (L) >50 mg/dL   Triglycerides 94 <150 mg/dL   LDL Cholesterol (Calc) 120 (H) mg/dL (calc)   Total CHOL/HDL Ratio 4.6 <5.0 (calc)   Non-HDL Cholesterol (Calc) 140 (H) <130 mg/dL (calc)  COMPLETE METABOLIC PANEL WITH GFR  Result Value Ref Range   Glucose, Bld 98 65 - 99 mg/dL   BUN 17 7 - 25 mg/dL   Creat 0.82 0.50 - 1.05 mg/dL   GFR, Est Non African American 79 > OR = 60 mL/min/1.20m2   GFR, Est African American 91 > OR = 60 mL/min/1.67m2   BUN/Creatinine Ratio NOT APPLICABLE 6 - 22 (calc)   Sodium 139 135 - 146 mmol/L   Potassium 4.5 3.5 - 5.3 mmol/L   Chloride 103 98 - 110 mmol/L   CO2 28 20 - 32 mmol/L   Calcium 9.5 8.6 - 10.4 mg/dL   Total Protein 7.2 6.1 - 8.1 g/dL   Albumin 4.3 3.6 - 5.1 g/dL   Globulin 2.9 1.9 - 3.7 g/dL (calc)   AG Ratio 1.5 1.0 - 2.5 (calc)   Total Bilirubin 0.4 0.2 - 1.2 mg/dL   Alkaline phosphatase (APISO) 85 33 - 130 U/L   AST 22 10 - 35 U/L   ALT 15 6 - 29 U/L  CBC with Differential/Platelet  Result Value Ref Range   WBC 7.8 3.8 - 10.8 Thousand/uL   RBC 4.69 3.80 - 5.10 Million/uL   Hemoglobin 11.9 11.7 - 15.5 g/dL   HCT 36.0 35.0 - 45.0 %   MCV 76.8 (L) 80.0 - 100.0 fL   MCH 25.4 (L) 27.0 - 33.0 pg   MCHC 33.1 32.0 - 36.0 g/dL   RDW 14.4 11.0 - 15.0 %   Platelets 426 (H) 140 - 400 Thousand/uL   MPV 9.0 7.5 - 12.5 fL   Neutro Abs 4,259 1,500 - 7,800 cells/uL   Lymphs Abs 2,449 850 - 3,900 cells/uL   WBC mixed population 663 200 - 950 cells/uL   Eosinophils Absolute 390 15 - 500 cells/uL   Basophils Absolute 39 0 - 200 cells/uL   Neutrophils Relative % 54.6 %   Total Lymphocyte 31.4 %   Monocytes Relative 8.5 %   Eosinophils Relative 5.0 %   Basophils Relative 0.5 %      Assessment & Plan:   Problem List Items Addressed This Visit      Cardiovascular and Mediastinum   Essential hypertension, benign  Running slightly high. Will work on [employer] and recheck 1 month. If still  running high, will start medicine.        Other Visit Diagnoses    Pes anserine bursitis    -  Primary   Will treat with prednisone and stretches. If not getting better will get her into ortho for shot.    Relevant Medications   predniSONE (DELTASONE) 10 MG tablet       Follow up plan: Return in about 4 weeks (around 03/17/2019).

## 2019-02-17 NOTE — Patient Instructions (Addendum)
Pes Anserine Bursitis  The pes anserine is an area on the inside of your knee, just below the joint, that is cushioned by a fluid-filled sac (bursa). Pes anserine bursitis is a condition that happens when the bursa gets swollen and irritated. The condition causes knee pain. What are the causes? This condition may be caused by:  Making the same movement over and over.  A direct hit (trauma) to the inside of the leg. What increases the risk? You are more likely to develop this condition if you:  Are a runner.  Play sports that involve a lot of running and quick side-to-side movements (cutting).  Are an athlete who plays contact sports.  Swim using an inward angle of the knee, such as with the breaststroke.  Have tight hamstring muscles.  Are a woman.  Are overweight.  Have flat feet.  Have diabetes or osteoarthritis. What are the signs or symptoms? Symptoms of this condition include:  Knee pain that gets better with rest and worse with activities like climbing stairs, walking, running, or getting in and out of a chair.  Swelling.  Warmth.  Tenderness when pressing at the inside of the lower leg, just below the knee. How is this diagnosed? This condition may be diagnosed based on:  Your symptoms.  Your medical history.  A physical exam. ? During your physical exam, your health care provider will press on the tendon attachment to see if you feel pain. ? Your health care provider may also check your hip and knee motion and strength.  Tests to check for swelling and fluid buildup in the bursa and to look at muscles, bones, and tendons. These tests might include: ? X-rays. ? MRI. ? Ultrasound. How is this treated? This condition may be treated by:  Resting your knee. You may be told to raise (elevate) your knee while resting.  Avoiding activities that cause pain.  Icing the inside of your knee.  Sleeping with a pillow between your knees. This will cushion your  injured knee.  Taking medicine by mouth (orally) to reduce pain and swelling or having medicine injected into your knee.  Doing strengthening and stretching exercises (physical therapy). If these treatments do not work or if the condition keeps coming back, you may need to have surgery to remove the bursa. Follow these instructions at home: Managing pain, stiffness, and swelling   If directed, put ice on the injured area. ? Put ice in a plastic bag. ? Place a towel between your skin and the bag. ? Leave the ice on for 20 minutes, 2-3 times a day.  Elevate the injured area above the level of your heart while you are sitting or lying down. Activity  Return to your normal activities as told by your health care provider. Ask your health care provider what activities are safe for you.  Do exercises as told by your health care provider. General instructions  Take over-the-counter and prescription medicines only as told by your health care provider.  Sleep with a pillow between your knees.  Do not use any products that contain nicotine or tobacco, such as cigarettes, e-cigarettes, and chewing tobacco. These can delay healing. If you need help quitting, ask your health care provider.  If you are overweight, work with your health care provider and a dietitian to set a weight-loss goal that is healthy and reasonable for you.  Keep all follow-up visits as told by your health care provider. This is important. How is this prevented?  When exercising, make sure that you: ? Warm up and stretch before being active. ? Cool down and stretch after being active. ? Give your body time to rest between periods of activity. ? Use equipment that fits you. ? Are safe and responsible while being active to avoid falls. ? Do at least 150 minutes of moderate-intensity exercise each week, such as brisk walking or water aerobics. ? Maintain physical fitness,  including:  Strength.  Flexibility.  Cardiovascular fitness.  Endurance. ? Maintain a healthy weight. Contact a health care provider if:  Your symptoms do not improve.  Your symptoms get worse. Summary  Pes anserine bursitis is a condition that happens when the fluid-filled sac (bursa) at the inside of your knee gets swollen and irritated. The condition causes knee pain.  Treatment for pes anserine bursitis may include resting your knee, icing the inside of your knee, sleeping with a pillow between your knees, taking medicine by mouth or by injection, and doing strengthening and stretching exercises (physical therapy).  Follow instructions for managing pain, stiffness, and swelling.  Take over-the-counter and prescription medicines only as told by your health care provider. This information is not intended to replace advice given to you by your health care provider. Make sure you discuss any questions you have with your health care provider. Document Released: 04/23/2005 Document Revised: 08/14/2018 Document Reviewed: 10/02/2017 Elsevier Patient Education  [place].  Pes Anserine Bursitis Rehab Ask your health care provider which exercises are safe for you. Do exercises exactly as told by your health care provider and adjust them as directed. It is normal to feel mild stretching, pulling, tightness, or discomfort as you do these exercises. Stop right away if you feel sudden pain or your pain gets worse. Do not begin these exercises until told by your health care provider. Stretching and range-of-motion exercises These exercises warm up your muscles and joints and improve the movement and flexibility of your knee. These exercises also help to relieve pain and stiffness. Hamstring stretch, doorway  1. Lie on your back in front of a doorway with your left / right leg resting against the wall and your other leg flat on the floor in the doorway. There should be a slight bend in  your left / right knee. 2. Straighten your left / right knee. You should feel a stretch behind your knee or thigh (hamstring). If you do not, scoot your buttocks closer to the door. 3. Hold this position for __________ seconds. Repeat __________ times. Complete this exercise __________ times a day. Seated stretch This exercise is sometimes called hamstrings and adductors stretch. 1. Sit on the floor with your legs stretched wide. Keep your knees straight during this exercise. 2. Keeping your head and back in a straight line, bend at your waist to reach for your left foot (position A). You should feel a stretch in your right inner thigh (adductors). 3. Hold for __________ seconds. Then slowly return to the upright position. 4. Keeping your head and back in a straight line, bend at your waist to reach forward (position B). You should feel a stretch behind both of your thighs or knees (hamstrings). 5. Hold for __________ seconds. Then slowly return to the upright position. 6. Keeping your head and back in a straight line, bend at your waist to reach for your right foot (position C). You should feel a stretch in your left inner thigh (adductors). 7. Hold for __________ seconds. Then slowly return to the upright position.  Repeat __________ times. Complete this exercise __________ times a day. Quadriceps, prone  1. Lie on your abdomen on a firm surface, such as a bed or padded floor (prone position). 2. Bend your left / right knee and hold your ankle. If you cannot reach your ankle or pant leg, loop a belt around your foot and grab the belt instead. 3. Gently pull your heel toward your buttocks. Your knee should not slide out to the side. You should feel a stretch in the front of your thigh and knee (quadriceps). 4. Hold this position for __________ seconds. Repeat __________ times. Complete this exercise __________ times a day. Strengthening exercises These exercises build strength and endurance in  your knee and hip. Endurance is the ability to use your muscles for a long time, even after they get tired. Quadriceps terminal knee extension 1. Secure a long loop of rubber exercise band around a sturdy object like a table leg. 2. Put the band behind your left / right knee. Step back from where the band is secured to put tension on the band. 3. Slowly bend your left / right knee. Keep your left / right foot flat on the floor. 4. Tighten the muscle in the front of your thigh (quadriceps) and push back against the band to straighten your knee until it is completely straight (terminal extension). 5. Hold this position for __________ seconds. 6. Return to having your left / right knee bent. Repeat __________ times. Complete this exercise __________ times a day. Straight leg raises, side-lying This exercise strengthens the muscles that rotate the leg at the hip and move it away from your body (hip abductors). 1. Lie on your side with your left / right leg in the top position. Lie so your head, shoulder, hip, and knee line up. You may bend your bottom knee to help you balance. 2. Lift your top leg 4-6 inches (10-15 cm) while keeping your toes pointed straight ahead. 3. Hold this position for __________ seconds. 4. Slowly lower your leg to the starting position. 5. Allow your muscles to relax completely after each repetition. Repeat __________ times. Complete this exercise __________ times a day. This information is not intended to replace advice given to you by your health care provider. Make sure you discuss any questions you have with your health care provider. Document Released: 04/23/2005 Document Revised: 08/14/2018 Document Reviewed: 02/13/2018 Elsevier Patient Education  2020 [place] DASH stands for "Dietary Approaches to Stop Hypertension." The DASH eating plan is a healthy eating plan that has been shown to reduce high blood pressure (hypertension). It may also  reduce your risk for type 2 diabetes, heart disease, and stroke. The DASH eating plan may also help with weight loss. What are tips for following this plan?  General guidelines  Avoid eating more than 2,300 mg (milligrams) of salt (sodium) a day. If you have hypertension, you may need to reduce your sodium intake to 1,500 mg a day.  Limit alcohol intake to no more than 1 drink a day for nonpregnant women and 2 drinks a day for men. One drink equals 12 oz of beer, 5 oz of wine, or 1 oz of hard liquor.  Work with your health care provider to maintain a healthy body weight or to lose weight. Ask what an ideal weight is for you.  Get at least 30 minutes of exercise that causes your heart to beat faster (aerobic exercise) most days of the week. Activities may  include walking, swimming, or biking.  Work with your health care provider or diet and nutrition specialist (dietitian) to adjust your eating plan to your individual calorie needs. Reading food labels   Check food labels for the amount of sodium per serving. Choose foods with less than 5 percent of the Daily Value of sodium. Generally, foods with less than 300 mg of sodium per serving fit into this eating plan.  To find whole grains, look for the word "whole" as the first word in the ingredient list. Shopping  Buy products labeled as "low-sodium" or "no salt added."  Buy fresh foods. Avoid canned foods and premade or frozen meals. Cooking  Avoid adding salt when cooking. Use salt-free seasonings or herbs instead of table salt or sea salt. Check with your health care provider or pharmacist before using salt substitutes.  Do not fry foods. Cook foods using healthy methods such as baking, boiling, grilling, and broiling instead.  Cook with heart-healthy oils, such as olive, canola, soybean, or sunflower oil. Meal planning  Eat a balanced diet that includes: ? 5 or more servings of fruits and vegetables each day. At each meal, try to  fill half of your plate with fruits and vegetables. ? Up to 6-8 servings of whole grains each day. ? Less than 6 oz of lean meat, poultry, or fish each day. A 3-oz serving of meat is about the same size as a deck of cards. One egg equals 1 oz. ? 2 servings of low-fat dairy each day. ? A serving of nuts, seeds, or beans 5 times each week. ? Heart-healthy fats. Healthy fats called Omega-3 fatty acids are found in foods such as flaxseeds and coldwater fish, like sardines, salmon, and mackerel.  Limit how much you eat of the following: ? Canned or prepackaged foods. ? Food that is high in trans fat, such as fried foods. ? Food that is high in saturated fat, such as fatty meat. ? Sweets, desserts, sugary drinks, and other foods with added sugar. ? Full-fat dairy products.  Do not salt foods before eating.  Try to eat at least 2 vegetarian meals each week.  Eat more home-cooked food and less restaurant, buffet, and fast food.  When eating at a restaurant, ask that your food be prepared with less salt or no salt, if possible. What foods are recommended? The items listed may not be a complete list. Talk with your dietitian about what dietary choices are best for you. Grains Whole-grain or whole-wheat bread. Whole-grain or whole-wheat pasta. Brown rice. [place]. Bulgur. Whole-grain and low-sodium cereals. Pita bread. Low-fat, low-sodium crackers. Whole-wheat flour tortillas. Vegetables Fresh or frozen vegetables (raw, steamed, roasted, or grilled). Low-sodium or reduced-sodium tomato and vegetable juice. Low-sodium or reduced-sodium tomato sauce and tomato paste. Low-sodium or reduced-sodium canned vegetables. Fruits All fresh, dried, or frozen fruit. Canned fruit in natural juice (without added sugar). Meat and other protein foods Skinless chicken or [place]. Ground chicken or [place]. Pork with fat trimmed off. Fish and seafood. Egg whites. Dried beans, peas, or lentils. Unsalted nuts,  nut butters, and seeds. Unsalted canned beans. Lean cuts of beef with fat trimmed off. Low-sodium, lean deli meat. Dairy Low-fat (1%) or fat-free (skim) milk. Fat-free, low-fat, or reduced-fat cheeses. Nonfat, low-sodium ricotta or cottage cheese. Low-fat or nonfat yogurt. Low-fat, low-sodium cheese. Fats and oils Soft margarine without trans fats. Vegetable oil. Low-fat, reduced-fat, or light mayonnaise and salad dressings (reduced-sodium). Canola, safflower, olive, soybean, and sunflower oils. Avocado. Seasoning and  other foods Herbs. Spices. Seasoning mixes without salt. Unsalted popcorn and pretzels. Fat-free sweets. What foods are not recommended? The items listed may not be a complete list. Talk with your dietitian about what dietary choices are best for you. Grains Baked goods made with fat, such as croissants, muffins, or some breads. Dry pasta or rice meal packs. Vegetables Creamed or fried vegetables. Vegetables in a cheese sauce. Regular canned vegetables (not low-sodium or reduced-sodium). Regular canned tomato sauce and paste (not low-sodium or reduced-sodium). Regular tomato and vegetable juice (not low-sodium or reduced-sodium). Angie Fava. Olives. Fruits Canned fruit in a light or heavy syrup. Fried fruit. Fruit in cream or butter sauce. Meat and other protein foods Fatty cuts of meat. Ribs. Fried meat. Berniece Salines. Sausage. Bologna and other processed lunch meats. Salami. Fatback. Hotdogs. Bratwurst. Salted nuts and seeds. Canned beans with added salt. Canned or smoked fish. Whole eggs or egg yolks. Chicken or [place] with skin. Dairy Whole or 2% milk, cream, and half-and-half. Whole or full-fat cream cheese. Whole-fat or sweetened yogurt. Full-fat cheese. Nondairy creamers. Whipped toppings. Processed cheese and cheese spreads. Fats and oils Butter. Stick margarine. Lard. Shortening. Ghee. Bacon fat. Tropical oils, such as coconut, palm kernel, or palm oil. Seasoning and other  foods Salted popcorn and pretzels. Onion salt, garlic salt, seasoned salt, table salt, and sea salt. Worcestershire sauce. Tartar sauce. Barbecue sauce. Teriyaki sauce. Soy sauce, including reduced-sodium. Steak sauce. Canned and packaged gravies. Fish sauce. Oyster sauce. Cocktail sauce. Horseradish that you find on the shelf. Ketchup. Mustard. Meat flavorings and tenderizers. Bouillon cubes. Hot sauce and Tabasco sauce. Premade or packaged marinades. Premade or packaged taco seasonings. Relishes. Regular salad dressings. Where to find more information:  National Heart, Lung, and [place]: https://wilson-eaton.com/  American Heart Association: www.heart.org Summary  The DASH eating plan is a healthy eating plan that has been shown to reduce high blood pressure (hypertension). It may also reduce your risk for type 2 diabetes, heart disease, and stroke.  With the DASH eating plan, you should limit salt (sodium) intake to 2,300 mg a day. If you have hypertension, you may need to reduce your sodium intake to 1,500 mg a day.  When on the DASH eating plan, aim to eat more fresh fruits and vegetables, whole grains, lean proteins, low-fat dairy, and heart-healthy fats.  Work with your health care provider or diet and nutrition specialist (dietitian) to adjust your eating plan to your individual calorie needs. This information is not intended to replace advice given to you by your health care provider. Make sure you discuss any questions you have with your health care provider. Document Released: 04/12/2011 Document Revised: 04/05/2017 Document Reviewed: 04/16/2016 Elsevier Patient Education  2020 [employer].

## 2019-02-20 ENCOUNTER — Ambulatory Visit: Payer: Self-pay | Admitting: Family Medicine

## 2019-03-20 ENCOUNTER — Ambulatory Visit: Payer: Self-pay | Admitting: Family Medicine

## 2019-06-26 ENCOUNTER — Ambulatory Visit (INDEPENDENT_AMBULATORY_CARE_PROVIDER_SITE_OTHER): Payer: 59 | Admitting: Family Medicine

## 2019-06-26 ENCOUNTER — Encounter: Payer: Self-pay | Admitting: Family Medicine

## 2019-06-26 ENCOUNTER — Other Ambulatory Visit: Payer: Self-pay

## 2019-06-26 VITALS — BP 158/81 | HR 76 | Temp 98.0°F | Ht 64.86 in | Wt 226.4 lb

## 2019-06-26 DIAGNOSIS — F3342 Major depressive disorder, recurrent, in full remission: Secondary | ICD-10-CM

## 2019-06-26 DIAGNOSIS — Z1211 Encounter for screening for malignant neoplasm of colon: Secondary | ICD-10-CM

## 2019-06-26 DIAGNOSIS — K219 Gastro-esophageal reflux disease without esophagitis: Secondary | ICD-10-CM

## 2019-06-26 DIAGNOSIS — Z23 Encounter for immunization: Secondary | ICD-10-CM | POA: Diagnosis not present

## 2019-06-26 DIAGNOSIS — Z0000 Encounter for general adult medical examination without abnormal findings: Secondary | ICD-10-CM

## 2019-06-26 DIAGNOSIS — I129 Hypertensive chronic kidney disease with stage 1 through stage 4 chronic kidney disease, or unspecified chronic kidney disease: Secondary | ICD-10-CM

## 2019-06-26 DIAGNOSIS — E538 Deficiency of other specified B group vitamins: Secondary | ICD-10-CM

## 2019-06-26 DIAGNOSIS — R718 Other abnormality of red blood cells: Secondary | ICD-10-CM

## 2019-06-26 DIAGNOSIS — J4521 Mild intermittent asthma with (acute) exacerbation: Secondary | ICD-10-CM

## 2019-06-26 DIAGNOSIS — F410 Panic disorder [episodic paroxysmal anxiety] without agoraphobia: Secondary | ICD-10-CM

## 2019-06-26 DIAGNOSIS — Z13220 Encounter for screening for lipoid disorders: Secondary | ICD-10-CM

## 2019-06-26 DIAGNOSIS — R8281 Pyuria: Secondary | ICD-10-CM

## 2019-06-26 DIAGNOSIS — N182 Chronic kidney disease, stage 2 (mild): Secondary | ICD-10-CM

## 2019-06-26 MED ORDER — FLUOXETINE HCL 20 MG PO CAPS: 20.0000 mg | ORAL_CAPSULE | Freq: Every day | ORAL | 1 refills | Status: DC

## 2019-06-26 MED ORDER — ALBUTEROL SULFATE HFA 108 (90 BASE) MCG/ACT IN AERS: 2.0000 | INHALATION_SPRAY | RESPIRATORY_TRACT | 6 refills | Status: DC | PRN

## 2019-06-26 MED ORDER — LISINOPRIL 10 MG PO TABS: 10.0000 mg | ORAL_TABLET | Freq: Every day | ORAL | 3 refills | Status: DC

## 2019-06-26 MED ORDER — MONTELUKAST SODIUM 10 MG PO TABS: 10.0000 mg | ORAL_TABLET | Freq: Every day | ORAL | 1 refills | Status: DC

## 2019-06-26 NOTE — Progress Notes (Signed)
BP (!) 158/81 (BP Location: Left Arm, Cuff Size: Normal)   Pulse 76   Temp 98 F (36.7 C) (Oral)   Ht 5' 4.86" (1.647 m)   Wt 226 lb 6.4 oz (102.7 kg)   LMP 05/07/2009   SpO2 97%   BMI 37.84 kg/m    Subjective:    Patient ID: Vanessa Harvey, female    DOB: [birth year], 59 y.o.   MRN: [id number]  HPI: Vanessa Harvey is a 59 y.o. female presenting on 06/26/2019 for comprehensive medical examination. Current medical complaints include:  HYPERTENSION Hypertension status: uncontrolled  Satisfied with current treatment? no Duration of hypertension: months BP monitoring frequency:  not checking BP medication side effects:  no Medication compliance: Not on anything Previous BP meds: none Aspirin: no Recurrent headaches: no Visual changes: no Palpitations: no Dyspnea: no Chest pain: no Lower extremity edema: no Dizzy/lightheaded: no   DEPRESSION Mood status: controlled Satisfied with current treatment?: yes Symptom severity: mild  Duration of current treatment : chronic Side effects: no Medication compliance: excellent compliance Psychotherapy/counseling: no  Previous psychiatric medications: fluoxetine Depressed mood: no Anxious mood: no Anhedonia: no Significant weight loss or gain: no Insomnia: no  Fatigue: no Feelings of worthlessness or guilt: no Impaired concentration/indecisiveness: no Suicidal ideations: no Hopelessness: no Crying spells: no Depression screen [facility] 2/9 06/26/2019 01/21/2019 12/31/2017 12/31/2017 10/25/2017  Decreased Interest 0 0 0 0 0  Down, Depressed, Hopeless 0 0 0 0 0  PHQ - 2 Score 0 0 0 0 0  Altered sleeping 2 0 0 - 0  Tired, decreased energy 1 0 0 - 0  Change in appetite 2 0 0 - 0  Feeling bad or failure about yourself  0 0 0 - 0  Trouble concentrating 0 0 0 - 0  Moving slowly or fidgety/restless 0 0 0 - 0  Suicidal thoughts 0 0 0 - 0  PHQ-9 Score 5 0 0 - 0  Difficult doing work/chores Not difficult at all Not difficult at all Not  difficult at all - Not difficult at all   Menopausal Symptoms: no  Depression Screen done today and results listed below:  Depression screen [facility] 2/9 06/26/2019 01/21/2019 12/31/2017 12/31/2017 10/25/2017  Decreased Interest 0 0 0 0 0  Down, Depressed, Hopeless 0 0 0 0 0  PHQ - 2 Score 0 0 0 0 0  Altered sleeping 2 0 0 - 0  Tired, decreased energy 1 0 0 - 0  Change in appetite 2 0 0 - 0  Feeling bad or failure about yourself  0 0 0 - 0  Trouble concentrating 0 0 0 - 0  Moving slowly or fidgety/restless 0 0 0 - 0  Suicidal thoughts 0 0 0 - 0  PHQ-9 Score 5 0 0 - 0  Difficult doing work/chores Not difficult at all Not difficult at all Not difficult at all - Not difficult at all    Past Medical History:  Past Medical History:  Diagnosis Date  . Allergic rhinitis, cause unspecified   . Anxiety state 12/22/2008   Qualifier: Diagnosis of  By: Glori Bickers MD, Carmell Austria   . Anxiety state, unspecified   . Asthma   . Breast discharge 12/12/2016   right  . Degenerative disc disease, lumbar   . Depressive disorder, not elsewhere classified   . Family history of stroke 12/12/2016  . GERD (gastroesophageal reflux disease)   . Low HDL (under 40) 10/25/2017  . Lumbago   . Numbness in  right leg 07/06/2015  . Other atopic dermatitis and related conditions   . Other B-complex deficiencies   . Panic disorder without agoraphobia   . Problems with swallowing and mastication   . Right facial numbness 11/29/2015  . Submandibular lymphadenopathy 06/29/2013  . Unspecified asthma(493.90)     Surgical History:  Past Surgical History:  Procedure Laterality Date  . Abd [place]  09/2005   Negative  . CESAREAN SECTION  1993  . DEXA  06/2004  . ESOPHAGOGASTRODUODENOSCOPY (EGD) WITH PROPOFOL N/A 04/08/2017   Procedure: ESOPHAGOGASTRODUODENOSCOPY (EGD) WITH PROPOFOL;  Surgeon: [name], MD;  Location: [facility];  Service: Endoscopy;  Laterality: N/A;  . KNEE ARTHROSCOPY  1984   evulsion of femur s/p fall     Medications:  Current Outpatient Medications on File Prior to Visit  Medication Sig  . aspirin EC 81 MG tablet Take 81 mg by mouth daily.  . Cyanocobalamin (VITAMIN B-12) 1000 MCG SUBL Place 1 tablet (1,000 mcg total) under the tongue daily.  Marland Kitchen esomeprazole (NEXIUM) 20 MG capsule Take 1 capsule (20 mg total) by mouth daily as needed.  . fluticasone (FLONASE) 50 MCG/ACT nasal spray Place 2 sprays into the nose daily.  . Multiple Vitamin (MULTIVITAMIN) tablet Take 1 tablet by mouth daily.     No current facility-administered medications on file prior to visit.    Allergies:  Allergies  Allergen Reactions  . Penicillins     Has patient had a PCN reaction causing immediate rash, facial/tongue/throat swelling, SOB or lightheadedness with hypotension: No Has patient had a PCN reaction causing severe rash involving mucus membranes or skin necrosis: Unknown Has patient had a PCN reaction that required hospitalization: Unknown Has patient had a PCN reaction occurring within the last 10 years: Unknown If all of the above answers are "NO", then may proceed with Cephalosporin use.     Social History:  Social History   Socioeconomic History  . Marital status: Married    Spouse name: Not on file  . Number of children: 2  . Years of education: Not on file  . Highest education level: Not on file  Occupational History  . Occupation: Art therapist  Tobacco Use  . Smoking status: Never Smoker  . Smokeless tobacco: Never Used  Substance and Sexual Activity  . Alcohol use: Yes    Comment: Rare  . Drug use: No  . Sexual activity: Yes    Birth control/protection: Post-menopausal  Other Topics Concern  . Not on file  Social History Narrative   Exercise-yes   Social Determinants of Health   Financial Resource Strain:   . Difficulty of Paying Living Expenses: Not on file  Food Insecurity:   . Worried About Charity fundraiser in the Last Year: Not on file  . Ran Out of Food in  the Last Year: Not on file  Transportation Needs:   . Lack of Transportation (Medical): Not on file  . Lack of Transportation (Non-Medical): Not on file  Physical Activity:   . Days of Exercise per Week: Not on file  . Minutes of Exercise per Session: Not on file  Stress:   . Feeling of Stress : Not on file  Social Connections:   . Frequency of Communication with Friends and Family: Not on file  . Frequency of Social Gatherings with Friends and Family: Not on file  . Attends Religious Services: Not on file  . Active Member of Clubs or Organizations: Not on file  . Attends  Club or Organization Meetings: Not on file  . Marital Status: Not on file  Intimate Partner Violence:   . Fear of Current or Ex-Partner: Not on file  . Emotionally Abused: Not on file  . Physically Abused: Not on file  . Sexually Abused: Not on file   Social History   Tobacco Use  Smoking Status Never Smoker  Smokeless Tobacco Never Used   Social History   Substance and Sexual Activity  Alcohol Use Yes   Comment: Rare    Family History:  Family History  Problem Relation Age of Onset  . Hypertension Father        Mild  . Ulcerative colitis Father   . Cancer Father        esophageal and colon  . Stroke Father   . COPD Father   . Arthritis Mother   . Diabetes Brother   . Diabetes Paternal Grandmother   . Arthritis Brother   . Heart disease Neg Hx   . Breast cancer Neg Hx     Past medical history, surgical history, medications, allergies, family history and social history reviewed with patient today and changes made to appropriate areas of the chart.   Review of Systems  Constitutional: Negative.   HENT: Negative.   Eyes: Negative.   Respiratory: Positive for cough. Negative for hemoptysis, sputum production, shortness of breath and wheezing.   Cardiovascular: Positive for palpitations and leg swelling. Negative for chest pain, orthopnea, claudication and PND.  Gastrointestinal: Negative.    Genitourinary: Negative.   Musculoskeletal: Positive for joint pain. Negative for back pain, falls, myalgias and neck pain.  Skin: Negative.   Neurological: Negative.   Endo/Heme/Allergies: Positive for environmental allergies. Negative for polydipsia. Does not bruise/bleed easily.  Psychiatric/Behavioral: Negative.     All other ROS negative except what is listed above and in the HPI.      Objective:    BP (!) 158/81 (BP Location: Left Arm, Cuff Size: Normal)   Pulse 76   Temp 98 F (36.7 C) (Oral)   Ht 5' 4.86" (1.647 m)   Wt 226 lb 6.4 oz (102.7 kg)   LMP 05/07/2009   SpO2 97%   BMI 37.84 kg/m   Wt Readings from Last 3 Encounters:  06/26/19 226 lb 6.4 oz (102.7 kg)  12/31/17 219 lb 3.2 oz (99.4 kg)  10/25/17 211 lb 8 oz (95.9 kg)    Physical Exam Vitals and nursing note reviewed. Exam conducted with a chaperone present.  Constitutional:      General: She is not in acute distress.    Appearance: Normal appearance. She is not ill-appearing, toxic-appearing or diaphoretic.  HENT:     Head: Normocephalic and atraumatic.     Right Ear: Tympanic membrane, ear canal and external ear normal. There is no impacted cerumen.     Left Ear: Tympanic membrane, ear canal and external ear normal. There is no impacted cerumen.     Nose: Nose normal. No congestion or rhinorrhea.     Mouth/Throat:     Mouth: Mucous membranes are moist.     Pharynx: Oropharynx is clear. No oropharyngeal exudate or posterior oropharyngeal erythema.  Eyes:     General: No scleral icterus.       Right eye: No discharge.        Left eye: No discharge.     Extraocular Movements: Extraocular movements intact.     Conjunctiva/sclera: Conjunctivae normal.     Pupils: Pupils are equal, round, and reactive  to light.  Neck:     Vascular: No carotid bruit.  Cardiovascular:     Rate and Rhythm: Normal rate and regular rhythm.     Pulses: Normal pulses.     Heart sounds: No murmur. No friction rub. No gallop.    Pulmonary:     Effort: Pulmonary effort is normal. No respiratory distress.     Breath sounds: Normal breath sounds. No stridor. No wheezing, rhonchi or rales.  Chest:     Chest wall: No tenderness.     Breasts:        Right: Normal. No swelling, bleeding, inverted nipple, mass, nipple discharge, skin change or tenderness.        Left: Normal. No swelling, bleeding, inverted nipple, mass, nipple discharge, skin change or tenderness.  Abdominal:     General: Abdomen is flat. Bowel sounds are normal. There is no distension.     Palpations: Abdomen is soft. There is no mass.     Tenderness: There is no abdominal tenderness. There is no right CVA tenderness, left CVA tenderness, guarding or rebound.     Hernia: No hernia is present.  Genitourinary:    Comments: Pelvic exams deferred with shared decision making Musculoskeletal:        General: No swelling, tenderness, deformity or signs of injury.     Cervical back: Normal range of motion and neck supple. No rigidity. No muscular tenderness.     Right lower leg: No edema.     Left lower leg: No edema.  Lymphadenopathy:     Cervical: No cervical adenopathy.     Upper Body:     Right upper body: No supraclavicular, axillary or pectoral adenopathy.     Left upper body: No supraclavicular, axillary or pectoral adenopathy.  Skin:    General: Skin is warm and dry.     Capillary Refill: Capillary refill takes less than 2 seconds.     Coloration: Skin is not jaundiced or pale.     Findings: No bruising, erythema, lesion or rash.  Neurological:     General: No focal deficit present.     Mental Status: She is alert and oriented to person, place, and time. Mental status is at baseline.     Cranial Nerves: No cranial nerve deficit.     Sensory: No sensory deficit.     Motor: No weakness.     Coordination: Coordination normal.     Gait: Gait normal.     Deep Tendon Reflexes: Reflexes normal.  Psychiatric:        Mood and Affect: Mood normal.         Behavior: Behavior normal.        Thought Content: Thought content normal.        Judgment: Judgment normal.     Results for orders placed or performed in visit on 06/26/19  Microscopic Examination   BLD  Result Value Ref Range   WBC, UA 0-5 0 - 5 /hpf   RBC 3-10 (A) 0 - 2 /hpf   Epithelial Cells (non renal) 0-10 0 - 10 /hpf   Bacteria, UA Moderate (A) None seen/Few  Urine Culture, Reflex   BLD  Result Value Ref Range   Urine Culture, Routine Final report    Organism ID, Bacteria Comment   Bayer DCA Hb A1c Waived  Result Value Ref Range   HB A1C (BAYER DCA - WAIVED) 5.7 <7.0 %  CBC with Differential/Platelet  Result Value Ref Range   WBC  9.0 3.4 - 10.8 x10E3/uL   RBC 4.63 3.77 - 5.28 x10E6/uL   Hemoglobin 11.3 11.1 - 15.9 g/dL   Hematocrit 35.1 34.0 - 46.6 %   MCV 76 (L) 79 - 97 fL   MCH 24.4 (L) 26.6 - 33.0 pg   MCHC 32.2 31.5 - 35.7 g/dL   RDW 15.2 11.7 - 15.4 %   Platelets 436 150 - 450 x10E3/uL   Neutrophils 54 Not Estab. %   Lymphs 33 Not Estab. %   Monocytes 9 Not Estab. %   Eos 3 Not Estab. %   Basos 1 Not Estab. %   Neutrophils Absolute 4.9 1.4 - 7.0 x10E3/uL   Lymphocytes Absolute 3.0 0.7 - 3.1 x10E3/uL   Monocytes Absolute 0.8 0.1 - 0.9 x10E3/uL   EOS (ABSOLUTE) 0.2 0.0 - 0.4 x10E3/uL   Basophils Absolute 0.1 0.0 - 0.2 x10E3/uL   Immature Granulocytes 0 Not Estab. %   Immature Grans (Abs) 0.0 0.0 - 0.1 x10E3/uL  Comprehensive metabolic panel  Result Value Ref Range   Glucose 95 65 - 99 mg/dL   BUN 11 8 - 27 mg/dL   Creatinine, Ser 0.77 0.57 - 1.00 mg/dL   GFR calc non Af Amer 84 >59 mL/min/1.73   GFR calc Af Amer 97 >59 mL/min/1.73   BUN/Creatinine Ratio 14 12 - 28   Sodium 140 134 - 144 mmol/L   Potassium 4.4 3.5 - 5.2 mmol/L   Chloride 102 96 - 106 mmol/L   CO2 26 20 - 29 mmol/L   Calcium 9.2 8.7 - 10.3 mg/dL   Total Protein 7.1 6.0 - 8.5 g/dL   Albumin 4.2 3.8 - 4.9 g/dL   Globulin, Total 2.9 1.5 - 4.5 g/dL   Albumin/Globulin Ratio 1.4  1.2 - 2.2   Bilirubin Total <0.2 0.0 - 1.2 mg/dL   Alkaline Phosphatase 105 39 - 117 IU/L   AST 22 0 - 40 IU/L   ALT 12 0 - 32 IU/L  Lipid Panel w/o Chol/HDL Ratio  Result Value Ref Range   Cholesterol, Total 165 100 - 199 mg/dL   Triglycerides 156 (H) 0 - 149 mg/dL   HDL 41 >39 mg/dL   VLDL Cholesterol Cal 27 5 - 40 mg/dL   LDL Chol Calc (NIH) 97 0 - 99 mg/dL  Microalbumin, Urine Waived  Result Value Ref Range   Microalb, Ur Waived 10 0 - 19 mg/L   Creatinine, Urine Waived 50 10 - 300 mg/dL   Microalb/Creat Ratio <30 <30 mg/g  TSH  Result Value Ref Range   TSH 1.900 0.450 - 4.500 uIU/mL  UA/M w/rflx Culture, Routine   Specimen: Blood   BLD  Result Value Ref Range   Specific Gravity, UA 1.010 1.005 - 1.030   pH, UA 6.5 5.0 - 7.5   Color, UA Yellow Yellow   Appearance Ur Clear Clear   Leukocytes,UA Negative Negative   Protein,UA Negative Negative/Trace   Glucose, UA Negative Negative   Ketones, UA Negative Negative   RBC, UA Trace (A) Negative   Bilirubin, UA Negative Negative   Urobilinogen, Ur 0.2 0.2 - 1.0 mg/dL   Nitrite, UA Negative Negative   Microscopic Examination See below:    Urinalysis Reflex Comment   B12  Result Value Ref Range   Vitamin B-12 1,417 (H) 232 - 1,245 pg/mL      Assessment & Plan:   Problem List Items Addressed This Visit      Respiratory   Asthma, mild intermittent  Under good control on current regimen. Continue current regimen. Continue to monitor. Call with any concerns. Refills given. Labs drawn today.       Relevant Medications   montelukast (SINGULAIR) 10 MG tablet   albuterol (PROVENTIL HFA) 108 (90 Base) MCG/ACT inhaler     Digestive   GERD (gastroesophageal reflux disease)    Under good control on current regimen. Continue current regimen. Continue to monitor. Call with any concerns. Refills given. Labs drawn today.       Relevant Orders   CBC with Differential/Platelet (Completed)   Comprehensive metabolic panel  (Completed)     Genitourinary   Benign hypertensive renal disease    Will start her on lisinopril and recheck 1 month. Call with any concerns.       Relevant Orders   CBC with Differential/Platelet (Completed)   Comprehensive metabolic panel (Completed)   Microalbumin, Urine Waived (Completed)   UA/M w/rflx Culture, Routine (Completed)   CKD (chronic kidney disease) stage 2, GFR 60-89 ml/min    Rechecking levels today. Await results.       Relevant Orders   CBC with Differential/Platelet (Completed)   Comprehensive metabolic panel (Completed)   Microalbumin, Urine Waived (Completed)   UA/M w/rflx Culture, Routine (Completed)     Other   B12 deficiency    Rechecking levels today. Await results.       Relevant Orders   CBC with Differential/Platelet (Completed)   Comprehensive metabolic panel (Completed)   B12 (Completed)   PANIC DISORDER    Under good control on current regimen. Continue current regimen. Continue to monitor. Call with any concerns. Refills given. Labs drawn today.       Relevant Medications   FLUoxetine (PROZAC) 20 MG capsule   Other Relevant Orders   CBC with Differential/Platelet (Completed)   Comprehensive metabolic panel (Completed)   TSH (Completed)   Major depression, recurrent, full remission ([place])    Under good control on current regimen. Continue current regimen. Continue to monitor. Call with any concerns. Refills given. Labs drawn today.       Relevant Medications   FLUoxetine (PROZAC) 20 MG capsule   Other Relevant Orders   CBC with Differential/Platelet (Completed)   Comprehensive metabolic panel (Completed)   TSH (Completed)   RBC microcytosis    Rechecking labs today. Await results.       Relevant Orders   CBC with Differential/Platelet (Completed)   Comprehensive metabolic panel (Completed)    Other Visit Diagnoses    Routine general medical examination at a health care facility    -  Primary   Vaccines up to date.  Screening labs checked today. Pap up to date. Mammogram up to date. Colonoscopy due in a couple months. Continue diet and exercise.    Relevant Orders   Bayer DCA Hb A1c Waived (Completed)   CBC with Differential/Platelet (Completed)   Comprehensive metabolic panel (Completed)   Lipid Panel w/o Chol/HDL Ratio (Completed)   Microalbumin, Urine Waived (Completed)   TSH (Completed)   UA/M w/rflx Culture, Routine (Completed)   B12 (Completed)   Screening for cholesterol level       Labs drawn today. Await results.    Relevant Orders   Lipid Panel w/o Chol/HDL Ratio (Completed)   Screening for colon cancer       Due in March- referral placed.    Relevant Orders   Ambulatory referral to Gastroenterology       Follow up plan: Return in about 4 weeks (around 07/24/2019)  for follow up BP.   LABORATORY TESTING:  - Pap smear: up to date  IMMUNIZATIONS:   - Tdap: Tetanus vaccination status reviewed: Td vaccination indicated and given today. - Influenza: Up to date - Pneumovax: Not applicable  SCREENING: -Mammogram: Ordered today  - Colonoscopy: Up to date  - Bone Density: Not applicable   PATIENT COUNSELING:   Advised to take 1 mg of folate supplement per day if capable of pregnancy.   Sexuality: Discussed sexually transmitted diseases, partner selection, use of condoms, avoidance of unintended pregnancy  and contraceptive alternatives.   Advised to avoid cigarette smoking.  I discussed with the patient that most people either abstain from alcohol or drink within safe limits (<=14/week and <=4 drinks/occasion for males, <=7/weeks and <= 3 drinks/occasion for females) and that the risk for alcohol disorders and other health effects rises proportionally with the number of drinks per week and how often a drinker exceeds daily limits.  Discussed cessation/primary prevention of drug use and availability of treatment for abuse.   Diet: Encouraged to adjust caloric intake to maintain  or  achieve ideal body weight, to reduce intake of dietary saturated fat and total fat, to limit sodium intake by avoiding high sodium foods and not adding table salt, and to maintain adequate dietary potassium and calcium preferably from fresh fruits, vegetables, and low-fat dairy products.    stressed the importance of regular exercise  Injury prevention: Discussed safety belts, safety helmets, smoke detector, smoking near bedding or upholstery.   Dental health: Discussed importance of regular tooth brushing, flossing, and dental visits.    NEXT PREVENTATIVE PHYSICAL DUE IN 1 YEAR. Return in about 4 weeks (around 07/24/2019) for follow up BP.

## 2019-06-26 NOTE — Patient Instructions (Addendum)
Call to schedule your mammogram  [facility] at [facility]  Address: [address], [place], [place] [address]  Phone: [phone]   Health Maintenance, Female Adopting a healthy lifestyle and getting preventive care are important in promoting health and wellness. Ask your health care provider about:  The right schedule for you to have regular tests and exams.  Things you can do on your own to prevent diseases and keep yourself healthy. What should I know about diet, weight, and exercise? Eat a healthy diet   Eat a diet that includes plenty of vegetables, fruits, low-fat dairy products, and lean protein.  Do not eat a lot of foods that are high in solid fats, added sugars, or sodium. Maintain a healthy weight Body mass index (BMI) is used to identify weight problems. It estimates body fat based on height and weight. Your health care provider can help determine your BMI and help you achieve or maintain a healthy weight. Get regular exercise Get regular exercise. This is one of the most important things you can do for your health. Most adults should:  Exercise for at least 150 minutes each week. The exercise should increase your heart rate and make you sweat (moderate-intensity exercise).  Do strengthening exercises at least twice a week. This is in addition to the moderate-intensity exercise.  Spend less time sitting. Even light physical activity can be beneficial. Watch cholesterol and blood lipids Have your blood tested for lipids and cholesterol at 61 years of age, then have this test every 5 years. Have your cholesterol levels checked more often if:  Your lipid or cholesterol levels are high.  You are older than 61 years of age.  You are at high risk for heart disease. What should I know about cancer screening? Depending on your health history and family history, you may need to have cancer screening at various ages. This may include screening  for:  Breast cancer.  Cervical cancer.  Colorectal cancer.  Skin cancer.  Lung cancer. What should I know about heart disease, diabetes, and high blood pressure? Blood pressure and heart disease  High blood pressure causes heart disease and increases the risk of stroke. This is more likely to develop in people who have high blood pressure readings, are of African descent, or are overweight.  Have your blood pressure checked: ? Every 3-5 years if you are 18-39 years of age. ? Every year if you are 40 years old or older. Diabetes Have regular diabetes screenings. This checks your fasting blood sugar level. Have the screening done:  Once every three years after age 40 if you are at a normal weight and have a low risk for diabetes.  More often and at a younger age if you are overweight or have a high risk for diabetes. What should I know about preventing infection? Hepatitis B If you have a higher risk for hepatitis B, you should be screened for this virus. Talk with your health care provider to find out if you are at risk for hepatitis B infection. Hepatitis C Testing is recommended for:  Everyone born from 1945 through 1965.  Anyone with known risk factors for hepatitis C. Sexually transmitted infections (STIs)  Get screened for STIs, including gonorrhea and chlamydia, if: ? You are sexually active and are younger than 61 years of age. ? You are older than 61 years of age and your health care provider tells you that you are at risk for this type of   infection. ? Your sexual activity has changed since you were last screened, and you are at increased risk for chlamydia or gonorrhea. Ask your health care provider if you are at risk.  Ask your health care provider about whether you are at high risk for HIV. Your health care provider may recommend a prescription medicine to help prevent HIV infection. If you choose to take medicine to prevent HIV, you should first get tested for HIV.  You should then be tested every 3 months for as long as you are taking the medicine. Pregnancy  If you are about to stop having your period (premenopausal) and you may become pregnant, seek counseling before you get pregnant.  Take 400 to 800 micrograms (mcg) of folic acid every day if you become pregnant.  Ask for birth control (contraception) if you want to prevent pregnancy. Osteoporosis and menopause Osteoporosis is a disease in which the bones lose minerals and strength with aging. This can result in bone fractures. If you are 65 years old or older, or if you are at risk for osteoporosis and fractures, ask your health care provider if you should:  Be screened for bone loss.  Take a calcium or vitamin D supplement to lower your risk of fractures.  Be given hormone replacement therapy (HRT) to treat symptoms of menopause. Follow these instructions at home: Lifestyle  Do not use any products that contain nicotine or tobacco, such as cigarettes, e-cigarettes, and chewing tobacco. If you need help quitting, ask your health care provider.  Do not use street drugs.  Do not share needles.  Ask your health care provider for help if you need support or information about quitting drugs. Alcohol use  Do not drink alcohol if: ? Your health care provider tells you not to drink. ? You are pregnant, may be pregnant, or are planning to become pregnant.  If you drink alcohol: ? Limit how much you use to 0-1 drink a day. ? Limit intake if you are breastfeeding.  Be aware of how much alcohol is in your drink. In the [place] one drink equals one 12 oz bottle of beer (355 mL), one 5 oz glass of wine (148 mL), or one 1 oz glass of hard liquor (44 mL). General instructions  Schedule regular health, dental, and eye exams.  Stay current with your vaccines.  Tell your health care provider if: ? You often feel depressed. ? You have ever been abused or do not feel safe at  home. Summary  Adopting a healthy lifestyle and getting preventive care are important in promoting health and wellness.  Follow your health care provider's instructions about healthy diet, exercising, and getting tested or screened for diseases.  Follow your health care provider's instructions on monitoring your cholesterol and blood pressure. This information is not intended to replace advice given to you by your health care provider. Make sure you discuss any questions you have with your health care provider. Document Revised: 04/16/2018 Document Reviewed: 04/16/2018 Elsevier Patient Education  2020 [employer]  

## 2019-06-27 LAB — CBC WITH DIFFERENTIAL/PLATELET
Basophils Absolute: 0.1 10*3/uL (ref 0.0–0.2)
Basos: 1 %
EOS (ABSOLUTE): 0.2 10*3/uL (ref 0.0–0.4)
Eos: 3 %
Hematocrit: 35.1 % (ref 34.0–46.6)
Hemoglobin: 11.3 g/dL (ref 11.1–15.9)
Immature Grans (Abs): 0 10*3/uL (ref 0.0–0.1)
Immature Granulocytes: 0 %
Lymphocytes Absolute: 3 10*3/uL (ref 0.7–3.1)
Lymphs: 33 %
MCH: 24.4 pg — ABNORMAL LOW (ref 26.6–33.0)
MCHC: 32.2 g/dL (ref 31.5–35.7)
MCV: 76 fL — ABNORMAL LOW (ref 79–97)
Monocytes Absolute: 0.8 10*3/uL (ref 0.1–0.9)
Monocytes: 9 %
Neutrophils Absolute: 4.9 10*3/uL (ref 1.4–7.0)
Neutrophils: 54 %
Platelets: 436 10*3/uL (ref 150–450)
RBC: 4.63 x10E6/uL (ref 3.77–5.28)
RDW: 15.2 % (ref 11.7–15.4)
WBC: 9 10*3/uL (ref 3.4–10.8)

## 2019-06-27 LAB — COMPREHENSIVE METABOLIC PANEL
ALT: 12 IU/L (ref 0–32)
AST: 22 IU/L (ref 0–40)
Albumin/Globulin Ratio: 1.4 (ref 1.2–2.2)
Albumin: 4.2 g/dL (ref 3.8–4.9)
Alkaline Phosphatase: 105 IU/L (ref 39–117)
BUN/Creatinine Ratio: 14 (ref 12–28)
BUN: 11 mg/dL (ref 8–27)
Bilirubin Total: <0.2 mg/dL (ref 0.0–1.2)
CO2: 26 mmol/L (ref 20–29)
Calcium: 9.2 mg/dL (ref 8.7–10.3)
Chloride: 102 mmol/L (ref 96–106)
Creatinine, Ser: 0.77 mg/dL (ref 0.57–1.00)
GFR calc Af Amer: 97 mL/min/{1.73_m2} (ref >59)
GFR calc non Af Amer: 84 mL/min/{1.73_m2} (ref >59)
Globulin, Total: 2.9 g/dL (ref 1.5–4.5)
Glucose: 95 mg/dL (ref 65–99)
Potassium: 4.4 mmol/L (ref 3.5–5.2)
Sodium: 140 mmol/L (ref 134–144)
Total Protein: 7.1 g/dL (ref 6.0–8.5)

## 2019-06-27 LAB — TSH: TSH: 1.9 u[IU]/mL (ref 0.450–4.500)

## 2019-06-27 LAB — LIPID PANEL W/O CHOL/HDL RATIO
Cholesterol, Total: 165 mg/dL (ref 100–199)
HDL: 41 mg/dL (ref >39)
LDL Chol Calc (NIH): 97 mg/dL (ref 0–99)
Triglycerides: 156 mg/dL — ABNORMAL HIGH (ref 0–149)
VLDL Cholesterol Cal: 27 mg/dL (ref 5–40)

## 2019-06-27 LAB — VITAMIN B12: Vitamin B-12: 1417 pg/mL — ABNORMAL HIGH (ref 232–1245)

## 2019-06-28 LAB — MICROSCOPIC EXAMINATION

## 2019-06-28 LAB — UA/M W/RFLX CULTURE, ROUTINE
Bilirubin, UA: NEGATIVE
Glucose, UA: NEGATIVE
Ketones, UA: NEGATIVE
Leukocytes,UA: NEGATIVE
Nitrite, UA: NEGATIVE
Protein,UA: NEGATIVE
Specific Gravity, UA: 1.01 (ref 1.005–1.030)
Urobilinogen, Ur: 0.2 mg/dL (ref 0.2–1.0)
pH, UA: 6.5 (ref 5.0–7.5)

## 2019-06-28 LAB — MICROALBUMIN, URINE WAIVED
Creatinine, Urine Waived: 50 mg/dL (ref 10–300)
Microalb, Ur Waived: 10 mg/L (ref 0–19)
Microalb/Creat Ratio: <30 mg/g (ref <30)

## 2019-06-28 LAB — URINE CULTURE, REFLEX

## 2019-06-28 LAB — BAYER DCA HB A1C WAIVED: HB A1C (BAYER DCA - WAIVED): 5.7 % (ref <7.0)

## 2019-06-29 ENCOUNTER — Encounter: Payer: Self-pay | Admitting: Family Medicine

## 2019-06-30 NOTE — Assessment & Plan Note (Signed)
Under good control on current regimen. Continue current regimen. Continue to monitor. Call with any concerns. Refills given. Labs drawn today.   

## 2019-06-30 NOTE — Assessment & Plan Note (Signed)
Will start her on lisinopril and recheck 1 month. Call with any concerns.

## 2019-06-30 NOTE — Assessment & Plan Note (Signed)
Rechecking levels today. Await results.  

## 2019-06-30 NOTE — Assessment & Plan Note (Signed)
Rechecking labs today. Await results.  

## 2019-07-20 ENCOUNTER — Encounter: Payer: Self-pay | Admitting: *Deleted

## 2019-07-24 ENCOUNTER — Ambulatory Visit: Payer: 59 | Admitting: Family Medicine

## 2019-08-14 ENCOUNTER — Ambulatory Visit (INDEPENDENT_AMBULATORY_CARE_PROVIDER_SITE_OTHER): Payer: 59 | Admitting: Family Medicine

## 2019-08-14 ENCOUNTER — Encounter: Payer: Self-pay | Admitting: Family Medicine

## 2019-08-14 ENCOUNTER — Other Ambulatory Visit: Payer: Self-pay

## 2019-08-14 VITALS — BP 134/78 | HR 87 | Temp 99.0°F | Wt 228.5 lb

## 2019-08-14 DIAGNOSIS — I129 Hypertensive chronic kidney disease with stage 1 through stage 4 chronic kidney disease, or unspecified chronic kidney disease: Secondary | ICD-10-CM | POA: Diagnosis not present

## 2019-08-14 NOTE — Progress Notes (Signed)
BP 134/78   Pulse 87   Temp 99 F (37.2 C)   Wt 228 lb 8 oz (103.6 kg)   LMP 05/07/2009   SpO2 96%   BMI 38.19 kg/m    Subjective:    Patient ID: Vanessa Harvey, female    DOB: Sep 16, 1960, 59 y.o.   MRN: [id number]  HPI: Vanessa Harvey is a 59 y.o. female  Chief Complaint  Patient presents with  . Hypertension   HYPERTENSION Hypertension status: controlled  Satisfied with current treatment? yes Duration of hypertension: chronic BP monitoring frequency:  not checking BP medication side effects:  no Medication compliance: excellent compliance Previous BP meds: lisinopril Aspirin: no Recurrent headaches: no Visual changes: no Palpitations: no Dyspnea: no Chest pain: no Lower extremity edema: no Dizzy/lightheaded: no   Relevant past medical, surgical, family and social history reviewed and updated as indicated. Interim medical history since our last visit reviewed. Allergies and medications reviewed and updated.  Review of Systems  Constitutional: Negative.   Respiratory: Negative.   Cardiovascular: Negative.   Gastrointestinal: Negative.   Musculoskeletal: Negative.   Psychiatric/Behavioral: Negative.     Per HPI unless specifically indicated above     Objective:    BP 134/78   Pulse 87   Temp 99 F (37.2 C)   Wt 228 lb 8 oz (103.6 kg)   LMP 05/07/2009   SpO2 96%   BMI 38.19 kg/m   Wt Readings from Last 3 Encounters:  08/14/19 228 lb 8 oz (103.6 kg)  06/26/19 226 lb 6.4 oz (102.7 kg)  12/31/17 219 lb 3.2 oz (99.4 kg)    Physical Exam Vitals and nursing note reviewed.  Constitutional:      General: She is not in acute distress.    Appearance: Normal appearance. She is not ill-appearing, toxic-appearing or diaphoretic.  HENT:     Head: Normocephalic and atraumatic.     Right Ear: External ear normal.     Left Ear: External ear normal.     Nose: Nose normal.     Mouth/Throat:     Mouth: Mucous membranes are moist.     Pharynx:  Oropharynx is clear.  Eyes:     General: No scleral icterus.       Right eye: No discharge.        Left eye: No discharge.     Extraocular Movements: Extraocular movements intact.     Conjunctiva/sclera: Conjunctivae normal.     Pupils: Pupils are equal, round, and reactive to light.  Cardiovascular:     Rate and Rhythm: Normal rate and regular rhythm.     Pulses: Normal pulses.     Heart sounds: Normal heart sounds. No murmur. No friction rub. No gallop.   Pulmonary:     Effort: Pulmonary effort is normal. No respiratory distress.     Breath sounds: Normal breath sounds. No stridor. No wheezing, rhonchi or rales.  Chest:     Chest wall: No tenderness.  Musculoskeletal:        General: Normal range of motion.     Cervical back: Normal range of motion and neck supple.  Skin:    General: Skin is warm and dry.     Capillary Refill: Capillary refill takes less than 2 seconds.     Coloration: Skin is not jaundiced or pale.     Findings: No bruising, erythema, lesion or rash.  Neurological:     General: No focal deficit present.     Mental  Status: She is alert and oriented to person, place, and time. Mental status is at baseline.  Psychiatric:        Mood and Affect: Mood normal.        Behavior: Behavior normal.        Thought Content: Thought content normal.        Judgment: Judgment normal.     Results for orders placed or performed in visit on 06/26/19  Microscopic Examination   BLD  Result Value Ref Range   WBC, UA 0-5 0 - 5 /hpf   RBC 3-10 (A) 0 - 2 /hpf   Epithelial Cells (non renal) 0-10 0 - 10 /hpf   Bacteria, UA Moderate (A) None seen/Few  Urine Culture, Reflex   BLD  Result Value Ref Range   Urine Culture, Routine Final report    Organism ID, Bacteria Comment   Bayer DCA Hb A1c Waived  Result Value Ref Range   HB A1C (BAYER DCA - WAIVED) 5.7 <7.0 %  CBC with Differential/Platelet  Result Value Ref Range   WBC 9.0 3.4 - 10.8 x10E3/uL   RBC 4.63 3.77 - 5.28  x10E6/uL   Hemoglobin 11.3 11.1 - 15.9 g/dL   Hematocrit 35.1 34.0 - 46.6 %   MCV 76 (L) 79 - 97 fL   MCH 24.4 (L) 26.6 - 33.0 pg   MCHC 32.2 31.5 - 35.7 g/dL   RDW 15.2 11.7 - 15.4 %   Platelets 436 150 - 450 x10E3/uL   Neutrophils 54 Not Estab. %   Lymphs 33 Not Estab. %   Monocytes 9 Not Estab. %   Eos 3 Not Estab. %   Basos 1 Not Estab. %   Neutrophils Absolute 4.9 1.4 - 7.0 x10E3/uL   Lymphocytes Absolute 3.0 0.7 - 3.1 x10E3/uL   Monocytes Absolute 0.8 0.1 - 0.9 x10E3/uL   EOS (ABSOLUTE) 0.2 0.0 - 0.4 x10E3/uL   Basophils Absolute 0.1 0.0 - 0.2 x10E3/uL   Immature Granulocytes 0 Not Estab. %   Immature Grans (Abs) 0.0 0.0 - 0.1 x10E3/uL  Comprehensive metabolic panel  Result Value Ref Range   Glucose 95 65 - 99 mg/dL   BUN 11 8 - 27 mg/dL   Creatinine, Ser 0.77 0.57 - 1.00 mg/dL   GFR calc non Af Amer 84 >59 mL/min/1.73   GFR calc Af Amer 97 >59 mL/min/1.73   BUN/Creatinine Ratio 14 12 - 28   Sodium 140 134 - 144 mmol/L   Potassium 4.4 3.5 - 5.2 mmol/L   Chloride 102 96 - 106 mmol/L   CO2 26 20 - 29 mmol/L   Calcium 9.2 8.7 - 10.3 mg/dL   Total Protein 7.1 6.0 - 8.5 g/dL   Albumin 4.2 3.8 - 4.9 g/dL   Globulin, Total 2.9 1.5 - 4.5 g/dL   Albumin/Globulin Ratio 1.4 1.2 - 2.2   Bilirubin Total <0.2 0.0 - 1.2 mg/dL   Alkaline Phosphatase 105 39 - 117 IU/L   AST 22 0 - 40 IU/L   ALT 12 0 - 32 IU/L  Lipid Panel w/o Chol/HDL Ratio  Result Value Ref Range   Cholesterol, Total 165 100 - 199 mg/dL   Triglycerides 156 (H) 0 - 149 mg/dL   HDL 41 >39 mg/dL   VLDL Cholesterol Cal 27 5 - 40 mg/dL   LDL Chol Calc (NIH) 97 0 - 99 mg/dL  Microalbumin, Urine Waived  Result Value Ref Range   Microalb, Ur Waived 10 0 - 19 mg/L  Creatinine, Urine Waived 50 10 - 300 mg/dL   Microalb/Creat Ratio <30 <30 mg/g  TSH  Result Value Ref Range   TSH 1.900 0.450 - 4.500 uIU/mL  UA/M w/rflx Culture, Routine   Specimen: Blood   BLD  Result Value Ref Range   Specific Gravity, UA 1.010  1.005 - 1.030   pH, UA 6.5 5.0 - 7.5   Color, UA Yellow Yellow   Appearance Ur Clear Clear   Leukocytes,UA Negative Negative   Protein,UA Negative Negative/Trace   Glucose, UA Negative Negative   Ketones, UA Negative Negative   RBC, UA Trace (A) Negative   Bilirubin, UA Negative Negative   Urobilinogen, Ur 0.2 0.2 - 1.0 mg/dL   Nitrite, UA Negative Negative   Microscopic Examination See below:    Urinalysis Reflex Comment   B12  Result Value Ref Range   Vitamin B-12 1,417 (H) 232 - 1,245 pg/mL      Assessment & Plan:   Problem List Items Addressed This Visit      Genitourinary   Benign hypertensive renal disease - Primary    Under good control on current regimen. Continue current regimen. Continue to monitor. Call with any concerns. Refills given. Labs drawn today.       Relevant Orders   Basic metabolic panel       Follow up plan: Return in about 6 months (around 02/13/2020).

## 2019-08-14 NOTE — Assessment & Plan Note (Signed)
Under good control on current regimen. Continue current regimen. Continue to monitor. Call with any concerns. Refills given. Labs drawn today.   

## 2019-08-15 LAB — BASIC METABOLIC PANEL
BUN/Creatinine Ratio: 13 (ref 12–28)
BUN: 9 mg/dL (ref 8–27)
CO2: 21 mmol/L (ref 20–29)
Calcium: 9.5 mg/dL (ref 8.7–10.3)
Chloride: 105 mmol/L (ref 96–106)
Creatinine, Ser: 0.69 mg/dL (ref 0.57–1.00)
GFR calc Af Amer: 109 mL/min/{1.73_m2} (ref >59)
GFR calc non Af Amer: 95 mL/min/{1.73_m2} (ref >59)
Glucose: 106 mg/dL — ABNORMAL HIGH (ref 65–99)
Potassium: 4.4 mmol/L (ref 3.5–5.2)
Sodium: 144 mmol/L (ref 134–144)

## 2019-08-17 ENCOUNTER — Encounter: Payer: Self-pay | Admitting: Family Medicine

## 2020-02-19 ENCOUNTER — Other Ambulatory Visit: Payer: Self-pay

## 2020-02-19 ENCOUNTER — Encounter: Payer: Self-pay | Admitting: Family Medicine

## 2020-02-19 ENCOUNTER — Ambulatory Visit (INDEPENDENT_AMBULATORY_CARE_PROVIDER_SITE_OTHER): Payer: 59 | Admitting: Family Medicine

## 2020-02-19 VITALS — BP 139/81 | HR 69 | Temp 97.4°F | Resp 16 | Wt 208.4 lb

## 2020-02-19 DIAGNOSIS — M545 Low back pain, unspecified: Secondary | ICD-10-CM | POA: Diagnosis not present

## 2020-02-19 DIAGNOSIS — I129 Hypertensive chronic kidney disease with stage 1 through stage 4 chronic kidney disease, or unspecified chronic kidney disease: Secondary | ICD-10-CM | POA: Diagnosis not present

## 2020-02-19 DIAGNOSIS — J4521 Mild intermittent asthma with (acute) exacerbation: Secondary | ICD-10-CM

## 2020-02-19 DIAGNOSIS — F3342 Major depressive disorder, recurrent, in full remission: Secondary | ICD-10-CM | POA: Diagnosis not present

## 2020-02-19 MED ORDER — FLUOXETINE HCL 20 MG PO CAPS: 20.0000 mg | ORAL_CAPSULE | Freq: Every day | ORAL | 1 refills | Status: DC

## 2020-02-19 MED ORDER — MONTELUKAST SODIUM 10 MG PO TABS: 10.0000 mg | ORAL_TABLET | Freq: Every day | ORAL | 1 refills | Status: DC

## 2020-02-19 MED ORDER — ALBUTEROL SULFATE HFA 108 (90 BASE) MCG/ACT IN AERS: 2.0000 | INHALATION_SPRAY | RESPIRATORY_TRACT | 6 refills | Status: DC | PRN

## 2020-02-19 NOTE — Patient Instructions (Addendum)
Sacroiliac Joint Dysfunction  Sacroiliac joint dysfunction is a condition that causes inflammation on one or both sides of the sacroiliac (SI) joint. The SI joint connects the lower part of the spine (sacrum) with the two upper portions of the pelvis (ilium). This condition causes deep aching or burning pain in the low back. In some cases, the pain may also spread into one or both buttocks, hips, or thighs. What are the causes? This condition may be caused by:  Pregnancy. During pregnancy, extra stress is put on the SI joints because the pelvis widens.  Injury, such as: ? Injuries from car accidents. ? Sports-related injuries. ? Work-related injuries.  Having one leg that is shorter than the other.  Conditions that affect the joints, such as: ? Rheumatoid arthritis. ? Gout. ? Psoriatic arthritis. ? Joint infection (septic arthritis). Sometimes, the cause of SI joint dysfunction is not known. What are the signs or symptoms? Symptoms of this condition include:  Aching or burning pain in the lower back. The pain may also spread to other areas, such as: ? Buttocks. ? Groin. ? Thighs.  Muscle spasms in or around the painful areas.  Increased pain when standing, walking, running, stair climbing, bending, or lifting. How is this diagnosed? This condition is diagnosed with a physical exam and medical history. During the exam, the health care provider may move one or both of your legs to different positions to check for pain. Various tests may be done to confirm the diagnosis, including:  Imaging tests to look for other causes of pain. These may include: ? MRI. ? CT scan. ? Bone scan.  Diagnostic injection. A numbing medicine is injected into the SI joint using a needle. If your pain is temporarily improved or stopped after the injection, this can indicate that SI joint dysfunction is the problem. How is this treated? Treatment depends on the cause and severity of your condition.  Treatment options may include:  Ice or heat applied to the lower back area after an injury. This may help reduce pain and muscle spasms.  Medicines to relieve pain or inflammation or to relax the muscles.  Wearing a back brace (sacroiliac brace) to help support the joint while your back is healing.  Physical therapy to increase muscle strength around the joint and flexibility at the joint. This may also involve learning proper body positions and ways of moving to relieve stress on the joint.  Direct manipulation of the SI joint.  Injections of steroid medicine into the joint to reduce pain and swelling.  Radiofrequency ablation to burn away nerves that are carrying pain messages from the joint.  Use of a device that provides electrical stimulation to help reduce pain at the joint.  Surgery to put in screws and plates that limit or prevent joint motion. This is rare. Follow these instructions at home: Medicines  Take over-the-counter and prescription medicines only as told by your health care provider.  Do not drive or use heavy machinery while taking prescription pain medicine.  If you are taking prescription pain medicine, take actions to prevent or treat constipation. Your health care provider may recommend that you: ? Drink enough fluid to keep your urine pale yellow. ? Eat foods that are high in fiber, such as fresh fruits and vegetables, whole grains, and beans. ? Limit foods that are high in fat and processed sugars, such as fried or sweet foods. ? Take an over-the-counter or prescription medicine for constipation. If you have a brace:    Wear the brace as told by your health care provider. Remove it only as told by your health care provider.  Keep the brace clean.  If the brace is not waterproof: ? Do not let it get wet. ? Cover it with a watertight covering when you take a bath or a shower. Managing pain, stiffness, and swelling      Icing can help with pain and  swelling. Heat may help with muscle tension or spasms. Ask your health care provider if you should use ice or heat.  If directed, put ice on the affected area: ? If you have a removable brace, remove it as told by your health care provider. ? Put ice in a plastic bag. ? Place a towel between your skin and the bag. ? Leave the ice on for 20 minutes, 2-3 times a day.  If directed, apply heat to the affected area. Use the heat source that your health care provider recommends, such as a moist heat pack or a heating pad. ? Place a towel between your skin and the heat source. ? Leave the heat on for 20-30 minutes. ? Remove the heat if your skin turns bright red. This is especially important if you are unable to feel pain, heat, or cold. You may have a greater risk of getting burned. General instructions  Rest as needed. Ask your health care provider what activities are safe for you.  Return to your normal activities as told by your health care provider.  Exercise as directed by your health care provider or physical therapist.  Do not use any products that contain nicotine or tobacco, such as cigarettes and e-cigarettes. These can delay bone healing. If you need help quitting, ask your health care provider.  Keep all follow-up visits as told by your health care provider. This is important. Contact a health care provider if:  Your pain is not controlled with medicine.  You have a fever.  Your pain is getting worse. Get help right away if:  You have weakness, numbness, or tingling in your legs or feet.  You lose control of your bladder or bowel. Summary  Sacroiliac joint dysfunction is a condition that causes inflammation on one or both sides of the sacroiliac (SI) joint.  This condition causes deep aching or burning pain in the low back. In some cases, the pain may also spread into one or both buttocks, hips, or thighs.  Treatment depends on the cause and severity of your condition.  It may include medicines to reduce pain and swelling or to relax muscles. This information is not intended to replace advice given to you by your health care provider. Make sure you discuss any questions you have with your health care provider. Document Revised: 12/18/2017 Document Reviewed: 06/03/2017 Elsevier Patient Education  [place].  Hip Exercises Ask your health care provider which exercises are safe for you. Do exercises exactly as told by your health care provider and adjust them as directed. It is normal to feel mild stretching, pulling, tightness, or discomfort as you do these exercises. Stop right away if you feel sudden pain or your pain gets worse. Do not begin these exercises until told by your health care provider. Stretching and range-of-motion exercises These exercises warm up your muscles and joints and improve the movement and flexibility of your hip. These exercises also help to relieve pain, numbness, and tingling. You may be asked to limit your range of motion if you had a hip replacement.  Talk to your health care provider about these restrictions. Hamstrings, supine  1. Lie on your back (supine position). 2. Loop a belt or towel over the ball of your left / right foot. The ball of your foot is on the walking surface, right under your toes. 3. Straighten your left / right knee and slowly pull on the belt or towel to raise your leg until you feel a gentle stretch behind your knee (hamstring). ? Do not let your knee bend while you do this. ? Keep your other leg flat on the floor. 4. Hold this position for __________ seconds. 5. Slowly return your leg to the starting position. Repeat __________ times. Complete this exercise __________ times a day. Hip rotation  1. Lie on your back on a firm surface. 2. With your left / right hand, gently pull your left / right knee toward the shoulder that is on the same side of the body. Stop when your knee is pointing toward the  ceiling. 3. Hold your left / right ankle with your other hand. 4. Keeping your knee steady, gently pull your left / right ankle toward your other shoulder until you feel a stretch in your buttocks. ? Keep your hips and shoulders firmly planted while you do this stretch. 5. Hold this position for __________ seconds. Repeat __________ times. Complete this exercise __________ times a day. Seated stretch This exercise is sometimes called hamstrings and adductors stretch. 1. Sit on the floor with your legs stretched wide. Keep your knees straight during this exercise. 2. Keeping your head and back in a straight line, bend at your waist to reach for your left foot (position A). You should feel a stretch in your right inner thigh (adductors). 3. Hold this position for __________ seconds. Then slowly return to the upright position. 4. Keeping your head and back in a straight line, bend at your waist to reach forward (position B). You should feel a stretch behind both of your thighs and knees (hamstrings). 5. Hold this position for __________ seconds. Then slowly return to the upright position. 6. Keeping your head and back in a straight line, bend at your waist to reach for your right foot (position C). You should feel a stretch in your left inner thigh (adductors). 7. Hold this position for __________ seconds. Then slowly return to the upright position. Repeat __________ times. Complete this exercise __________ times a day. Lunge This exercise stretches the muscles of the hip (hip flexors). 1. Place your left / right knee on the floor and bend your other knee so that is directly over your ankle. You should be half-kneeling. 2. Keep good posture with your head over your shoulders. 3. Tighten your buttocks to point your tailbone downward. This will prevent your back from arching too much. 4. You should feel a gentle stretch in the front of your left / right thigh and hip. If you do not feel a stretch,  slide your other foot forward slightly and then slowly lunge forward with your chest up until your knee once again lines up over your ankle. ? Make sure your tailbone continues to point downward. 5. Hold this position for __________ seconds. 6. Slowly return to the starting position. Repeat __________ times. Complete this exercise __________ times a day. Strengthening exercises These exercises build strength and endurance in your hip. Endurance is the ability to use your muscles for a long time, even after they get tired. Bridge This exercise strengthens the muscles of your hip (  hip extensors). 1. Lie on your back on a firm surface with your knees bent and your feet flat on the floor. 2. Tighten your buttocks muscles and lift your bottom off the floor until the trunk of your body and your hips are level with your thighs. ? Do not arch your back. ? You should feel the muscles working in your buttocks and the back of your thighs. If you do not feel these muscles, slide your feet 1-2 inches (2.5-5 cm) farther away from your buttocks. 3. Hold this position for __________ seconds. 4. Slowly lower your hips to the starting position. 5. Let your muscles relax completely between repetitions. Repeat __________ times. Complete this exercise __________ times a day. Straight leg raises, side-lying This exercise strengthens the muscles that move the hip joint away from the center of the body (hip abductors). 1. Lie on your side with your left / right leg in the top position. Lie so your head, shoulder, hip, and knee line up. You may bend your bottom knee slightly to help you balance. 2. Roll your hips slightly forward, so your hips are stacked directly over each other and your left / right knee is facing forward. 3. Leading with your heel, lift your top leg 4-6 inches (10-15 cm). You should feel the muscles in your top hip lifting. ? Do not let your foot drift forward. ? Do not let your knee roll toward  the ceiling. 4. Hold this position for __________ seconds. 5. Slowly return to the starting position. 6. Let your muscles relax completely between repetitions. Repeat __________ times. Complete this exercise __________ times a day. Straight leg raises, side-lying This exercise strengthens the muscles that move the hip joint toward the center of the body (hip adductors). 1. Lie on your side with your left / right leg in the bottom position. Lie so your head, shoulder, hip, and knee line up. You may place your upper foot in front to help you balance. 2. Roll your hips slightly forward, so your hips are stacked directly over each other and your left / right knee is facing forward. 3. Tense the muscles in your inner thigh and lift your bottom leg 4-6 inches (10-15 cm). 4. Hold this position for __________ seconds. 5. Slowly return to the starting position. 6. Let your muscles relax completely between repetitions. Repeat __________ times. Complete this exercise __________ times a day. Straight leg raises, supine This exercise strengthens the muscles in the front of your thigh (quadriceps). 1. Lie on your back (supine position) with your left / right leg extended and your other knee bent. 2. Tense the muscles in the front of your left / right thigh. You should see your kneecap slide up or see increased dimpling just above your knee. 3. Keep these muscles tight as you raise your leg 4-6 inches (10-15 cm) off the floor. Do not let your knee bend. 4. Hold this position for __________ seconds. 5. Keep these muscles tense as you lower your leg. 6. Relax the muscles slowly and completely between repetitions. Repeat __________ times. Complete this exercise __________ times a day. Hip abductors, standing This exercise strengthens the muscles that move the leg and hip joint away from the center of the body (hip abductors). 1. Tie one end of a rubber exercise band or tubing to a secure surface, such as a  chair, table, or pole. 2. Loop the other end of the band or tubing around your left / right ankle. 3. Keeping your  ankle with the band or tubing directly opposite the secured end, step away until there is tension in the tubing or band. Hold on to a chair, table, or pole as needed for balance. 4. Lift your left / right leg out to your side. While you do this: ? Keep your back upright. ? Keep your shoulders over your hips. ? Keep your toes pointing forward. ? Make sure to use your hip muscles to slowly lift your leg. Do not tip your body or forcefully lift your leg. 5. Hold this position for __________ seconds. 6. Slowly return to the starting position. Repeat __________ times. Complete this exercise __________ times a day. Squats This exercise strengthens the muscles in the front of your thigh (quadriceps). 1. Stand in a door frame so your feet and knees are in line with the frame. You may place your hands on the frame for balance. 2. Slowly bend your knees and lower your hips like you are going to sit in a chair. ? Keep your lower legs in a straight-up-and-down position. ? Do not let your hips go lower than your knees. ? Do not bend your knees lower than told by your health care provider. ? If your hip pain increases, do not bend as low. 3. Hold this position for ___________ seconds. 4. Slowly push with your legs to return to standing. Do not use your hands to pull yourself to standing. Repeat __________ times. Complete this exercise __________ times a day. This information is not intended to replace advice given to you by your health care provider. Make sure you discuss any questions you have with your health care provider. Document Revised: 11/27/2018 Document Reviewed: 03/04/2018 Elsevier Patient Education  [place].

## 2020-02-19 NOTE — Progress Notes (Signed)
BP 139/81   Pulse 69   Temp (!) 97.4 F (36.3 C) (Oral)   Resp 16   Wt 208 lb 6.4 oz (94.5 kg)   LMP 05/07/2009   BMI 34.83 kg/m    Subjective:    Patient ID: Vanessa Harvey, female    DOB: [birth year], 59 y.o.   MRN: [id number]  HPI: Vanessa Harvey is a 59 y.o. female  Chief Complaint  Patient presents with  . Hypertension   HYPERTENSION Hypertension status: controlled  Satisfied with current treatment? yes Duration of hypertension: chronic BP monitoring frequency:  not checking BP medication side effects:  no Medication compliance: excellent compliance Previous BP meds: lisinopril Aspirin: yes Recurrent headaches: no Visual changes: no Palpitations: no Dyspnea: no Chest pain: no Lower extremity edema: no Dizzy/lightheaded: no   BACK PAIN- just in the AM then eases up as the day goes on Duration: 2 months Mechanism of injury: no trauma Location: Low back to the R of her spine Onset: gradual Severity: moderate Quality: dull aching Frequency: intermittent Radiation: none Aggravating factors: laying in bed Alleviating factors: moving around Status: fluctuating Treatments attempted: none  Relief with NSAIDs?: No NSAIDs Taken Nighttime pain:  yes Paresthesias / decreased sensation:  no Bowel / bladder incontinence:  no Fevers:  no Dysuria / urinary frequency:  no  DEPRESSION Mood status: controlled Satisfied with current treatment?: yes Symptom severity: mild  Duration of current treatment : chronic Side effects: no Medication compliance: excellent compliance Psychotherapy/counseling: no  Previous psychiatric medications: fluoxetine Depressed mood: no Anxious mood: no Anhedonia: no Significant weight loss or gain: no Insomnia: no  Fatigue: no Feelings of worthlessness or guilt: no Impaired concentration/indecisiveness: no Suicidal ideations: no Hopelessness: no Crying spells: no Depression screen [facility] 2/9 06/26/2019 01/21/2019 12/31/2017  12/31/2017 10/25/2017  Decreased Interest 0 0 0 0 0  Down, Depressed, Hopeless 0 0 0 0 0  PHQ - 2 Score 0 0 0 0 0  Altered sleeping 2 0 0 - 0  Tired, decreased energy 1 0 0 - 0  Change in appetite 2 0 0 - 0  Feeling bad or failure about yourself  0 0 0 - 0  Trouble concentrating 0 0 0 - 0  Moving slowly or fidgety/restless 0 0 0 - 0  Suicidal thoughts 0 0 0 - 0  PHQ-9 Score 5 0 0 - 0  Difficult doing work/chores Not difficult at all Not difficult at all Not difficult at all - Not difficult at all     Relevant past medical, surgical, family and social history reviewed and updated as indicated. Interim medical history since our last visit reviewed. Allergies and medications reviewed and updated.  Review of Systems  Constitutional: Negative.   Respiratory: Negative.   Cardiovascular: Negative.   Musculoskeletal: Positive for back pain and myalgias. Negative for arthralgias, gait problem, joint swelling, neck pain and neck stiffness.  Skin: Negative.   Neurological: Negative.   Psychiatric/Behavioral: Negative.     Per HPI unless specifically indicated above     Objective:    BP 139/81   Pulse 69   Temp (!) 97.4 F (36.3 C) (Oral)   Resp 16   Wt 208 lb 6.4 oz (94.5 kg)   LMP 05/07/2009   BMI 34.83 kg/m   Wt Readings from Last 3 Encounters:  02/19/20 208 lb 6.4 oz (94.5 kg)  08/14/19 228 lb 8 oz (103.6 kg)  06/26/19 226 lb 6.4 oz (102.7 kg)    Physical Exam  Vitals and nursing note reviewed.  Constitutional:      General: She is not in acute distress.    Appearance: Normal appearance. She is not ill-appearing, toxic-appearing or diaphoretic.  HENT:     Head: Normocephalic and atraumatic.     Right Ear: External ear normal.     Left Ear: External ear normal.     Nose: Nose normal.     Mouth/Throat:     Mouth: Mucous membranes are moist.     Pharynx: Oropharynx is clear.  Eyes:     General: No scleral icterus.       Right eye: No discharge.        Left eye: No  discharge.     Extraocular Movements: Extraocular movements intact.     Conjunctiva/sclera: Conjunctivae normal.     Pupils: Pupils are equal, round, and reactive to light.  Cardiovascular:     Rate and Rhythm: Normal rate and regular rhythm.     Pulses: Normal pulses.     Heart sounds: Normal heart sounds. No murmur heard.  No friction rub. No gallop.   Pulmonary:     Effort: Pulmonary effort is normal. No respiratory distress.     Breath sounds: Normal breath sounds. No stridor. No wheezing, rhonchi or rales.  Chest:     Chest wall: No tenderness.  Musculoskeletal:        General: Normal range of motion.     Cervical back: Normal range of motion and neck supple.  Skin:    General: Skin is warm and dry.     Capillary Refill: Capillary refill takes less than 2 seconds.     Coloration: Skin is not jaundiced or pale.     Findings: No bruising, erythema, lesion or rash.  Neurological:     General: No focal deficit present.     Mental Status: She is alert and oriented to person, place, and time. Mental status is at baseline.  Psychiatric:        Mood and Affect: Mood normal.        Behavior: Behavior normal.        Thought Content: Thought content normal.        Judgment: Judgment normal.     Results for orders placed or performed in visit on 78/93/81  Basic metabolic panel  Result Value Ref Range   Glucose 95 65 - 99 mg/dL   BUN 14 8 - 27 mg/dL   Creatinine, Ser 0.74 0.57 - 1.00 mg/dL   GFR calc non Af Amer 88 >59 mL/min/1.73   GFR calc Af Amer 101 >59 mL/min/1.73   BUN/Creatinine Ratio 19 12 - 28   Sodium 143 134 - 144 mmol/L   Potassium 5.1 3.5 - 5.2 mmol/L   Chloride 104 96 - 106 mmol/L   CO2 25 20 - 29 mmol/L   Calcium 9.6 8.7 - 10.3 mg/dL      Assessment & Plan:   Problem List Items Addressed This Visit      Respiratory   Asthma, mild intermittent - Primary    Under good control on current regimen. Continue current regimen. Continue to monitor. Call with any  concerns. Refills given. Labs drawn today.        Relevant Medications   montelukast (SINGULAIR) 10 MG tablet   albuterol (PROVENTIL HFA) 108 (90 Base) MCG/ACT inhaler     Genitourinary   Benign hypertensive renal disease    Under good control on current regimen. Continue current regimen. Continue  to monitor. Call with any concerns. Refills given. Labs drawn today.        Relevant Orders   Basic metabolic panel (Completed)     Other   Major depression, recurrent, full remission ([place])    Under good control on current regimen. Continue current regimen. Continue to monitor. Call with any concerns. Refills given. Labs drawn today.        Relevant Medications   FLUoxetine (PROZAC) 20 MG capsule   BACK PAIN, LUMBAR    Likely due to mattress. Stretches, naproxen and flexeril given today. Call with any concerns or if not getting better.           Follow up plan: Return in about 6 months (around 08/19/2020) for physical.

## 2020-02-20 LAB — BASIC METABOLIC PANEL
BUN/Creatinine Ratio: 19 (ref 12–28)
BUN: 14 mg/dL (ref 8–27)
CO2: 25 mmol/L (ref 20–29)
Calcium: 9.6 mg/dL (ref 8.7–10.3)
Chloride: 104 mmol/L (ref 96–106)
Creatinine, Ser: 0.74 mg/dL (ref 0.57–1.00)
GFR calc Af Amer: 101 mL/min/{1.73_m2} (ref >59)
GFR calc non Af Amer: 88 mL/min/{1.73_m2} (ref >59)
Glucose: 95 mg/dL (ref 65–99)
Potassium: 5.1 mmol/L (ref 3.5–5.2)
Sodium: 143 mmol/L (ref 134–144)

## 2020-02-21 ENCOUNTER — Encounter: Payer: Self-pay | Admitting: Family Medicine

## 2020-02-21 NOTE — Assessment & Plan Note (Signed)
Under good control on current regimen. Continue current regimen. Continue to monitor. Call with any concerns. Refills given. Labs drawn today.   

## 2020-02-21 NOTE — Assessment & Plan Note (Signed)
Likely due to mattress. Stretches, naproxen and flexeril given today. Call with any concerns or if not getting better.

## 2020-02-23 ENCOUNTER — Ambulatory Visit: Payer: Self-pay | Admitting: Family Medicine

## 2020-02-23 NOTE — Telephone Encounter (Signed)
Pt. Called and c/o severe right lower back pain.  Reported she has had some discomfort in same site, when first waking up in the mornings, for a couple weeks.  Reported she moved boxes and furniture on Friday, and the pain progressively worsened.  Stated she has been in severe pain since 4:00 AM; rates pain at 9/10.  Has been alternating Acetaminophen and Ibuprofen.  Is taking muscle relaxants prn, and icing the area.  Cannot get comfortable.  Cannot sit.  Only temp. Relief when she lays flat and lift right leg and crosses it over left leg.  Pain radiates into right hip and intermittently down the right leg.  Denied numbness or tingling.  Called office to check on poss. Work in to see provider.  No available appts. Today.  Advised pt. She will need to go to Urgent Care or the ER for evaluation. Pt. Stated she will think about it.  Advised to have another adult drive her.  Verb. Understanding.      Reason for Disposition  [1] SEVERE back pain (e.g., excruciating, unable to do any normal activities) AND [2] not improved 2 hours after pain medicine  Answer Assessment - Initial Assessment Questions 1. ONSET: "When did the pain begin?"      Started about 2 weeks ago; intermittent 2. LOCATION: "Where does it hurt?" (upper, mid or lower back)     Right lower back 3. SEVERITY: "How bad is the pain?"  (e.g., Scale 1-10; mild, moderate, or severe)   - MILD (1-3): doesn't interfere with normal activities    - MODERATE (4-7): interferes with normal activities or awakens from sleep    - SEVERE (8-10): excruciating pain, unable to do any normal activities      9/10 4. PATTERN: "Is the pain constant?" (e.g., yes, no; constant, intermittent)      Constant- varies in intensity 5. RADIATION: "Does the pain shoot into your legs or elsewhere?"     Radiates across right hip and at times radiates down the right leg 6. CAUSE:  "What do you think is causing the back pain?"      Moved boxes on Fri.; made it worse 7.  BACK OVERUSE:  "Any recent lifting of heavy objects, strenuous work or exercise?"    Recently moved boxes and furniture (Fri.) 8. MEDICATIONS: "What have you taken so far for the pain?" (e.g., nothing, acetaminophen, NSAIDS)     Using acetaminophen and Ibuprofen 9. NEUROLOGIC SYMPTOMS: "Do you have any weakness, numbness, or problems with bowel/bladder control?"     Denied  10. OTHER SYMPTOMS: "Do you have any other symptoms?" (e.g., fever, abdominal pain, burning with urination, blood in urine)       Denied any numbness or tingling 11. PREGNANCY: "Is there any chance you are pregnant?" (e.g., yes, no; LMP)       N/a  Protocols used: BACK PAIN-A-AH

## 2020-02-24 ENCOUNTER — Ambulatory Visit (INDEPENDENT_AMBULATORY_CARE_PROVIDER_SITE_OTHER): Payer: 59 | Admitting: Family Medicine

## 2020-02-24 ENCOUNTER — Other Ambulatory Visit: Payer: Self-pay

## 2020-02-24 ENCOUNTER — Encounter: Payer: Self-pay | Admitting: Family Medicine

## 2020-02-24 ENCOUNTER — Ambulatory Visit
Admission: RE | Admit: 2020-02-24 | Discharge: 2020-02-24 | Disposition: A | Discharge status: Home / Self Care | Payer: 59 | Source: Ambulatory Visit | Attending: Family Medicine | Admitting: Family Medicine

## 2020-02-24 ENCOUNTER — Ambulatory Visit
Admission: RE | Admit: 2020-02-24 | Discharge: 2020-02-24 | Disposition: A | Discharge status: Home / Self Care | Payer: 59 | Attending: Family Medicine | Admitting: Family Medicine

## 2020-02-24 VITALS — BP 123/76 | HR 90 | Temp 98.2°F | Ht 65.0 in | Wt 208.0 lb

## 2020-02-24 DIAGNOSIS — M545 Low back pain, unspecified: Secondary | ICD-10-CM

## 2020-02-24 MED ORDER — BACLOFEN 10 MG PO TABS: 10.0000 mg | ORAL_TABLET | Freq: Three times a day (TID) | ORAL | 1 refills | Status: DC

## 2020-02-24 MED ORDER — KETOROLAC TROMETHAMINE 60 MG/2ML IM SOLN
60.0000 mg | Freq: Once | INTRAMUSCULAR | Status: AC
  Administered 2020-02-24: 60 mg via INTRAMUSCULAR

## 2020-02-24 MED ORDER — PREDNISONE 10 MG PO TABS: ORAL_TABLET | ORAL | 0 refills | Status: DC

## 2020-02-24 NOTE — Patient Instructions (Signed)
Piriformis Syndrome Rehab Ask your health care provider which exercises are safe for you. Do exercises exactly as told by your health care provider and adjust them as directed. It is normal to feel mild stretching, pulling, tightness, or discomfort as you do these exercises. Stop right away if you feel sudden pain or your pain gets worse. Do not begin these exercises until told by your health care provider. Stretching and range-of-motion exercises These exercises warm up your muscles and joints and improve the movement and flexibility of your hip and pelvis. The exercises also help to relieve pain, numbness, and tingling. Hip rotation This is an exercise in which you lie on your back and stretch the muscles that rotate your hip (hip rotators) to stretch your buttocks. 1. Lie on your back on a firm surface. 2. Pull your left / right knee toward your same shoulder with your left / right hand until your knee is pointing toward the ceiling. Hold your left / right ankle with your other hand. 3. Keeping your knee steady, gently pull your left / right ankle toward your other shoulder until you feel a stretch in your buttocks. 4. Hold this position for __________ seconds. Repeat __________ times. Complete this exercise __________ times a day. Hip extensor This is an exercise in which you lie on your back and pull your knee to your chest. 1. Lie on your back on a firm surface. Both of your legs should be straight. 2. Pull your left / right knee to your chest. Hold your leg in this position by holding onto the back of your thigh or the front of your knee. 3. Hold this position for __________ seconds. 4. Slowly return to the starting position. Repeat __________ times. Complete this exercise __________ times a day. Strengthening exercises These exercises build strength and endurance in your hip and thigh muscles. Endurance is the ability to use your muscles for a long time, even after they get  tired. Straight leg raises, side-lying This exercise strengthens the muscles that rotate the leg at the hip and move it away from your body (hip abductors). 1. Lie on your side with your left / right leg in the top position. Lie so your head, shoulder, knee, and hip line up. Bend your bottom knee to help you balance. 2. Lift your top leg 4-6 inches (10-15 cm) while keeping your toes pointed straight ahead. 3. Hold this position for __________ seconds. 4. Slowly lower your leg to the starting position. 5. Let your muscles relax completely after each repetition. Repeat __________ times. Complete this exercise __________ times a day. Hip abduction and rotation This is sometimes called quadruped (on hands and knees) exercises. 1. Get on your hands and knees on a firm, lightly padded surface. Your hands should be directly below your shoulders, and your knees should be directly below your hips. 2. Lift your left / right knee out to the side. Keep your knee bent. Do not twist your body. 3. Hold this position for __________ seconds. 4. Slowly lower your leg. Repeat __________ times. Complete this exercise __________ times a day. Straight leg raises, face-down This exercise stretches the muscles that move your hips away from the front of the pelvis (hip extensors). 1. Lie on your abdomen on a bed or a firm surface with a pillow under your hips. 2. Squeeze your buttocks muscles and lift your left / right leg about 4-6 inches (10-15 cm) off the bed. Do not let your back arch. 3. Hold  this position for __________ seconds. 4. Slowly lower your leg to the starting position. 5. Let your muscles relax completely after each repetition. Repeat __________ times. Complete this exercise __________ times a day. This information is not intended to replace advice given to you by your health care provider. Make sure you discuss any questions you have with your health care provider. Document Revised: 08/14/2018  Document Reviewed: 02/13/2018 Elsevier Patient Education  [employer].

## 2020-02-24 NOTE — Assessment & Plan Note (Signed)
Worse. No better with flexeril and ibuprofen. Will change to prednisone and baclofen. Will obtain x-ray. Will give toradol shot today. Stretches given. Recheck 1 week.

## 2020-02-24 NOTE — Progress Notes (Signed)
BP 123/76 (BP Location: Left Arm, Patient Position: Sitting)   Pulse 90   Temp 98.2 F (36.8 C) (Oral)   Ht 5\' 5"  (1.651 m)   Wt 208 lb (94.3 kg)   LMP 05/07/2009   SpO2 98%   BMI 34.61 kg/m    Subjective:    Patient ID: Vanessa Harvey, female    DOB: March 28, 1961, 59 y.o.   MRN: [id number]  HPI: FLOY ANGERT is a 59 y.o. female  Chief Complaint  Patient presents with  . Back Pain    X6 days, moving furniture and boxes, lower back right side, constant pain, pain shuts across right hip and down leg sometimes   BACK PAIN Duration: 6 days Mechanism of injury: lifting Location: Right and low back Onset: sudden Severity: severe Quality: sharp, aching Frequency: constant Radiation: R leg below the knee Aggravating factors: moving and lifting Alleviating factors: nothing, leaning forward Status: worse Treatments attempted:ibuprofen, flexeril   Relief with NSAIDs?: no Nighttime pain:  yes Paresthesias / decreased sensation:  no Bowel / bladder incontinence:  no Fevers:  no Dysuria / urinary frequency:  no  Relevant past medical, surgical, family and social history reviewed and updated as indicated. Interim medical history since our last visit reviewed. Allergies and medications reviewed and updated.  Review of Systems  Constitutional: Negative.   Respiratory: Negative.   Cardiovascular: Negative.   Musculoskeletal: Positive for back pain and myalgias. Negative for arthralgias, gait problem, joint swelling, neck pain and neck stiffness.  Neurological: Negative.   Psychiatric/Behavioral: Negative.     Per HPI unless specifically indicated above     Objective:    BP 123/76 (BP Location: Left Arm, Patient Position: Sitting)   Pulse 90   Temp 98.2 F (36.8 C) (Oral)   Ht 5\' 5"  (1.651 m)   Wt 208 lb (94.3 kg)   LMP 05/07/2009   SpO2 98%   BMI 34.61 kg/m   Wt Readings from Last 3 Encounters:  02/24/20 208 lb (94.3 kg)  02/19/20 208 lb 6.4 oz (94.5 kg)    08/14/19 228 lb 8 oz (103.6 kg)    Physical Exam Vitals and nursing note reviewed.  Constitutional:      General: She is in acute distress.     Appearance: Normal appearance. She is not ill-appearing, toxic-appearing or diaphoretic.  HENT:     Head: Normocephalic and atraumatic.     Right Ear: External ear normal.     Left Ear: External ear normal.     Nose: Nose normal.     Mouth/Throat:     Mouth: Mucous membranes are moist.     Pharynx: Oropharynx is clear.  Eyes:     General: No scleral icterus.       Right eye: No discharge.        Left eye: No discharge.     Extraocular Movements: Extraocular movements intact.     Conjunctiva/sclera: Conjunctivae normal.     Pupils: Pupils are equal, round, and reactive to light.  Cardiovascular:     Rate and Rhythm: Normal rate and regular rhythm.     Pulses: Normal pulses.     Heart sounds: Normal heart sounds. No murmur heard.  No friction rub. No gallop.   Pulmonary:     Effort: Pulmonary effort is normal. No respiratory distress.     Breath sounds: Normal breath sounds. No stridor. No wheezing, rhonchi or rales.  Chest:     Chest wall: No tenderness.  Musculoskeletal:  General: Normal range of motion.     Cervical back: Normal range of motion and neck supple.     Comments: Hypertonic lumbar muscles on the R with hypertonic piriformis on the R  Skin:    General: Skin is warm and dry.     Capillary Refill: Capillary refill takes less than 2 seconds.     Coloration: Skin is not jaundiced or pale.     Findings: No bruising, erythema, lesion or rash.  Neurological:     General: No focal deficit present.     Mental Status: She is alert and oriented to person, place, and time. Mental status is at baseline.  Psychiatric:        Mood and Affect: Mood normal.        Behavior: Behavior normal.        Thought Content: Thought content normal.        Judgment: Judgment normal.     Results for orders placed or performed in  visit on 62/56/38  Basic metabolic panel  Result Value Ref Range   Glucose 95 65 - 99 mg/dL   BUN 14 8 - 27 mg/dL   Creatinine, Ser 0.74 0.57 - 1.00 mg/dL   GFR calc non Af Amer 88 >59 mL/min/1.73   GFR calc Af Amer 101 >59 mL/min/1.73   BUN/Creatinine Ratio 19 12 - 28   Sodium 143 134 - 144 mmol/L   Potassium 5.1 3.5 - 5.2 mmol/L   Chloride 104 96 - 106 mmol/L   CO2 25 20 - 29 mmol/L   Calcium 9.6 8.7 - 10.3 mg/dL      Assessment & Plan:   Problem List Items Addressed This Visit      Other   BACK PAIN, LUMBAR - Primary    Worse. No better with flexeril and ibuprofen. Will change to prednisone and baclofen. Will obtain x-ray. Will give toradol shot today. Stretches given. Recheck 1 week.      Relevant Medications   baclofen (LIORESAL) 10 MG tablet   ketorolac (TORADOL) injection 60 mg   predniSONE (DELTASONE) 10 MG tablet   Other Relevant Orders   DG Lumbar Spine Complete       Follow up plan: Return in about 1 week (around 03/02/2020).

## 2020-02-24 NOTE — Telephone Encounter (Signed)
Seen today. 

## 2020-03-04 ENCOUNTER — Ambulatory Visit: Payer: 59 | Admitting: Family Medicine

## 2020-03-04 ENCOUNTER — Other Ambulatory Visit: Payer: Self-pay

## 2020-03-04 ENCOUNTER — Encounter: Payer: Self-pay | Admitting: Family Medicine

## 2020-03-04 VITALS — BP 130/75 | HR 87 | Temp 98.5°F | Wt 209.8 lb

## 2020-03-04 DIAGNOSIS — M5441 Lumbago with sciatica, right side: Secondary | ICD-10-CM | POA: Diagnosis not present

## 2020-03-04 NOTE — Assessment & Plan Note (Signed)
Will get her in for PT. Continue massage and chiropractry. Continue muscle relaxer. Recheck 3 weeks. If not better, will get MRI.

## 2020-03-04 NOTE — Progress Notes (Signed)
BP 130/75   Pulse 87   Temp 98.5 F (36.9 C) (Oral)   Wt 209 lb 12.8 oz (95.2 kg)   LMP 05/07/2009   SpO2 96%   BMI 34.91 kg/m    Subjective:    Patient ID: Vanessa Harvey, female    DOB: 11/04/1960, 59 y.o.   MRN: [id number]  HPI: Vanessa Harvey is a 59 y.o. female  Chief Complaint  Patient presents with  . Back Pain    1 week f/up- pt states her back pain is better than it was. States she has been to chiro twice and to a massage therapist since visit last week    BACK PAIN Duration: about 2 weeks Mechanism of injury: lifting Location: R low back and leg Onset: sudden Severity: moderate Quality: sharp and aching Frequency: constant Radiation: R leg below the knee Aggravating factors: moving, lifting Alleviating factors: prednisone, baclofen, chiropractry, massage Status: better Treatments attempted: chiropractry, massage, rest, ice, heat, APAP, ibuprofen, aleve and HEP  Relief with NSAIDs?: No NSAIDs Taken Nighttime pain:  yes Paresthesias / decreased sensation:  yes Bowel / bladder incontinence:  no Fevers:  no Dysuria / urinary frequency:  no  Relevant past medical, surgical, family and social history reviewed and updated as indicated. Interim medical history since our last visit reviewed. Allergies and medications reviewed and updated.  Review of Systems  Constitutional: Negative.   Respiratory: Negative.   Cardiovascular: Negative.   Gastrointestinal: Negative.   Musculoskeletal: Positive for back pain, gait problem and myalgias. Negative for arthralgias, joint swelling, neck pain and neck stiffness.  Skin: Negative.   Neurological: Positive for numbness. Negative for dizziness, tremors, seizures, syncope, facial asymmetry, speech difficulty, weakness, light-headedness and headaches.  Psychiatric/Behavioral: Negative.     Per HPI unless specifically indicated above     Objective:    BP 130/75   Pulse 87   Temp 98.5 F (36.9 C) (Oral)   Wt 209  lb 12.8 oz (95.2 kg)   LMP 05/07/2009   SpO2 96%   BMI 34.91 kg/m   Wt Readings from Last 3 Encounters:  03/04/20 209 lb 12.8 oz (95.2 kg)  02/24/20 208 lb (94.3 kg)  02/19/20 208 lb 6.4 oz (94.5 kg)    Physical Exam Vitals and nursing note reviewed.  Constitutional:      General: She is not in acute distress.    Appearance: Normal appearance. She is not ill-appearing, toxic-appearing or diaphoretic.  HENT:     Head: Normocephalic and atraumatic.     Right Ear: External ear normal.     Left Ear: External ear normal.     Nose: Nose normal.     Mouth/Throat:     Mouth: Mucous membranes are moist.     Pharynx: Oropharynx is clear.  Eyes:     General: No scleral icterus.       Right eye: No discharge.        Left eye: No discharge.     Extraocular Movements: Extraocular movements intact.     Conjunctiva/sclera: Conjunctivae normal.     Pupils: Pupils are equal, round, and reactive to light.  Cardiovascular:     Rate and Rhythm: Normal rate and regular rhythm.     Pulses: Normal pulses.     Heart sounds: Normal heart sounds. No murmur heard.  No friction rub. No gallop.   Pulmonary:     Effort: Pulmonary effort is normal. No respiratory distress.     Breath sounds: Normal breath  sounds. No stridor. No wheezing, rhonchi or rales.  Chest:     Chest wall: No tenderness.  Musculoskeletal:        General: Tenderness (and hypertonicity R lumbar) present. Normal range of motion.     Cervical back: Normal range of motion and neck supple.  Skin:    General: Skin is warm and dry.     Capillary Refill: Capillary refill takes less than 2 seconds.     Coloration: Skin is not jaundiced or pale.     Findings: No bruising, erythema, lesion or rash.  Neurological:     General: No focal deficit present.     Mental Status: She is alert and oriented to person, place, and time. Mental status is at baseline.  Psychiatric:        Mood and Affect: Mood normal.        Behavior: Behavior  normal.        Thought Content: Thought content normal.        Judgment: Judgment normal.     Results for orders placed or performed in visit on 17/40/81  Basic metabolic panel  Result Value Ref Range   Glucose 95 65 - 99 mg/dL   BUN 14 8 - 27 mg/dL   Creatinine, Ser 0.74 0.57 - 1.00 mg/dL   GFR calc non Af Amer 88 >59 mL/min/1.73   GFR calc Af Amer 101 >59 mL/min/1.73   BUN/Creatinine Ratio 19 12 - 28   Sodium 143 134 - 144 mmol/L   Potassium 5.1 3.5 - 5.2 mmol/L   Chloride 104 96 - 106 mmol/L   CO2 25 20 - 29 mmol/L   Calcium 9.6 8.7 - 10.3 mg/dL      Assessment & Plan:   Problem List Items Addressed This Visit      Other   BACK PAIN, LUMBAR - Primary    Will get her in for PT. Continue massage and chiropractry. Continue muscle relaxer. Recheck 3 weeks. If not better, will get MRI.      Relevant Orders   Ambulatory referral to Physical Therapy       Follow up plan: Return in about 3 weeks (around 03/25/2020).

## 2020-03-25 ENCOUNTER — Ambulatory Visit: Payer: 59 | Admitting: Family Medicine

## 2020-05-02 ENCOUNTER — Other Ambulatory Visit: Payer: Self-pay | Admitting: Family Medicine

## 2020-05-02 NOTE — Telephone Encounter (Signed)
Requested Prescriptions  Pending Prescriptions Disp Refills   FLUoxetine (PROZAC) 20 MG capsule [Pharmacy Med Name: FLUOXETINE 20MG  CAPSULES] 90 capsule 0    Sig: TAKE 1 CAPSULE(20 MG) BY MOUTH DAILY     Psychiatry:  Antidepressants - SSRI Passed - 05/02/2020  2:43 PM      Passed - Completed PHQ-2 or PHQ-9 in the last 360 days      Passed - Valid encounter within last 6 months    Recent Outpatient Visits          1 month ago Acute right-sided low back pain with right-sided sciatica   [facility] [place], Megan P, DO   2 months ago Acute right-sided low back pain without sciatica   [facility] [place], Megan P, DO   2 months ago Mild intermittent asthma with acute exacerbation   [facility] [place], Megan P, DO   8 months ago Benign hypertensive renal disease   [facility] [place], Megan P, DO   10 months ago Routine general medical examination at a health care facility   [facility], [place], DO      Future Appointments            In 3 months [place], Megan P, DO [facility]            montelukast (SINGULAIR) 10 MG tablet 08-12-1998 Med Name: MONTELUKAST 10MG  TABLETS] 90 tablet 1    Sig: TAKE 1 TABLET(10 MG) BY MOUTH DAILY     Pulmonology:  Leukotriene Inhibitors Passed - 05/02/2020  2:43 PM      Passed - Valid encounter within last 12 months    Recent Outpatient Visits          1 month ago Acute right-sided low back pain with right-sided sciatica   [facility] [place], Megan P, DO   2 months ago Acute right-sided low back pain without sciatica   [facility] [place], Megan P, DO   2 months ago Mild intermittent asthma with acute exacerbation   [facility] [place], Megan P, DO   8 months ago Benign hypertensive renal disease   [facility] [place], Megan P, DO   10 months ago Routine general medical examination at a health care  facility   [facility] [place], Gamaliel, DO      Future Appointments            In 3 months [place], [place], DO [facility]

## 2020-05-10 ENCOUNTER — Telehealth: Payer: Self-pay

## 2020-05-10 NOTE — Telephone Encounter (Signed)
Copied from CRM [id number]. Topic: General - Call Back - No Documentation >> May 10, 2020  1:14 PM Randol Kern wrote: Reason for CRM: [phone] Leave a message, pt wants to have her mammogram and wants to know where office has recommended her to go. Please advise

## 2020-05-16 ENCOUNTER — Other Ambulatory Visit: Payer: Self-pay | Admitting: Family Medicine

## 2020-05-16 DIAGNOSIS — Z1231 Encounter for screening mammogram for malignant neoplasm of breast: Secondary | ICD-10-CM

## 2020-07-22 ENCOUNTER — Other Ambulatory Visit: Payer: Self-pay | Admitting: Family Medicine

## 2020-07-22 NOTE — Telephone Encounter (Signed)
Future visit in 4 weeks  

## 2020-08-08 ENCOUNTER — Telehealth: Payer: Self-pay

## 2020-08-08 NOTE — Telephone Encounter (Signed)
Lvm to r/s 4/15 appt

## 2020-08-12 ENCOUNTER — Other Ambulatory Visit: Payer: Self-pay | Admitting: Family Medicine

## 2020-08-19 ENCOUNTER — Encounter: Payer: 59 | Admitting: Family Medicine

## 2020-09-16 ENCOUNTER — Other Ambulatory Visit: Payer: Self-pay

## 2020-09-16 ENCOUNTER — Encounter: Payer: Self-pay | Admitting: Family Medicine

## 2020-09-16 ENCOUNTER — Ambulatory Visit (INDEPENDENT_AMBULATORY_CARE_PROVIDER_SITE_OTHER): Payer: 59 | Admitting: Family Medicine

## 2020-09-16 VITALS — BP 127/66 | HR 74 | Temp 98.2°F | Ht 65.0 in | Wt 212.0 lb

## 2020-09-16 DIAGNOSIS — Z0000 Encounter for general adult medical examination without abnormal findings: Secondary | ICD-10-CM | POA: Diagnosis not present

## 2020-09-16 DIAGNOSIS — Z1211 Encounter for screening for malignant neoplasm of colon: Secondary | ICD-10-CM

## 2020-09-16 DIAGNOSIS — I129 Hypertensive chronic kidney disease with stage 1 through stage 4 chronic kidney disease, or unspecified chronic kidney disease: Secondary | ICD-10-CM | POA: Diagnosis not present

## 2020-09-16 DIAGNOSIS — E538 Deficiency of other specified B group vitamins: Secondary | ICD-10-CM

## 2020-09-16 DIAGNOSIS — Z13220 Encounter for screening for lipoid disorders: Secondary | ICD-10-CM | POA: Diagnosis not present

## 2020-09-16 DIAGNOSIS — R1011 Right upper quadrant pain: Secondary | ICD-10-CM

## 2020-09-16 DIAGNOSIS — K219 Gastro-esophageal reflux disease without esophagitis: Secondary | ICD-10-CM

## 2020-09-16 DIAGNOSIS — J4521 Mild intermittent asthma with (acute) exacerbation: Secondary | ICD-10-CM

## 2020-09-16 DIAGNOSIS — F3342 Major depressive disorder, recurrent, in full remission: Secondary | ICD-10-CM

## 2020-09-16 DIAGNOSIS — N182 Chronic kidney disease, stage 2 (mild): Secondary | ICD-10-CM

## 2020-09-16 DIAGNOSIS — N6321 Unspecified lump in the left breast, upper outer quadrant: Secondary | ICD-10-CM

## 2020-09-16 LAB — URINALYSIS, ROUTINE W REFLEX MICROSCOPIC
Bilirubin, UA: NEGATIVE
Glucose, UA: NEGATIVE
Ketones, UA: NEGATIVE
Leukocytes,UA: NEGATIVE
Nitrite, UA: NEGATIVE
Protein,UA: NEGATIVE
Specific Gravity, UA: <1.005 — ABNORMAL LOW (ref 1.005–1.030)
Urobilinogen, Ur: 0.2 mg/dL (ref 0.2–1.0)
pH, UA: 6.5 (ref 5.0–7.5)

## 2020-09-16 LAB — MICROALBUMIN, URINE WAIVED
Creatinine, Urine Waived: 50 mg/dL (ref 10–300)
Microalb, Ur Waived: 10 mg/L (ref 0–19)
Microalb/Creat Ratio: <30 mg/g (ref <30)

## 2020-09-16 LAB — MICROSCOPIC EXAMINATION
Bacteria, UA: NONE SEEN
WBC, UA: NONE SEEN /hpf (ref 0–5)

## 2020-09-16 MED ORDER — DOXYCYCLINE HYCLATE 100 MG PO TABS: 100.0000 mg | ORAL_TABLET | Freq: Two times a day (BID) | ORAL | 0 refills | Status: DC

## 2020-09-16 MED ORDER — FLUOXETINE HCL 20 MG PO CAPS: ORAL_CAPSULE | ORAL | 1 refills | Status: DC

## 2020-09-16 MED ORDER — FLUTICASONE PROPIONATE 50 MCG/ACT NA SUSP: 2.0000 | Freq: Every day | NASAL | 11 refills | Status: DC

## 2020-09-16 MED ORDER — BACLOFEN 10 MG PO TABS: 10.0000 mg | ORAL_TABLET | Freq: Three times a day (TID) | ORAL | 1 refills | Status: DC

## 2020-09-16 MED ORDER — ALBUTEROL SULFATE HFA 108 (90 BASE) MCG/ACT IN AERS: 2.0000 | INHALATION_SPRAY | RESPIRATORY_TRACT | 6 refills | Status: DC | PRN

## 2020-09-16 MED ORDER — VITAMIN B-12 1000 MCG SL SUBL: 1000.0000 ug | SUBLINGUAL_TABLET | Freq: Every day | SUBLINGUAL | 11 refills | Status: AC

## 2020-09-16 MED ORDER — LISINOPRIL 10 MG PO TABS: ORAL_TABLET | ORAL | 1 refills | Status: DC

## 2020-09-16 NOTE — Assessment & Plan Note (Signed)
Under good control on current regimen. Continue current regimen. Continue to monitor. Call with any concerns. Refills given. Labs drawn today.   

## 2020-09-16 NOTE — Assessment & Plan Note (Signed)
Under good control on current regimen. Continue current regimen. Continue to monitor. Call with any concerns. Refills given.   

## 2020-09-16 NOTE — Assessment & Plan Note (Signed)
Rechecking labs checked today. Await results.

## 2020-09-16 NOTE — Progress Notes (Signed)
BP 127/66   Pulse 74   Temp 98.2 F (36.8 C)   Ht 5\' 5"  (1.651 m)   Wt 212 lb (96.2 kg)   LMP 05/07/2009   SpO2 97%   BMI 35.28 kg/m    Subjective:    Patient ID: Vanessa Harvey, female    DOB: 26-Apr-1961, 60 y.o.   MRN: [id number]  HPI: Vanessa Harvey is a 60 y.o. female presenting on 09/16/2020 for comprehensive medical examination. Current medical complaints include:  BREAST PAIN Duration: couple of days Location: left Onset: sudden Severity: moderate Quality: aching and burning Frequency: constant Redness: yes Swelling: yes Trauma: no trauma Breastfeeding: no Associated with menstral cycle: no Nipple discharge: no Breast lump: yes Status: worse Treatments attempted: none Previous mammogram: yes- overdue  Has been hurting in the RUQ of her belly, more of a dull ache. It's been going on off for >20 years, but has been acting up more in the past month.   HYPERTENSION Hypertension status: controlled  Satisfied with current treatment? yes Duration of hypertension: chronic BP monitoring frequency:  not checking BP medication side effects:  no Medication compliance: excellent compliance Previous BP meds: lisinopril Aspirin: no Recurrent headaches: no Visual changes: no Palpitations: no Dyspnea: no Chest pain: no Lower extremity edema: no Dizzy/lightheaded: no  ANXIETY/STRESS Duration:controlled Anxious mood: no  Excessive worrying: no Irritability: no  Sweating: no Nausea: no Palpitations:no Hyperventilation: no Panic attacks: no Agoraphobia: no  Obscessions/compulsions: no Depressed mood: no Depression screen [facility] 2/9 09/16/2020 02/24/2020 06/26/2019 01/21/2019 12/31/2017  Decreased Interest 0 0 0 0 0  Down, Depressed, Hopeless 0 0 0 0 0  PHQ - 2 Score 0 0 0 0 0  Altered sleeping 0 0 2 0 0  Tired, decreased energy 1 0 1 0 0  Change in appetite 1 0 2 0 0  Feeling bad or failure about yourself  0 0 0 0 0  Trouble concentrating 0 0 0 0 0  Moving  slowly or fidgety/restless 0 0 0 0 0  Suicidal thoughts 0 0 0 0 0  PHQ-9 Score 2 0 5 0 0  Difficult doing work/chores Not difficult at all Not difficult at all Not difficult at all Not difficult at all Not difficult at all   Anhedonia: no Weight changes: no Insomnia: no   Hypersomnia: no Fatigue/loss of energy: yes Feelings of worthlessness: no Feelings of guilt: no Impaired concentration/indecisiveness: no Suicidal ideations: no  Crying spells: no Recent Stressors/Life Changes: no   Relationship problems: no   Family stress: no     Financial stress: no    Job stress: no    Recent death/loss: no  Menopausal Symptoms: no  Depression Screen done today and results listed below:  Depression screen [facility] 2/9 09/16/2020 02/24/2020 06/26/2019 01/21/2019 12/31/2017  Decreased Interest 0 0 0 0 0  Down, Depressed, Hopeless 0 0 0 0 0  PHQ - 2 Score 0 0 0 0 0  Altered sleeping 0 0 2 0 0  Tired, decreased energy 1 0 1 0 0  Change in appetite 1 0 2 0 0  Feeling bad or failure about yourself  0 0 0 0 0  Trouble concentrating 0 0 0 0 0  Moving slowly or fidgety/restless 0 0 0 0 0  Suicidal thoughts 0 0 0 0 0  PHQ-9 Score 2 0 5 0 0  Difficult doing work/chores Not difficult at all Not difficult at all Not difficult at all Not difficult at all  Not difficult at all    Past Medical History:  Past Medical History:  Diagnosis Date  . Allergic rhinitis, cause unspecified   . Anxiety state 12/22/2008   Qualifier: Diagnosis of  By: Glori Bickers MD, Carmell Austria   . Anxiety state, unspecified   . Asthma   . Breast discharge 12/12/2016   right  . Degenerative disc disease, lumbar   . Depressive disorder, not elsewhere classified   . Family history of stroke 12/12/2016  . GERD (gastroesophageal reflux disease)   . Low HDL (under 40) 10/25/2017  . Lumbago   . Numbness in right leg 07/06/2015  . Other atopic dermatitis and related conditions   . Other B-complex deficiencies   . Panic disorder without  agoraphobia   . Problems with swallowing and mastication   . Right facial numbness 11/29/2015  . Submandibular lymphadenopathy 06/29/2013  . Unspecified asthma(493.90)     Surgical History:  Past Surgical History:  Procedure Laterality Date  . Abd [place]  09/2005   Negative  . CESAREAN SECTION  1993  . DEXA  06/2004  . ESOPHAGOGASTRODUODENOSCOPY (EGD) WITH PROPOFOL N/A 04/08/2017   Procedure: ESOPHAGOGASTRODUODENOSCOPY (EGD) WITH PROPOFOL;  Surgeon: [name], MD;  Location: [place];  Service: Endoscopy;  Laterality: N/A;  . KNEE ARTHROSCOPY  1984   evulsion of femur s/p fall    Medications:  Current Outpatient Medications on File Prior to Visit  Medication Sig  . esomeprazole (NEXIUM) 20 MG capsule Take 1 capsule (20 mg total) by mouth daily as needed.  Marland Kitchen MAGNESIUM CITRATE PO Take by mouth.  . Misc Natural Products (ELDERBERRY ZINC/VIT C/IMMUNE MT) Use as directed in the mouth or throat.  . montelukast (SINGULAIR) 10 MG tablet TAKE 1 TABLET(10 MG) BY MOUTH DAILY  . Multiple Vitamin (MULTIVITAMIN) tablet Take 1 tablet by mouth daily.   No current facility-administered medications on file prior to visit.    Allergies:  Allergies  Allergen Reactions  . Penicillins     Has patient had a PCN reaction causing immediate rash, facial/tongue/throat swelling, SOB or lightheadedness with hypotension: No Has patient had a PCN reaction causing severe rash involving mucus membranes or skin necrosis: Unknown Has patient had a PCN reaction that required hospitalization: Unknown Has patient had a PCN reaction occurring within the last 10 years: Unknown If all of the above answers are "NO", then may proceed with Cephalosporin use.     Social History:  Social History   Socioeconomic History  . Marital status: Married    Spouse name: Not on file  . Number of children: 2  . Years of education: Not on file  . Highest education level: Not on file  Occupational History  .  Occupation: Art therapist  Tobacco Use  . Smoking status: Never Smoker  . Smokeless tobacco: Never Used  Vaping Use  . Vaping Use: Never used  Substance and Sexual Activity  . Alcohol use: Yes    Comment: Rare  . Drug use: No  . Sexual activity: Yes    Birth control/protection: Post-menopausal  Other Topics Concern  . Not on file  Social History Narrative   Exercise-yes   Social Determinants of Health   Financial Resource Strain: Not on file  Food Insecurity: Not on file  Transportation Needs: Not on file  Physical Activity: Not on file  Stress: Not on file  Social Connections: Not on file  Intimate Partner Violence: Not on file   Social History   Tobacco Use  Smoking  Status Never Smoker  Smokeless Tobacco Never Used   Social History   Substance and Sexual Activity  Alcohol Use Yes   Comment: Rare    Family History:  Family History  Problem Relation Age of Onset  . Hypertension Father        Mild  . Ulcerative colitis Father   . Cancer Father        esophageal and colon  . Stroke Father   . COPD Father   . Arthritis Mother   . Diabetes Brother   . Diabetes Paternal Grandmother   . Arthritis Brother   . Heart disease Neg Hx   . Breast cancer Neg Hx     Past medical history, surgical history, medications, allergies, family history and social history reviewed with patient today and changes made to appropriate areas of the chart.   Review of Systems  Constitutional: Negative.   HENT: Negative.   Eyes: Negative.   Respiratory: Negative.   Cardiovascular: Negative.   Gastrointestinal: Negative.   Genitourinary: Negative.   Musculoskeletal: Negative.   Skin: Negative.        Redness on her face  Neurological: Negative.   Endo/Heme/Allergies: Positive for environmental allergies. Negative for polydipsia. Does not bruise/bleed easily.  Psychiatric/Behavioral: Negative.    All other ROS negative except what is listed above and in the HPI.       Objective:    BP 127/66   Pulse 74   Temp 98.2 F (36.8 C)   Ht 5\' 5"  (1.651 m)   Wt 212 lb (96.2 kg)   LMP 05/07/2009   SpO2 97%   BMI 35.28 kg/m   Wt Readings from Last 3 Encounters:  09/16/20 212 lb (96.2 kg)  03/04/20 209 lb 12.8 oz (95.2 kg)  02/24/20 208 lb (94.3 kg)    Physical Exam Vitals and nursing note reviewed.  Constitutional:      General: She is not in acute distress.    Appearance: Normal appearance. She is not ill-appearing, toxic-appearing or diaphoretic.  HENT:     Head: Normocephalic and atraumatic.     Right Ear: Tympanic membrane, ear canal and external ear normal. There is no impacted cerumen.     Left Ear: Tympanic membrane, ear canal and external ear normal. There is no impacted cerumen.     Nose: Nose normal. No congestion or rhinorrhea.     Mouth/Throat:     Mouth: Mucous membranes are moist.     Pharynx: Oropharynx is clear. No oropharyngeal exudate or posterior oropharyngeal erythema.  Eyes:     General: No scleral icterus.       Right eye: No discharge.        Left eye: No discharge.     Extraocular Movements: Extraocular movements intact.     Conjunctiva/sclera: Conjunctivae normal.     Pupils: Pupils are equal, round, and reactive to light.  Neck:     Vascular: No carotid bruit.  Cardiovascular:     Rate and Rhythm: Normal rate and regular rhythm.     Pulses: Normal pulses.     Heart sounds: No murmur heard. No friction rub. No gallop.   Pulmonary:     Effort: Pulmonary effort is normal. No respiratory distress.     Breath sounds: Normal breath sounds. No stridor. No wheezing, rhonchi or rales.  Chest:     Chest wall: No tenderness.    Abdominal:     General: Abdomen is flat. Bowel sounds are normal. There is no  distension.     Palpations: Abdomen is soft. There is no mass.     Tenderness: There is no abdominal tenderness. There is no right CVA tenderness, left CVA tenderness, guarding or rebound.     Hernia: No hernia is  present.  Genitourinary:    Comments: Pelvic exams deferred with shared decision making Musculoskeletal:        General: No swelling, tenderness, deformity or signs of injury.     Cervical back: Normal range of motion and neck supple. No rigidity. No muscular tenderness.     Right lower leg: No edema.     Left lower leg: No edema.  Lymphadenopathy:     Cervical: No cervical adenopathy.  Skin:    General: Skin is warm and dry.     Capillary Refill: Capillary refill takes less than 2 seconds.     Coloration: Skin is not jaundiced or pale.     Findings: No bruising, erythema, lesion or rash.  Neurological:     General: No focal deficit present.     Mental Status: She is alert and oriented to person, place, and time. Mental status is at baseline.     Cranial Nerves: No cranial nerve deficit.     Sensory: No sensory deficit.     Motor: No weakness.     Coordination: Coordination normal.     Gait: Gait normal.     Deep Tendon Reflexes: Reflexes normal.  Psychiatric:        Mood and Affect: Mood normal.        Behavior: Behavior normal.        Thought Content: Thought content normal.        Judgment: Judgment normal.     Results for orders placed or performed in visit on 09/16/20  Microscopic Examination   Urine  Result Value Ref Range   WBC, UA None seen 0 - 5 /hpf   RBC 0-2 0 - 2 /hpf   Epithelial Cells (non renal) 0-10 0 - 10 /hpf   Bacteria, UA None seen None seen/Few  Urinalysis, Routine w reflex microscopic  Result Value Ref Range   Specific Gravity, UA <1.005 (L) 1.005 - 1.030   pH, UA 6.5 5.0 - 7.5   Color, UA Yellow Yellow   Appearance Ur Clear Clear   Leukocytes,UA Negative Negative   Protein,UA Negative Negative/Trace   Glucose, UA Negative Negative   Ketones, UA Negative Negative   RBC, UA Trace (A) Negative   Bilirubin, UA Negative Negative   Urobilinogen, Ur 0.2 0.2 - 1.0 mg/dL   Nitrite, UA Negative Negative   Microscopic Examination See below:    Microalbumin, Urine Waived  Result Value Ref Range   Microalb, Ur Waived 10 0 - 19 mg/L   Creatinine, Urine Waived 50 10 - 300 mg/dL   Microalb/Creat Ratio <30 <30 mg/g      Assessment & Plan:   Problem List Items Addressed This Visit      Respiratory   Asthma, mild intermittent    Under good control on current regimen. Continue current regimen. Continue to monitor. Call with any concerns. Refills given.         Relevant Medications   albuterol (PROVENTIL HFA) 108 (90 Base) MCG/ACT inhaler     Digestive   GERD (gastroesophageal reflux disease)    Under good control on current regimen. Continue current regimen. Continue to monitor. Call with any concerns. Refills given.        Relevant Medications  MAGNESIUM CITRATE PO     Genitourinary   Benign hypertensive renal disease    Under good control on current regimen. Continue current regimen. Continue to monitor. Call with any concerns. Refills given. Labs drawn today.      Relevant Orders   Microalbumin, Urine Waived (Completed)   CKD (chronic kidney disease) stage 2, GFR 60-89 ml/min    Rechecking labs checked today. Await results.         Other   B12 deficiency    Rechecking labs today. Await results. Treat as needed.       Relevant Orders   B12   Major depression, recurrent, full remission ([place])    Under good control on current regimen. Continue current regimen. Continue to monitor. Call with any concerns. Refills given.        Relevant Medications   FLUoxetine (PROZAC) 20 MG capsule    Other Visit Diagnoses    Routine general medical examination at a health care facility    -  Primary   Vaccines up to date. Screening labs checked today. Pap up to date Mammogram and colonoscopy ordered today. Continue to monitor. Call with any concerns.    Relevant Orders   CBC with Differential/Platelet   Comprehensive metabolic panel   Lipid Panel w/o Chol/HDL Ratio   Urinalysis, Routine w reflex microscopic  (Completed)   TSH   Microalbumin, Urine Waived (Completed)   B12   Hepatitis C Antibody   HIV Antibody (routine testing w rflx)   Screening for cholesterol level       Labs drawn today. Await results. Treat as needed.    Screening for colon cancer       Referral to GI made today.   Relevant Orders   Ambulatory referral to Gastroenterology   Breast lump on left side at 2 o'clock position       Will get her set with mammogram and [place], concern for mastitis- will treat with doxycycline. Call with any concerns.    Relevant Orders   MM DIAG BREAST TOMO BILATERAL   US BREAST LTD UNI LEFT INC AXILLA   RUQ pain       Will check RUQ [place]. Ordered today. Await today.   Relevant Orders   US Abdomen Limited RUQ (LIVER/GB)       Follow up plan: Return in about 6 months (around 03/19/2021).   LABORATORY TESTING:  - Pap smear: up to date  IMMUNIZATIONS:   - Tdap: Tetanus vaccination status reviewed: last tetanus booster within 10 years. - Influenza: Postponed to flu season - Pneumovax: Refused - Prevnar: Not applicable - COVID: Refused  SCREENING: -Mammogram: Ordered today  - Colonoscopy: Ordered today  - Bone Density: Not applicable   PATIENT COUNSELING:   Advised to take 1 mg of folate supplement per day if capable of pregnancy.   Sexuality: Discussed sexually transmitted diseases, partner selection, use of condoms, avoidance of unintended pregnancy  and contraceptive alternatives.   Advised to avoid cigarette smoking.  I discussed with the patient that most people either abstain from alcohol or drink within safe limits (<=14/week and <=4 drinks/occasion for males, <=7/weeks and <= 3 drinks/occasion for females) and that the risk for alcohol disorders and other health effects rises proportionally with the number of drinks per week and how often a drinker exceeds daily limits.  Discussed cessation/primary prevention of drug use and availability of treatment for abuse.   Diet:  Encouraged to adjust caloric intake to maintain  or achieve ideal  body weight, to reduce intake of dietary saturated fat and total fat, to limit sodium intake by avoiding high sodium foods and not adding table salt, and to maintain adequate dietary potassium and calcium preferably from fresh fruits, vegetables, and low-fat dairy products.    stressed the importance of regular exercise  Injury prevention: Discussed safety belts, safety helmets, smoke detector, smoking near bedding or upholstery.   Dental health: Discussed importance of regular tooth brushing, flossing, and dental visits.    NEXT PREVENTATIVE PHYSICAL DUE IN 1 YEAR. Return in about 6 months (around 03/19/2021).

## 2020-09-16 NOTE — Assessment & Plan Note (Signed)
Rechecking labs today. Await results. Treat as needed.  °

## 2020-09-16 NOTE — Patient Instructions (Addendum)
Call to schedule your mammogram: [facility]  Address: [address], [place],  [address]  Phone: [phone]   Health Maintenance, Female Adopting a healthy lifestyle and getting preventive care are important in promoting health and wellness. Ask your health care provider about:  The right schedule for you to have regular tests and exams.  Things you can do on your own to prevent diseases and keep yourself healthy. What should I know about diet, weight, and exercise? Eat a healthy diet  Eat a diet that includes plenty of vegetables, fruits, low-fat dairy products, and lean protein.  Do not eat a lot of foods that are high in solid fats, added sugars, or sodium.   Maintain a healthy weight Body mass index (BMI) is used to identify weight problems. It estimates body fat based on height and weight. Your health care provider can help determine your BMI and help you achieve or maintain a healthy weight. Get regular exercise Get regular exercise. This is one of the most important things you can do for your health. Most adults should:  Exercise for at least 150 minutes each week. The exercise should increase your heart rate and make you sweat (moderate-intensity exercise).  Do strengthening exercises at least twice a week. This is in addition to the moderate-intensity exercise.  Spend less time sitting. Even light physical activity can be beneficial. Watch cholesterol and blood lipids Have your blood tested for lipids and cholesterol at 62 years of age, then have this test every 5 years. Have your cholesterol levels checked more often if:  Your lipid or cholesterol levels are high.  You are older than 62 years of age.  You are at high risk for heart disease. What should I know about cancer screening? Depending on your health history and family history, you may need to have cancer screening at various ages. This may include screening  for:  Breast cancer.  Cervical cancer.  Colorectal cancer.  Skin cancer.  Lung cancer. What should I know about heart disease, diabetes, and high blood pressure? Blood pressure and heart disease  High blood pressure causes heart disease and increases the risk of stroke. This is more likely to develop in people who have high blood pressure readings, are of African descent, or are overweight.  Have your blood pressure checked: ? Every 3-5 years if you are 18-39 years of age. ? Every year if you are 40 years old or older. Diabetes Have regular diabetes screenings. This checks your fasting blood sugar level. Have the screening done:  Once every three years after age 40 if you are at a normal weight and have a low risk for diabetes.  More often and at a younger age if you are overweight or have a high risk for diabetes. What should I know about preventing infection? Hepatitis B If you have a higher risk for hepatitis B, you should be screened for this virus. Talk with your health care provider to find out if you are at risk for hepatitis B infection. Hepatitis C Testing is recommended for:  Everyone born from 1945 through 1965.  Anyone with known risk factors for hepatitis C. Sexually transmitted infections (STIs)  Get screened for STIs, including gonorrhea and chlamydia, if: ? You are sexually active and are younger than 62 years of age. ? You are older than 62 years of age and your health care provider tells you that you are at risk for this type of   infection. ? Your sexual activity has changed since you were last screened, and you are at increased risk for chlamydia or gonorrhea. Ask your health care provider if you are at risk.  Ask your health care provider about whether you are at high risk for HIV. Your health care provider may recommend a prescription medicine to help prevent HIV infection. If you choose to take medicine to prevent HIV, you should first get tested for HIV.  You should then be tested every 3 months for as long as you are taking the medicine. Pregnancy  If you are about to stop having your period (premenopausal) and you may become pregnant, seek counseling before you get pregnant.  Take 400 to 800 micrograms (mcg) of folic acid every day if you become pregnant.  Ask for birth control (contraception) if you want to prevent pregnancy. Osteoporosis and menopause Osteoporosis is a disease in which the bones lose minerals and strength with aging. This can result in bone fractures. If you are 65 years old or older, or if you are at risk for osteoporosis and fractures, ask your health care provider if you should:  Be screened for bone loss.  Take a calcium or vitamin D supplement to lower your risk of fractures.  Be given hormone replacement therapy (HRT) to treat symptoms of menopause. Follow these instructions at home: Lifestyle  Do not use any products that contain nicotine or tobacco, such as cigarettes, e-cigarettes, and chewing tobacco. If you need help quitting, ask your health care provider.  Do not use street drugs.  Do not share needles.  Ask your health care provider for help if you need support or information about quitting drugs. Alcohol use  Do not drink alcohol if: ? Your health care provider tells you not to drink. ? You are pregnant, may be pregnant, or are planning to become pregnant.  If you drink alcohol: ? Limit how much you use to 0-1 drink a day. ? Limit intake if you are breastfeeding.  Be aware of how much alcohol is in your drink. In the [place] one drink equals one 12 oz bottle of beer (355 mL), one 5 oz glass of wine (148 mL), or one 1 oz glass of hard liquor (44 mL). General instructions  Schedule regular health, dental, and eye exams.  Stay current with your vaccines.  Tell your health care provider if: ? You often feel depressed. ? You have ever been abused or do not feel safe at  home. Summary  Adopting a healthy lifestyle and getting preventive care are important in promoting health and wellness.  Follow your health care provider's instructions about healthy diet, exercising, and getting tested or screened for diseases.  Follow your health care provider's instructions on monitoring your cholesterol and blood pressure. This information is not intended to replace advice given to you by your health care provider. Make sure you discuss any questions you have with your health care provider. Document Revised: 04/16/2018 Document Reviewed: 04/16/2018 Elsevier Patient Education  2021 [employer]  

## 2020-09-17 LAB — CBC WITH DIFFERENTIAL/PLATELET
Basophils Absolute: 0 10*3/uL (ref 0.0–0.2)
Basos: 1 %
EOS (ABSOLUTE): 0.2 10*3/uL (ref 0.0–0.4)
Eos: 3 %
Hematocrit: 35.9 % (ref 34.0–46.6)
Hemoglobin: 11.6 g/dL (ref 11.1–15.9)
Immature Grans (Abs): 0 10*3/uL (ref 0.0–0.1)
Immature Granulocytes: 1 %
Lymphocytes Absolute: 2.2 10*3/uL (ref 0.7–3.1)
Lymphs: 28 %
MCH: 26.2 pg — ABNORMAL LOW (ref 26.6–33.0)
MCHC: 32.3 g/dL (ref 31.5–35.7)
MCV: 81 fL (ref 79–97)
Monocytes Absolute: 0.7 10*3/uL (ref 0.1–0.9)
Monocytes: 8 %
Neutrophils Absolute: 4.7 10*3/uL (ref 1.4–7.0)
Neutrophils: 59 %
Platelets: 365 10*3/uL (ref 150–450)
RBC: 4.42 x10E6/uL (ref 3.77–5.28)
RDW: 14.2 % (ref 11.7–15.4)
WBC: 7.9 10*3/uL (ref 3.4–10.8)

## 2020-09-17 LAB — COMPREHENSIVE METABOLIC PANEL
ALT: 11 IU/L (ref 0–32)
AST: 18 IU/L (ref 0–40)
Albumin/Globulin Ratio: 2 (ref 1.2–2.2)
Albumin: 4.5 g/dL (ref 3.8–4.8)
Alkaline Phosphatase: 95 IU/L (ref 44–121)
BUN/Creatinine Ratio: 15 (ref 12–28)
BUN: 12 mg/dL (ref 8–27)
Bilirubin Total: 0.2 mg/dL (ref 0.0–1.2)
CO2: 24 mmol/L (ref 20–29)
Calcium: 9.2 mg/dL (ref 8.7–10.3)
Chloride: 102 mmol/L (ref 96–106)
Creatinine, Ser: 0.78 mg/dL (ref 0.57–1.00)
Globulin, Total: 2.3 g/dL (ref 1.5–4.5)
Glucose: 96 mg/dL (ref 65–99)
Potassium: 4.6 mmol/L (ref 3.5–5.2)
Sodium: 141 mmol/L (ref 134–144)
Total Protein: 6.8 g/dL (ref 6.0–8.5)
eGFR: 86 mL/min/{1.73_m2} (ref >59)

## 2020-09-17 LAB — TSH: TSH: 1.5 u[IU]/mL (ref 0.450–4.500)

## 2020-09-17 LAB — LIPID PANEL W/O CHOL/HDL RATIO
Cholesterol, Total: 181 mg/dL (ref 100–199)
HDL: 48 mg/dL (ref >39)
LDL Chol Calc (NIH): 121 mg/dL — ABNORMAL HIGH (ref 0–99)
Triglycerides: 65 mg/dL (ref 0–149)
VLDL Cholesterol Cal: 12 mg/dL (ref 5–40)

## 2020-09-17 LAB — HIV ANTIBODY (ROUTINE TESTING W REFLEX): HIV Screen 4th Generation wRfx: NONREACTIVE

## 2020-09-17 LAB — HEPATITIS C ANTIBODY: Hep C Virus Ab: <0.1 s/co ratio (ref 0.0–0.9)

## 2020-09-17 LAB — VITAMIN B12: Vitamin B-12: 1558 pg/mL — ABNORMAL HIGH (ref 232–1245)

## 2020-09-18 ENCOUNTER — Encounter: Payer: Self-pay | Admitting: Family Medicine

## 2020-10-13 ENCOUNTER — Ambulatory Visit
Admission: RE | Admit: 2020-10-13 | Discharge: 2020-10-13 | Disposition: A | Discharge status: Home / Self Care | Payer: 59 | Source: Ambulatory Visit | Attending: Family Medicine | Admitting: Family Medicine

## 2020-10-13 ENCOUNTER — Other Ambulatory Visit: Payer: Self-pay

## 2020-10-13 DIAGNOSIS — R1011 Right upper quadrant pain: Secondary | ICD-10-CM | POA: Insufficient documentation

## 2020-11-05 ENCOUNTER — Other Ambulatory Visit: Payer: Self-pay | Admitting: Family Medicine

## 2020-11-18 ENCOUNTER — Ambulatory Visit
Admission: RE | Admit: 2020-11-18 | Discharge: 2020-11-18 | Disposition: A | Discharge status: Home / Self Care | Payer: 59 | Source: Ambulatory Visit | Attending: Family Medicine | Admitting: Family Medicine

## 2020-11-18 ENCOUNTER — Other Ambulatory Visit: Payer: Self-pay

## 2020-11-18 DIAGNOSIS — N6321 Unspecified lump in the left breast, upper outer quadrant: Secondary | ICD-10-CM | POA: Insufficient documentation

## 2020-11-25 ENCOUNTER — Other Ambulatory Visit: Payer: Self-pay | Admitting: Family Medicine

## 2021-03-10 ENCOUNTER — Ambulatory Visit: Payer: 59 | Admitting: Family Medicine

## 2021-04-13 ENCOUNTER — Telehealth: Payer: Self-pay | Admitting: Gastroenterology

## 2021-04-13 NOTE — Telephone Encounter (Signed)
Inbound call from pt requesting a call back to schedule her procedure.

## 2021-04-14 NOTE — Telephone Encounter (Signed)
Returned patients call. LVM to call office back. 

## 2021-04-17 ENCOUNTER — Telehealth: Payer: Self-pay

## 2021-04-17 NOTE — Telephone Encounter (Signed)
Returned patients call. LVM to call office back. 

## 2021-04-18 ENCOUNTER — Other Ambulatory Visit: Payer: Self-pay

## 2021-04-18 DIAGNOSIS — Z1211 Encounter for screening for malignant neoplasm of colon: Secondary | ICD-10-CM

## 2021-04-18 MED ORDER — NA SULFATE-K SULFATE-MG SULF 17.5-3.13-1.6 GM/177ML PO SOLN: 1.0000 | Freq: Once | ORAL | 0 refills | Status: AC

## 2021-04-18 NOTE — Telephone Encounter (Signed)
Procedure scheduled for 05/26/21.

## 2021-04-18 NOTE — Progress Notes (Signed)
Gastroenterology Pre-Procedure Review  Request Date: 05/26/2021 Requesting Physician: Dr. Allen Norris  PATIENT REVIEW QUESTIONS: The patient responded to the following health history questions as indicated:    1. Are you having any GI issues? no 2. Do you have a personal history of Polyps?  04/08/2017 EGD. 3. Do you have a family history of Colon Cancer or Polyps? no 4. Diabetes Mellitus? no 5. Joint replacements in the past 12 months?no 6. Major health problems in the past 3 months?no 7. Any artificial heart valves, MVP, or defibrillator?no    MEDICATIONS & ALLERGIES:    Patient reports the following regarding taking any anticoagulation/antiplatelet therapy:   Plavix, Coumadin, Eliquis, Xarelto, Lovenox, Pradaxa, Brilinta, or Effient? no Aspirin? no  Patient confirms/reports the following medications:  Current Outpatient Medications  Medication Sig Dispense Refill   albuterol (PROVENTIL HFA) 108 (90 Base) MCG/ACT inhaler Inhale 2 puffs into the lungs every 4 (four) hours as needed. 18 g 6   baclofen (LIORESAL) 10 MG tablet Take 1 tablet (10 mg total) by mouth 3 (three) times daily. 90 each 1   Cyanocobalamin (VITAMIN B-12) 1000 MCG SUBL Place 1 tablet (1,000 mcg total) under the tongue daily. 30 tablet 11   doxycycline (VIBRA-TABS) 100 MG tablet Take 1 tablet (100 mg total) by mouth 2 (two) times daily. 10 tablet 0   esomeprazole (NEXIUM) 20 MG capsule Take 1 capsule (20 mg total) by mouth daily as needed.     FLUoxetine (PROZAC) 20 MG capsule TAKE 1 CAPSULE(20 MG) BY MOUTH DAILY 90 capsule 1   fluticasone (FLONASE) 50 MCG/ACT nasal spray Place 2 sprays into both nostrils daily. 16 g 11   lisinopril (ZESTRIL) 10 MG tablet TAKE 1 TABLET(10 MG) BY MOUTH DAILY 90 tablet 1   MAGNESIUM CITRATE PO Take by mouth.     Misc Natural Products (ELDERBERRY ZINC/VIT C/IMMUNE MT) Use as directed in the mouth or throat.     montelukast (SINGULAIR) 10 MG tablet TAKE 1 TABLET(10 MG) BY MOUTH DAILY 90 tablet  1   Multiple Vitamin (MULTIVITAMIN) tablet Take 1 tablet by mouth daily.     No current facility-administered medications for this visit.    Patient confirms/reports the following allergies:  Allergies  Allergen Reactions   Penicillins     Has patient had a PCN reaction causing immediate rash, facial/tongue/throat swelling, SOB or lightheadedness with hypotension: No Has patient had a PCN reaction causing severe rash involving mucus membranes or skin necrosis: Unknown Has patient had a PCN reaction that required hospitalization: Unknown Has patient had a PCN reaction occurring within the last 10 years: Unknown If all of the above answers are "NO", then may proceed with Cephalosporin use.     No orders of the defined types were placed in this encounter.   AUTHORIZATION INFORMATION Primary Insurance: 1D#: Group #:  Secondary Insurance: 1D#: Group #:  SCHEDULE INFORMATION: Date: 05/26/2021 Time: Location: [facility]

## 2021-04-21 ENCOUNTER — Ambulatory Visit: Payer: 59 | Admitting: Family Medicine

## 2021-04-21 ENCOUNTER — Other Ambulatory Visit: Payer: Self-pay

## 2021-04-21 ENCOUNTER — Encounter: Payer: Self-pay | Admitting: Family Medicine

## 2021-04-21 VITALS — BP 120/73 | HR 88 | Temp 98.7°F | Ht 65.0 in | Wt 223.2 lb

## 2021-04-21 DIAGNOSIS — J4521 Mild intermittent asthma with (acute) exacerbation: Secondary | ICD-10-CM

## 2021-04-21 DIAGNOSIS — F410 Panic disorder [episodic paroxysmal anxiety] without agoraphobia: Secondary | ICD-10-CM | POA: Diagnosis not present

## 2021-04-21 DIAGNOSIS — E538 Deficiency of other specified B group vitamins: Secondary | ICD-10-CM

## 2021-04-21 DIAGNOSIS — I129 Hypertensive chronic kidney disease with stage 1 through stage 4 chronic kidney disease, or unspecified chronic kidney disease: Secondary | ICD-10-CM | POA: Diagnosis not present

## 2021-04-21 DIAGNOSIS — F3342 Major depressive disorder, recurrent, in full remission: Secondary | ICD-10-CM

## 2021-04-21 MED ORDER — BACLOFEN 10 MG PO TABS: 10.0000 mg | ORAL_TABLET | Freq: Three times a day (TID) | ORAL | 1 refills | Status: DC

## 2021-04-21 MED ORDER — FLUOXETINE HCL 20 MG PO CAPS: 20.0000 mg | ORAL_CAPSULE | Freq: Every day | ORAL | 1 refills | Status: DC

## 2021-04-21 MED ORDER — MONTELUKAST SODIUM 10 MG PO TABS: ORAL_TABLET | ORAL | 1 refills | Status: DC

## 2021-04-21 MED ORDER — ALPRAZOLAM 0.5 MG PO TABS: 0.2500 mg | ORAL_TABLET | Freq: Two times a day (BID) | ORAL | 0 refills | Status: DC | PRN

## 2021-04-21 MED ORDER — LISINOPRIL 10 MG PO TABS: 10.0000 mg | ORAL_TABLET | Freq: Every day | ORAL | 1 refills | Status: DC

## 2021-04-21 MED ORDER — ALBUTEROL SULFATE HFA 108 (90 BASE) MCG/ACT IN AERS: 2.0000 | INHALATION_SPRAY | RESPIRATORY_TRACT | 6 refills | Status: DC | PRN

## 2021-04-21 NOTE — Assessment & Plan Note (Signed)
Under good control on current regimen. Continue current regimen. Continue to monitor. Call with any concerns. Refills given. Xanax given 20 pills should last at least 6 months. Call with any concerns.

## 2021-04-21 NOTE — Assessment & Plan Note (Signed)
Under good control on current regimen. Continue current regimen. Continue to monitor. Call with any concerns. Refills given. Labs drawn today.   

## 2021-04-21 NOTE — Progress Notes (Signed)
BP 120/73    Pulse 88    Temp 98.7 F (37.1 C)    Ht '5\' 5"'  (1.651 m)    Wt 223 lb 3.2 oz (101.2 kg)    LMP 05/07/2009    SpO2 97%    BMI 37.14 kg/m    Subjective:    Patient ID: Vanessa Harvey, female    DOB: 04-06-1961, 60 y.o.   MRN: [id number]  HPI: Vanessa Harvey is a 60 y.o. female  Chief Complaint  Patient presents with   Depression   Benign hypertensive renal disease   Asthma   DEPRESSION Mood status: controlled Satisfied with current treatment?: yes Symptom severity: mild  Duration of current treatment : chronic Side effects: no Medication compliance: excellent compliance Psychotherapy/counseling: no  Previous psychiatric medications: fluoxetine Depressed mood: yes Anxious mood: yes Anhedonia: no Significant weight loss or gain: no Insomnia: no  Fatigue: yes Feelings of worthlessness or guilt: no Impaired concentration/indecisiveness: no Suicidal ideations: no Hopelessness: no Crying spells: no Depression screen [facility] 2/9 04/21/2021 09/16/2020 02/24/2020 06/26/2019 01/21/2019  Decreased Interest 0 0 0 0 0  Down, Depressed, Hopeless 0 0 0 0 0  PHQ - 2 Score 0 0 0 0 0  Altered sleeping 0 0 0 2 0  Tired, decreased energy 1 1 0 1 0  Change in appetite 1 1 0 2 0  Feeling bad or failure about yourself  0 0 0 0 0  Trouble concentrating 1 0 0 0 0  Moving slowly or fidgety/restless 0 0 0 0 0  Suicidal thoughts 0 0 0 0 0  PHQ-9 Score 3 2 0 5 0  Difficult doing work/chores - Not difficult at all Not difficult at all Not difficult at all Not difficult at all  Some recent data might be hidden   GAD 7 : Generalized Anxiety Score 04/21/2021 02/24/2020 06/26/2019 01/21/2019  Nervous, Anxious, on Edge 1 0 0 0  Control/stop worrying 0 0 0 0  Worry too much - different things 0 0 0 0  Trouble relaxing 0 0 0 0  Restless 0 0 0 0  Easily annoyed or irritable 1 0 0 0  Afraid - awful might happen 0 0 0 0  Total GAD 7 Score 2 0 0 0  Anxiety Difficulty Somewhat difficult Not  difficult at all Not difficult at all Not difficult at all   HYPERTENSION Hypertension status: controlled  Satisfied with current treatment? no Duration of hypertension: chronic BP monitoring frequency:  not checking BP medication side effects:  no Medication compliance: excellent compliance Previous BP meds: lisinopril Aspirin: no Recurrent headaches: no Visual changes: no Palpitations: no Dyspnea: no Chest pain: no Lower extremity edema: no Dizzy/lightheaded: no  Relevant past medical, surgical, family and social history reviewed and updated as indicated. Interim medical history since our last visit reviewed. Allergies and medications reviewed and updated.  Review of Systems  Constitutional: Negative.   Respiratory: Negative.    Cardiovascular: Negative.   Gastrointestinal: Negative.   Musculoskeletal: Negative.   Neurological: Negative.   Psychiatric/Behavioral: Negative.     Per HPI unless specifically indicated above     Objective:    BP 120/73    Pulse 88    Temp 98.7 F (37.1 C)    Ht '5\' 5"'  (1.651 m)    Wt 223 lb 3.2 oz (101.2 kg)    LMP 05/07/2009    SpO2 97%    BMI 37.14 kg/m   Wt Readings from Last 3  Encounters:  04/21/21 223 lb 3.2 oz (101.2 kg)  09/16/20 212 lb (96.2 kg)  03/04/20 209 lb 12.8 oz (95.2 kg)    Physical Exam Vitals and nursing note reviewed.  Constitutional:      General: She is not in acute distress.    Appearance: Normal appearance. She is not ill-appearing, toxic-appearing or diaphoretic.  HENT:     Head: Normocephalic and atraumatic.     Right Ear: External ear normal.     Left Ear: External ear normal.     Nose: Nose normal.     Mouth/Throat:     Mouth: Mucous membranes are moist.     Pharynx: Oropharynx is clear.  Eyes:     General: No scleral icterus.       Right eye: No discharge.        Left eye: No discharge.     Extraocular Movements: Extraocular movements intact.     Conjunctiva/sclera: Conjunctivae normal.      Pupils: Pupils are equal, round, and reactive to light.  Cardiovascular:     Rate and Rhythm: Normal rate and regular rhythm.     Pulses: Normal pulses.     Heart sounds: Normal heart sounds. No murmur heard.   No friction rub. No gallop.  Pulmonary:     Effort: Pulmonary effort is normal. No respiratory distress.     Breath sounds: Normal breath sounds. No stridor. No wheezing, rhonchi or rales.  Chest:     Chest wall: No tenderness.  Musculoskeletal:        General: Normal range of motion.     Cervical back: Normal range of motion and neck supple.  Skin:    General: Skin is warm and dry.     Capillary Refill: Capillary refill takes less than 2 seconds.     Coloration: Skin is not jaundiced or pale.     Findings: No bruising, erythema, lesion or rash.  Neurological:     General: No focal deficit present.     Mental Status: She is alert and oriented to person, place, and time. Mental status is at baseline.  Psychiatric:        Mood and Affect: Mood normal.        Behavior: Behavior normal.        Thought Content: Thought content normal.        Judgment: Judgment normal.    Results for orders placed or performed in visit on 09/16/20  Microscopic Examination   Urine  Result Value Ref Range   WBC, UA None seen 0 - 5 /hpf   RBC 0-2 0 - 2 /hpf   Epithelial Cells (non renal) 0-10 0 - 10 /hpf   Bacteria, UA None seen None seen/Few  CBC with Differential/Platelet  Result Value Ref Range   WBC 7.9 3.4 - 10.8 x10E3/uL   RBC 4.42 3.77 - 5.28 x10E6/uL   Hemoglobin 11.6 11.1 - 15.9 g/dL   Hematocrit 35.9 34.0 - 46.6 %   MCV 81 79 - 97 fL   MCH 26.2 (L) 26.6 - 33.0 pg   MCHC 32.3 31.5 - 35.7 g/dL   RDW 14.2 11.7 - 15.4 %   Platelets 365 150 - 450 x10E3/uL   Neutrophils 59 Not Estab. %   Lymphs 28 Not Estab. %   Monocytes 8 Not Estab. %   Eos 3 Not Estab. %   Basos 1 Not Estab. %   Neutrophils Absolute 4.7 1.4 - 7.0 x10E3/uL   Lymphocytes Absolute  2.2 0.7 - 3.1 x10E3/uL    Monocytes Absolute 0.7 0.1 - 0.9 x10E3/uL   EOS (ABSOLUTE) 0.2 0.0 - 0.4 x10E3/uL   Basophils Absolute 0.0 0.0 - 0.2 x10E3/uL   Immature Granulocytes 1 Not Estab. %   Immature Grans (Abs) 0.0 0.0 - 0.1 x10E3/uL  Comprehensive metabolic panel  Result Value Ref Range   Glucose 96 65 - 99 mg/dL   BUN 12 8 - 27 mg/dL   Creatinine, Ser 0.78 0.57 - 1.00 mg/dL   eGFR 86 >59 mL/min/1.73   BUN/Creatinine Ratio 15 12 - 28   Sodium 141 134 - 144 mmol/L   Potassium 4.6 3.5 - 5.2 mmol/L   Chloride 102 96 - 106 mmol/L   CO2 24 20 - 29 mmol/L   Calcium 9.2 8.7 - 10.3 mg/dL   Total Protein 6.8 6.0 - 8.5 g/dL   Albumin 4.5 3.8 - 4.8 g/dL   Globulin, Total 2.3 1.5 - 4.5 g/dL   Albumin/Globulin Ratio 2.0 1.2 - 2.2   Bilirubin Total 0.2 0.0 - 1.2 mg/dL   Alkaline Phosphatase 95 44 - 121 IU/L   AST 18 0 - 40 IU/L   ALT 11 0 - 32 IU/L  Lipid Panel w/o Chol/HDL Ratio  Result Value Ref Range   Cholesterol, Total 181 100 - 199 mg/dL   Triglycerides 65 0 - 149 mg/dL   HDL 48 >39 mg/dL   VLDL Cholesterol Cal 12 5 - 40 mg/dL   LDL Chol Calc (NIH) 121 (H) 0 - 99 mg/dL  Urinalysis, Routine w reflex microscopic  Result Value Ref Range   Specific Gravity, UA <1.005 (L) 1.005 - 1.030   pH, UA 6.5 5.0 - 7.5   Color, UA Yellow Yellow   Appearance Ur Clear Clear   Leukocytes,UA Negative Negative   Protein,UA Negative Negative/Trace   Glucose, UA Negative Negative   Ketones, UA Negative Negative   RBC, UA Trace (A) Negative   Bilirubin, UA Negative Negative   Urobilinogen, Ur 0.2 0.2 - 1.0 mg/dL   Nitrite, UA Negative Negative   Microscopic Examination See below:   TSH  Result Value Ref Range   TSH 1.500 0.450 - 4.500 uIU/mL  Microalbumin, Urine Waived  Result Value Ref Range   Microalb, Ur Waived 10 0 - 19 mg/L   Creatinine, Urine Waived 50 10 - 300 mg/dL   Microalb/Creat Ratio <30 <30 mg/g  B12  Result Value Ref Range   Vitamin B-12 1,558 (H) 232 - 1,245 pg/mL  Hepatitis C Antibody  Result  Value Ref Range   Hep C Virus Ab <0.1 0.0 - 0.9 s/co ratio  HIV Antibody (routine testing w rflx)  Result Value Ref Range   HIV Screen 4th Generation wRfx Non Reactive Non Reactive      Assessment & Plan:   Problem List Items Addressed This Visit       Respiratory   Asthma, mild intermittent    Under good control on current regimen. Continue current regimen. Continue to monitor. Call with any concerns. Refills given. Labs drawn today.      Relevant Medications   montelukast (SINGULAIR) 10 MG tablet   albuterol (PROVENTIL HFA) 108 (90 Base) MCG/ACT inhaler     Genitourinary   Benign hypertensive renal disease - Primary    Under good control on current regimen. Continue current regimen. Continue to monitor. Call with any concerns. Refills given. Labs drawn today.       Relevant Orders   Basic metabolic panel  Other   B12 deficiency    Under good control on current regimen. Continue current regimen. Continue to monitor. Call with any concerns. Refills given. Labs drawn today.      Relevant Orders   B12   PANIC DISORDER    Under good control on current regimen. Continue current regimen. Continue to monitor. Call with any concerns. Refills given. Xanax given 20 pills should last at least 6 months. Call with any concerns.       Relevant Medications   ALPRAZolam (XANAX) 0.5 MG tablet   FLUoxetine (PROZAC) 20 MG capsule   Major depression, recurrent, full remission ([place])    Under good control on current regimen. Continue current regimen. Continue to monitor. Call with any concerns. Refills given. Xanax given 20 pills should last at least 6 months. Call with any concerns.       Relevant Medications   ALPRAZolam (XANAX) 0.5 MG tablet   FLUoxetine (PROZAC) 20 MG capsule     Follow up plan: Return in about 6 months (around 10/20/2021) for physical.

## 2021-04-22 LAB — BASIC METABOLIC PANEL
BUN/Creatinine Ratio: 14 (ref 12–28)
BUN: 12 mg/dL (ref 8–27)
CO2: 24 mmol/L (ref 20–29)
Calcium: 9.4 mg/dL (ref 8.7–10.3)
Chloride: 102 mmol/L (ref 96–106)
Creatinine, Ser: 0.83 mg/dL (ref 0.57–1.00)
Glucose: 130 mg/dL — ABNORMAL HIGH (ref 70–99)
Potassium: 4.6 mmol/L (ref 3.5–5.2)
Sodium: 139 mmol/L (ref 134–144)
eGFR: 80 mL/min/{1.73_m2} (ref >59)

## 2021-04-22 LAB — VITAMIN B12: Vitamin B-12: 732 pg/mL (ref 232–1245)

## 2021-05-07 HISTORY — PX: CATARACT EXTRACTION: SUR2

## 2021-05-10 ENCOUNTER — Encounter: Payer: Self-pay | Admitting: Gastroenterology

## 2021-05-23 ENCOUNTER — Other Ambulatory Visit: Payer: Self-pay

## 2021-05-23 MED ORDER — PEG 3350-KCL-NA BICARB-NACL 420 G PO SOLR: 4000.0000 mL | Freq: Once | ORAL | 0 refills | Status: AC

## 2021-05-23 NOTE — Progress Notes (Signed)
Cheaper prep sent to pharmacy [facility].

## 2021-05-25 ENCOUNTER — Other Ambulatory Visit: Payer: Self-pay | Admitting: Family Medicine

## 2021-05-25 NOTE — Telephone Encounter (Signed)
Requested Prescriptions  Pending Prescriptions Disp Refills   lisinopril (ZESTRIL) 10 MG tablet [Pharmacy Med Name: LISINOPRIL 10MG  TABLETS] 90 tablet 1    Sig: TAKE 1 TABLET(10 MG) BY MOUTH DAILY     Cardiovascular:  ACE Inhibitors Passed - 05/25/2021  3:51 PM      Passed - Cr in normal range and within 180 days    Creat  Date Value Ref Range Status  10/18/2017 0.82 0.50 - 1.05 mg/dL Final    Comment:    For patients >63 years of age, the reference limit for Creatinine is approximately 13% higher for people identified as African-American. .    Creatinine, Ser  Date Value Ref Range Status  04/21/2021 0.83 0.57 - 1.00 mg/dL Final         Passed - K in normal range and within 180 days    Potassium  Date Value Ref Range Status  04/21/2021 4.6 3.5 - 5.2 mmol/L Final         Passed - Patient is not pregnant      Passed - Last BP in normal range    BP Readings from Last 1 Encounters:  04/21/21 120/73         Passed - Valid encounter within last 6 months    Recent Outpatient Visits          1 month ago Benign hypertensive renal disease   [facility] [place], Megan P, DO   8 months ago Routine general medical examination at a health care facility   [facility], [place], DO   1 year ago Acute right-sided low back pain with right-sided sciatica   [place], Megan P, DO   1 year ago Acute right-sided low back pain without sciatica   [place], Megan P, DO   1 year ago Mild intermittent asthma with acute exacerbation   [facility] Valerie [place], DO      Future Appointments            In 4 months Wynetta Emery, Barb Merino, DO [facility]

## 2021-05-26 ENCOUNTER — Encounter
Admission: RE | Disposition: A | Discharge status: Home / Self Care | Payer: Self-pay | Source: Home / Self Care | Attending: Gastroenterology

## 2021-05-26 ENCOUNTER — Ambulatory Visit
Admission: RE | Admit: 2021-05-26 | Discharge: 2021-05-26 | Disposition: A | Discharge status: Home / Self Care | Payer: 59 | Attending: Gastroenterology | Admitting: Gastroenterology

## 2021-05-26 ENCOUNTER — Ambulatory Visit: Payer: 59 | Admitting: Anesthesiology

## 2021-05-26 ENCOUNTER — Encounter: Payer: Self-pay | Admitting: Gastroenterology

## 2021-05-26 ENCOUNTER — Other Ambulatory Visit: Payer: Self-pay

## 2021-05-26 DIAGNOSIS — K640 First degree hemorrhoids: Secondary | ICD-10-CM | POA: Insufficient documentation

## 2021-05-26 DIAGNOSIS — Z1211 Encounter for screening for malignant neoplasm of colon: Secondary | ICD-10-CM

## 2021-05-26 DIAGNOSIS — Z860100 Personal history of colon polyps, unspecified: Secondary | ICD-10-CM

## 2021-05-26 DIAGNOSIS — J45909 Unspecified asthma, uncomplicated: Secondary | ICD-10-CM | POA: Insufficient documentation

## 2021-05-26 DIAGNOSIS — K219 Gastro-esophageal reflux disease without esophagitis: Secondary | ICD-10-CM | POA: Diagnosis not present

## 2021-05-26 HISTORY — PX: COLONOSCOPY WITH PROPOFOL: SHX5780

## 2021-05-26 SURGERY — COLONOSCOPY WITH PROPOFOL
Anesthesia: General | Site: Rectum

## 2021-05-26 MED ORDER — ACETAMINOPHEN 160 MG/5ML PO SOLN: 325.0000 mg | Freq: Once | ORAL | Status: DC

## 2021-05-26 MED ORDER — LACTATED RINGERS IV SOLN: INTRAVENOUS | Status: DC

## 2021-05-26 MED ORDER — PROPOFOL 10 MG/ML IV BOLUS
INTRAVENOUS | Status: DC | PRN
  Administered 2021-05-26: 50 mg via INTRAVENOUS
  Administered 2021-05-26: 100 mg via INTRAVENOUS

## 2021-05-26 MED ORDER — STERILE WATER FOR IRRIGATION IR SOLN
Status: DC | PRN
  Administered 2021-05-26: 1

## 2021-05-26 MED ORDER — ACETAMINOPHEN 325 MG PO TABS: 325.0000 mg | ORAL_TABLET | Freq: Once | ORAL | Status: DC

## 2021-05-26 MED ORDER — STERILE WATER FOR IRRIGATION IR SOLN
Status: DC | PRN
  Administered 2021-05-26 (×2): 60 mL

## 2021-05-26 MED ORDER — GLYCOPYRROLATE 0.2 MG/ML IJ SOLN
INTRAMUSCULAR | Status: DC | PRN
  Administered 2021-05-26: .2 mg via INTRAVENOUS

## 2021-05-26 MED ORDER — LIDOCAINE HCL (CARDIAC) PF 100 MG/5ML IV SOSY
PREFILLED_SYRINGE | INTRAVENOUS | Status: DC | PRN
  Administered 2021-05-26: 50 mg via INTRAVENOUS

## 2021-05-26 MED ORDER — SODIUM CHLORIDE 0.9 % IV SOLN: INTRAVENOUS | Status: DC

## 2021-05-26 SURGICAL SUPPLY — 6 items
GOWN CVR UNV OPN BCK APRN NK (MISCELLANEOUS) ×2 IMPLANT
GOWN ISOL THUMB LOOP REG UNIV (MISCELLANEOUS) ×4
KIT PRC NS LF DISP ENDO (KITS) ×1 IMPLANT
KIT PROCEDURE OLYMPUS (KITS) ×2
MANIFOLD NEPTUNE II (INSTRUMENTS) ×2 IMPLANT
WATER STERILE IRR 250ML POUR (IV SOLUTION) ×2 IMPLANT

## 2021-05-26 NOTE — Anesthesia Preprocedure Evaluation (Signed)
Anesthesia Evaluation  Patient identified by MRN, date of birth, ID band Patient awake    Reviewed: Allergy & Precautions, H&P , NPO status , Patient's Chart, lab work & pertinent test results  Airway Mallampati: III  TM Distance: >3 FB Neck ROM: full    Dental no notable dental hx.    Pulmonary asthma ,    Pulmonary exam normal breath sounds clear to auscultation       Cardiovascular Normal cardiovascular exam Rhythm:regular Rate:Normal     Neuro/Psych    GI/Hepatic GERD  ,  Endo/Other  overweight  Renal/GU      Musculoskeletal   Abdominal   Peds  Hematology   Anesthesia Other Findings   Reproductive/Obstetrics                             Anesthesia Physical Anesthesia Plan  ASA: 2  Anesthesia Plan: General   Post-op Pain Management: Minimal or no pain anticipated   Induction: Intravenous  PONV Risk Score and Plan: 3 and Treatment may vary due to age or medical condition, Propofol infusion and TIVA  Airway Management Planned: Natural Airway  Additional Equipment:   Intra-op Plan:   Post-operative Plan:   Informed Consent: I have reviewed the patients History and Physical, chart, labs and discussed the procedure including the risks, benefits and alternatives for the proposed anesthesia with the patient or authorized representative who has indicated his/her understanding and acceptance.     Dental Advisory Given  Plan Discussed with: CRNA  Anesthesia Plan Comments:         Anesthesia Quick Evaluation

## 2021-05-26 NOTE — Transfer of Care (Signed)
Immediate Anesthesia Transfer of Care Note  Patient: Vanessa Harvey  Procedure(s) Performed: COLONOSCOPY WITH PROPOFOL (Rectum)  Patient Location: PACU  Anesthesia Type: General  Level of Consciousness: awake, alert  and patient cooperative  Airway and Oxygen Therapy: Patient Spontanous Breathing and Patient connected to supplemental oxygen  Post-op Assessment: Post-op Vital signs reviewed, Patient's Cardiovascular Status Stable, Respiratory Function Stable, Patent Airway and No signs of Nausea or vomiting  Post-op Vital Signs: Reviewed and stable  Complications: No notable events documented.

## 2021-05-26 NOTE — Op Note (Signed)
[facility] Patient Name: Vanessa Harvey Procedure Date: 05/26/2021 7:12 AM MRN: [id number] Account #: [id number] Date of Birth: [birth year] Admit Type: Outpatient Age: 61 Room: [facility] OR ROOM 01 Gender: Female Note Status: Finalized Instrument Name: [id number] Procedure:             Colonoscopy Indications:           High risk colon cancer surveillance: Personal history                         of colonic polyps Providers:             [name] MD, MD Medicines:             Propofol per Anesthesia Complications:         No immediate complications. Procedure:             Pre-Anesthesia Assessment:                        - Prior to the procedure, a History and Physical was                         performed, and patient medications and allergies were                         reviewed. The patient's tolerance of previous                         anesthesia was also reviewed. The risks and benefits                         of the procedure and the sedation options and risks                         were discussed with the patient. All questions were                         answered, and informed consent was obtained. Prior                         Anticoagulants: The patient has taken no previous                         anticoagulant or antiplatelet agents. ASA Grade                         Assessment: II - A patient with mild systemic disease.                         After reviewing the risks and benefits, the patient                         was deemed in satisfactory condition to undergo the                         procedure.                        After obtaining informed consent, the colonoscope was  passed under direct vision. Throughout the procedure,                         the patient's blood pressure, pulse, and oxygen                         saturations were monitored continuously. The                         Colonoscope was  introduced through the anus and                         advanced to the the cecum, identified by appendiceal                         orifice and ileocecal valve. The colonoscopy was                         performed without difficulty. The patient tolerated                         the procedure well. The quality of the bowel                         preparation was excellent. Findings:      The perianal and digital rectal examinations were normal.      Non-bleeding internal hemorrhoids were found during retroflexion. The       hemorrhoids were Grade I (internal hemorrhoids that do not prolapse). Impression:            - Non-bleeding internal hemorrhoids.                        - No specimens collected. Recommendation:        - Discharge patient to home.                        - Resume previous diet.                        - Continue present medications.                        - Repeat colonoscopy in 5 years for surveillance. Procedure Code(s):     --- Professional ---                        [phone], Colonoscopy, flexible; diagnostic, including                         collection of specimen(s) by brushing or washing, when                         performed (separate procedure) Diagnosis Code(s):     --- Professional ---                        Z86.010, Personal history of colonic polyps CPT copyright 2019 American Medical Association. All rights reserved. The codes documented in this report are preliminary and upon coder review may  be revised to meet current compliance requirements. Garwood Wentzell  Ailish Prospero MD, MD 05/26/2021 7:46:36 AM This report has been signed electronically. Number of Addenda: 0 Note Initiated On: 05/26/2021 7:12 AM Scope Withdrawal Time: 0 hours 7 minutes 37 seconds  Total Procedure Duration: 0 hours 9 minutes 53 seconds  Estimated Blood Loss:  Estimated blood loss: none.      [facility]

## 2021-05-26 NOTE — Anesthesia Procedure Notes (Signed)
Procedure Name: MAC Date/Time: 05/26/2021 7:41 AM Performed by: Jeannene Patella, CRNA Pre-anesthesia Checklist: Patient identified, Emergency Drugs available, Suction available, Timeout performed and Patient being monitored Patient Re-evaluated:Patient Re-evaluated prior to induction Oxygen Delivery Method: Nasal cannula Placement Confirmation: positive ETCO2

## 2021-05-26 NOTE — H&P (Signed)
Vanessa Lame, MD [address] [place],  [address] Phone:[phone] Fax : [phone]  Primary Care Physician:  Valerie Roys, DO Primary Gastroenterologist:  Dr. Allen Norris  Pre-Procedure History & Physical: HPI:  Vanessa Harvey is a 61 y.o. female is here for an colonoscopy.   Past Medical History:  Diagnosis Date   Allergic rhinitis, cause unspecified    Anxiety state 12/22/2008   Qualifier: Diagnosis of  By: Glori Bickers MD, Carmell Austria    Anxiety state, unspecified    Asthma    Breast discharge 12/12/2016   right   Degenerative disc disease, lumbar    Depressive disorder, not elsewhere classified    Family history of stroke 12/12/2016   GERD (gastroesophageal reflux disease)    Low HDL (under 40) 10/25/2017   Lumbago    Numbness in right leg 07/06/2015   Other atopic dermatitis and related conditions    Other B-complex deficiencies    Panic disorder without agoraphobia    Problems with swallowing and mastication    Right facial numbness 11/29/2015   Submandibular lymphadenopathy 06/29/2013   Unspecified asthma(493.90)     Past Surgical History:  Procedure Laterality Date   Abd [place]  09/2005   Negative   CESAREAN SECTION  1993   DEXA  06/2004   ESOPHAGOGASTRODUODENOSCOPY (EGD) WITH PROPOFOL N/A 04/08/2017   Procedure: ESOPHAGOGASTRODUODENOSCOPY (EGD) WITH PROPOFOL;  Surgeon: Vanessa Lame, MD;  Location: [facility];  Service: Endoscopy;  Laterality: N/A;   KNEE ARTHROSCOPY  1984   evulsion of femur s/p fall    Prior to Admission medications   Medication Sig Start Date End Date Taking? Authorizing Provider  albuterol (PROVENTIL HFA) 108 (90 Base) MCG/ACT inhaler Inhale 2 puffs into the lungs every 4 (four) hours as needed. 04/21/21  Yes Johnson, Megan P, DO  ALPRAZolam (XANAX) 0.5 MG tablet Take 0.5-1 tablets (0.25-0.5 mg total) by mouth 2 (two) times daily as needed for anxiety. For severe anxiety 04/21/21  Yes Johnson, Megan P, DO  Cyanocobalamin  (VITAMIN B-12) 1000 MCG SUBL Place 1 tablet (1,000 mcg total) under the tongue daily. 09/16/20  Yes Johnson, Megan P, DO  esomeprazole (NEXIUM) 20 MG capsule Take 1 capsule (20 mg total) by mouth daily as needed. 12/12/16  Yes Lada, Satira Anis, MD  FLUoxetine (PROZAC) 20 MG capsule Take 1 capsule (20 mg total) by mouth daily. 04/21/21  Yes Johnson, Megan P, DO  fluticasone (FLONASE) 50 MCG/ACT nasal spray Place 2 sprays into both nostrils daily. 09/16/20  Yes Johnson, Megan P, DO  lisinopril (ZESTRIL) 10 MG tablet Take 1 tablet (10 mg total) by mouth daily. 04/21/21  Yes Johnson, Megan P, DO  Misc Natural Products (ELDERBERRY ZINC/VIT C/IMMUNE MT) Use as directed in the mouth or throat.   Yes [provider]  montelukast (SINGULAIR) 10 MG tablet TAKE 1 TABLET(10 MG) BY MOUTH DAILY 04/21/21  Yes Johnson, Megan P, DO  Multiple Vitamin (MULTIVITAMIN) tablet Take 1 tablet by mouth daily.   Yes [provider]    Allergies as of 04/18/2021 - Review Complete 09/16/2020  Allergen Reaction Noted   Penicillins      Family History  Problem Relation Age of Onset   Hypertension Father        Mild   Ulcerative colitis Father    Cancer Father        esophageal and colon   Stroke Father    COPD Father    Arthritis Mother    Diabetes  Brother    Diabetes Paternal Grandmother    Arthritis Brother    Heart disease Neg Hx    Breast cancer Neg Hx     Social History   Socioeconomic History   Marital status: Married    Spouse name: Not on file   Number of children: 2   Years of education: Not on file   Highest education level: Not on file  Occupational History   Occupation: Art therapist  Tobacco Use   Smoking status: Never   Smokeless tobacco: Never  Vaping Use   Vaping Use: Never used  Substance and Sexual Activity   Alcohol use: Yes    Comment: Rare   Drug use: No   Sexual activity: Yes    Birth control/protection: Post-menopausal  Other Topics Concern   Not on file   Social History Narrative   Exercise-yes   Social Determinants of Health   Financial Resource Strain: Not on file  Food Insecurity: Not on file  Transportation Needs: Not on file  Physical Activity: Not on file  Stress: Not on file  Social Connections: Not on file  Intimate Partner Violence: Not on file    Review of Systems: See HPI, otherwise negative ROS  Physical Exam: BP (!) 175/60    Pulse 83    Temp 97.7 F (36.5 C) (Temporal)    Ht 5\' 5"  (1.651 m)    Wt 100.8 kg    LMP 05/07/2009    SpO2 97%    BMI 36.98 kg/m  General:   Alert,  pleasant and cooperative in NAD Head:  Normocephalic and atraumatic. Neck:  Supple; no masses or thyromegaly. Lungs:  Clear throughout to auscultation.    Heart:  Regular rate and rhythm. Abdomen:  Soft, nontender and nondistended. Normal bowel sounds, without guarding, and without rebound.   Neurologic:  Alert and  oriented x4;  grossly normal neurologically.  Impression/Plan: Vanessa Harvey is here for an colonoscopy to be performed for a history of adenomatous polyps with last colonoscopy in 2016  Risks, benefits, limitations, and alternatives regarding  colonoscopy have been reviewed with the patient.  Questions have been answered.  All parties agreeable.   Vanessa Lame, MD  05/26/2021, 7:24 AM

## 2021-05-26 NOTE — Anesthesia Postprocedure Evaluation (Signed)
Anesthesia Post Note  Patient: Vanessa Harvey  Procedure(s) Performed: COLONOSCOPY WITH PROPOFOL (Rectum)     Patient location during evaluation: PACU Anesthesia Type: General Level of consciousness: awake and alert and oriented Pain management: satisfactory to patient Vital Signs Assessment: post-procedure vital signs reviewed and stable Respiratory status: spontaneous breathing, nonlabored ventilation and respiratory function stable Cardiovascular status: blood pressure returned to baseline and stable Postop Assessment: Adequate PO intake and No signs of nausea or vomiting Anesthetic complications: no   No notable events documented.  Raliegh Ip

## 2021-05-29 ENCOUNTER — Encounter: Payer: Self-pay | Admitting: Gastroenterology

## 2021-06-07 HISTORY — PX: CATARACT EXTRACTION: SUR2

## 2021-06-21 ENCOUNTER — Other Ambulatory Visit: Payer: Self-pay | Admitting: Family Medicine

## 2021-06-22 ENCOUNTER — Encounter: Payer: Self-pay | Admitting: *Deleted

## 2021-06-22 NOTE — Telephone Encounter (Signed)
Patient called to report pharmacy would not dispense medications. Called pharmacy to clarify when last refill received. Long wait time greater than 5 minutes. Unable to continue to wait. Last refill documented as 04/21/21. Patient reports she has been out of medication x 2 days for fluoxetine 20 mg and montelukast 10 mg . Please advise if refills can be given.

## 2021-06-22 NOTE — Telephone Encounter (Signed)
Both refilled 04/21/2021 #90 1 refill - enough to last until 10/20/2021. Requested Prescriptions  Pending Prescriptions Disp Refills   montelukast (SINGULAIR) 10 MG tablet [Pharmacy Med Name: MONTELUKAST 10MG  TABLETS] 90 tablet 1    Sig: TAKE 1 TABLET(10 MG) BY MOUTH DAILY     Pulmonology:  Leukotriene Inhibitors Passed - 06/21/2021  9:28 PM      Passed - Valid encounter within last 12 months    Recent Outpatient Visits          2 months ago Benign hypertensive renal disease   [facility] [place], Megan P, DO   9 months ago Routine general medical examination at a health care facility   [facility], [place], DO   1 year ago Acute right-sided low back pain with right-sided sciatica   [place], Megan P, DO   1 year ago Acute right-sided low back pain without sciatica   [place], Megan P, DO   1 year ago Mild intermittent asthma with acute exacerbation   [facility] [place], Megan P, DO      Future Appointments            In 4 months Johnson, Megan P, DO [facility]            FLUoxetine (PROZAC) 20 MG capsule [Pharmacy Med Name: FLUOXETINE 20MG  CAPSULES] 90 capsule 1    Sig: TAKE 1 CAPSULE(20 MG) BY MOUTH DAILY     Psychiatry:  Antidepressants - SSRI Passed - 06/21/2021  9:28 PM      Passed - Completed PHQ-2 or PHQ-9 in the last 360 days      Passed - Valid encounter within last 6 months    Recent Outpatient Visits          2 months ago Benign hypertensive renal disease   [facility] [place], Megan P, DO   9 months ago Routine general medical examination at a health care facility   [facility], [place], DO   1 year ago Acute right-sided low back pain with right-sided sciatica   [place], Megan P, DO   1 year ago Acute right-sided low back pain without sciatica   [place], Megan P,  DO   1 year ago Mild intermittent asthma with acute exacerbation   [facility] Valerie Roys, DO      Future Appointments            In 4 months Wynetta Emery, Barb Merino, DO [facility]

## 2021-06-22 NOTE — Telephone Encounter (Signed)
This encounter was created in error - please disregard.

## 2021-10-20 ENCOUNTER — Encounter: Payer: Self-pay | Admitting: Family Medicine

## 2021-10-31 ENCOUNTER — Ambulatory Visit (INDEPENDENT_AMBULATORY_CARE_PROVIDER_SITE_OTHER): Payer: Self-pay | Admitting: Family Medicine

## 2021-10-31 ENCOUNTER — Encounter: Payer: Self-pay | Admitting: Family Medicine

## 2021-10-31 VITALS — BP 126/74 | HR 79 | Temp 98.0°F | Ht 65.5 in | Wt 227.0 lb

## 2021-10-31 DIAGNOSIS — F3342 Major depressive disorder, recurrent, in full remission: Secondary | ICD-10-CM

## 2021-10-31 DIAGNOSIS — Z1231 Encounter for screening mammogram for malignant neoplasm of breast: Secondary | ICD-10-CM

## 2021-10-31 DIAGNOSIS — Z0000 Encounter for general adult medical examination without abnormal findings: Secondary | ICD-10-CM

## 2021-10-31 DIAGNOSIS — N182 Chronic kidney disease, stage 2 (mild): Secondary | ICD-10-CM

## 2021-10-31 DIAGNOSIS — J4521 Mild intermittent asthma with (acute) exacerbation: Secondary | ICD-10-CM

## 2021-10-31 DIAGNOSIS — I129 Hypertensive chronic kidney disease with stage 1 through stage 4 chronic kidney disease, or unspecified chronic kidney disease: Secondary | ICD-10-CM

## 2021-10-31 DIAGNOSIS — F410 Panic disorder [episodic paroxysmal anxiety] without agoraphobia: Secondary | ICD-10-CM

## 2021-10-31 DIAGNOSIS — E538 Deficiency of other specified B group vitamins: Secondary | ICD-10-CM

## 2021-10-31 LAB — URINALYSIS, ROUTINE W REFLEX MICROSCOPIC
Bilirubin, UA: NEGATIVE
Glucose, UA: NEGATIVE
Ketones, UA: NEGATIVE
Leukocytes,UA: NEGATIVE
Nitrite, UA: NEGATIVE
Protein,UA: NEGATIVE
Specific Gravity, UA: >1.03 — ABNORMAL HIGH (ref 1.005–1.030)
Urobilinogen, Ur: 0.2 mg/dL (ref 0.2–1.0)
pH, UA: 5.5 (ref 5.0–7.5)

## 2021-10-31 LAB — MICROSCOPIC EXAMINATION
Bacteria, UA: NONE SEEN
Epithelial Cells (non renal): NONE SEEN /hpf (ref 0–10)
WBC, UA: NONE SEEN /hpf (ref 0–5)

## 2021-10-31 LAB — MICROALBUMIN, URINE WAIVED
Creatinine, Urine Waived: 200 mg/dL (ref 10–300)
Microalb, Ur Waived: 10 mg/L (ref 0–19)
Microalb/Creat Ratio: <30 mg/g (ref <30)

## 2021-10-31 MED ORDER — MONTELUKAST SODIUM 10 MG PO TABS: ORAL_TABLET | ORAL | 1 refills | Status: DC

## 2021-10-31 MED ORDER — LISINOPRIL 10 MG PO TABS: 10.0000 mg | ORAL_TABLET | Freq: Every day | ORAL | 1 refills | Status: DC

## 2021-10-31 MED ORDER — FLUOXETINE HCL 20 MG PO CAPS: 20.0000 mg | ORAL_CAPSULE | Freq: Every day | ORAL | 1 refills | Status: DC

## 2021-10-31 NOTE — Assessment & Plan Note (Signed)
Rechecking labs today. Await results. Treat as needed.  °

## 2021-10-31 NOTE — Assessment & Plan Note (Signed)
Doing well on occasional albuterol. Continue to monitor. Does not need refills at this time.

## 2021-11-01 LAB — COMPREHENSIVE METABOLIC PANEL
ALT: 17 IU/L (ref 0–32)
AST: 21 IU/L (ref 0–40)
Albumin/Globulin Ratio: 1.6 (ref 1.2–2.2)
Albumin: 4.3 g/dL (ref 3.8–4.8)
Alkaline Phosphatase: 100 IU/L (ref 44–121)
BUN/Creatinine Ratio: 17 (ref 12–28)
BUN: 15 mg/dL (ref 8–27)
Bilirubin Total: <0.2 mg/dL (ref 0.0–1.2)
CO2: 22 mmol/L (ref 20–29)
Calcium: 9.3 mg/dL (ref 8.7–10.3)
Chloride: 103 mmol/L (ref 96–106)
Creatinine, Ser: 0.88 mg/dL (ref 0.57–1.00)
Globulin, Total: 2.7 g/dL (ref 1.5–4.5)
Glucose: 113 mg/dL — ABNORMAL HIGH (ref 70–99)
Potassium: 4.4 mmol/L (ref 3.5–5.2)
Sodium: 140 mmol/L (ref 134–144)
Total Protein: 7 g/dL (ref 6.0–8.5)
eGFR: 74 mL/min/{1.73_m2} (ref >59)

## 2021-11-01 LAB — CBC WITH DIFFERENTIAL/PLATELET
Basophils Absolute: 0.1 10*3/uL (ref 0.0–0.2)
Basos: 1 %
EOS (ABSOLUTE): 0.2 10*3/uL (ref 0.0–0.4)
Eos: 2 %
Hematocrit: 34.9 % (ref 34.0–46.6)
Hemoglobin: 11.5 g/dL (ref 11.1–15.9)
Immature Grans (Abs): 0 10*3/uL (ref 0.0–0.1)
Immature Granulocytes: 1 %
Lymphocytes Absolute: 2.4 10*3/uL (ref 0.7–3.1)
Lymphs: 30 %
MCH: 26.1 pg — ABNORMAL LOW (ref 26.6–33.0)
MCHC: 33 g/dL (ref 31.5–35.7)
MCV: 79 fL (ref 79–97)
Monocytes Absolute: 0.6 10*3/uL (ref 0.1–0.9)
Monocytes: 8 %
Neutrophils Absolute: 4.6 10*3/uL (ref 1.4–7.0)
Neutrophils: 58 %
Platelets: 404 10*3/uL (ref 150–450)
RBC: 4.4 x10E6/uL (ref 3.77–5.28)
RDW: 14.2 % (ref 11.7–15.4)
WBC: 7.9 10*3/uL (ref 3.4–10.8)

## 2021-11-01 LAB — TSH: TSH: 1.44 u[IU]/mL (ref 0.450–4.500)

## 2021-11-01 LAB — LIPID PANEL W/O CHOL/HDL RATIO
Cholesterol, Total: 182 mg/dL (ref 100–199)
HDL: 42 mg/dL (ref >39)
LDL Chol Calc (NIH): 114 mg/dL — ABNORMAL HIGH (ref 0–99)
Triglycerides: 147 mg/dL (ref 0–149)
VLDL Cholesterol Cal: 26 mg/dL (ref 5–40)

## 2021-11-01 LAB — VITAMIN B12: Vitamin B-12: 801 pg/mL (ref 232–1245)

## 2022-05-09 ENCOUNTER — Encounter: Payer: Self-pay | Admitting: Family Medicine

## 2022-05-09 ENCOUNTER — Ambulatory Visit (INDEPENDENT_AMBULATORY_CARE_PROVIDER_SITE_OTHER): Payer: Self-pay | Admitting: Family Medicine

## 2022-05-09 VITALS — BP 124/74 | HR 76 | Temp 99.2°F | Ht 65.5 in | Wt 227.0 lb

## 2022-05-09 DIAGNOSIS — I129 Hypertensive chronic kidney disease with stage 1 through stage 4 chronic kidney disease, or unspecified chronic kidney disease: Secondary | ICD-10-CM

## 2022-05-09 DIAGNOSIS — F3342 Major depressive disorder, recurrent, in full remission: Secondary | ICD-10-CM

## 2022-05-09 DIAGNOSIS — F410 Panic disorder [episodic paroxysmal anxiety] without agoraphobia: Secondary | ICD-10-CM

## 2022-05-09 MED ORDER — LISINOPRIL 10 MG PO TABS: 10.0000 mg | ORAL_TABLET | Freq: Every day | ORAL | 1 refills | Status: DC

## 2022-05-09 MED ORDER — MONTELUKAST SODIUM 10 MG PO TABS: ORAL_TABLET | ORAL | 1 refills | Status: DC

## 2022-05-09 MED ORDER — FLUOXETINE HCL 20 MG PO CAPS: 20.0000 mg | ORAL_CAPSULE | Freq: Every day | ORAL | 1 refills | Status: DC

## 2022-05-09 NOTE — Progress Notes (Signed)
BP 124/74   Pulse 76   Temp 99.2 F (37.3 C) (Oral)   Ht 5' 5.5" (1.664 m)   Wt 227 lb (103 kg)   LMP 05/07/2009   SpO2 97%   BMI 37.20 kg/m    Subjective:    Patient ID: Vanessa Harvey, female    DOB: 08/24/1960, 62 y.o.   MRN: [id number]  HPI: Vanessa Harvey is a 62 y.o. female  Chief Complaint  Patient presents with   Hypertension   Depression   HYPERTENSION  Hypertension status: controlled  Satisfied with current treatment? yes Duration of hypertension: chronic BP monitoring frequency:  not checking BP medication side effects:  no Medication compliance: excellent compliance Previous BP meds:lisinopril Aspirin: no Recurrent headaches: no Visual changes: no Palpitations: no Dyspnea: no Chest pain: no Lower extremity edema: no Dizzy/lightheaded: no  ANXIETY/STRESS- still has 15 pills of her xanax left. Does not need refill today. Duration: chronic Status:controlled Anxious mood: no  Excessive worrying: no Irritability: no  Sweating: no Nausea: no Palpitations:no Hyperventilation: no Panic attacks: no Agoraphobia: no  Obscessions/compulsions: no Depressed mood: no    05/09/2022   11:24 AM 10/31/2021   11:19 AM 04/21/2021    9:29 AM 09/16/2020    9:13 AM 02/24/2020   10:40 AM  Depression screen PHQ 2/9  Decreased Interest 0 0 0 0 0  Down, Depressed, Hopeless 0 0 0 0 0  PHQ - 2 Score 0 0 0 0 0  Altered sleeping 0 0 0 0 0  Tired, decreased energy 1 0 1 1 0  Change in appetite _0 0  Feeling bad or failure about yourself  0 0 0 0 0  Trouble concentrating 0 0 1 0 0  Moving slowly or fidgety/restless 0 0 0 0 0  Suicidal thoughts 0 0 0 0 0  PHQ-9 Score 2 0 3 2 0  Difficult doing work/chores Not difficult at all Not difficult at all  Not difficult at all Not difficult at all      05/09/2022   11:24 AM 10/31/2021   11:20 AM 04/21/2021    9:29 AM 02/24/2020   10:40 AM  GAD 7 : Generalized Anxiety Score  Nervous, Anxious, on Edge 0 0 1 0   Control/stop worrying 0 0 0 0  Worry too much - different things 0 0 0 0  Trouble relaxing 0 0 0 0  Restless 0 0 0 0  Easily annoyed or irritable 0 0 1 0  Afraid - awful might happen 0 0 0 0  Total GAD 7 Score 0 0 2 0  Anxiety Difficulty  Not difficult at all Somewhat difficult Not difficult at all   Anhedonia: no Weight changes: no Insomnia: no   Hypersomnia: no Fatigue/loss of energy: no Feelings of worthlessness: no Feelings of guilt: no Impaired concentration/indecisiveness: no Suicidal ideations: no  Crying spells: no Recent Stressors/Life Changes: no   Relationship problems: no   Family stress: no     Financial stress: no    Job stress: no    Recent death/loss: no  Relevant past medical, surgical, family and social history reviewed and updated as indicated. Interim medical history since our last visit reviewed. Allergies and medications reviewed and updated.  Review of Systems  Constitutional: Negative.   Respiratory: Negative.    Cardiovascular: Negative.   Gastrointestinal: Negative.   Musculoskeletal: Negative.   Neurological: Negative.   Psychiatric/Behavioral: Negative.      Per HPI unless  specifically indicated above     Objective:    BP 124/74   Pulse 76   Temp 99.2 F (37.3 C) (Oral)   Ht 5' 5.5" (1.664 m)   Wt 227 lb (103 kg)   LMP 05/07/2009   SpO2 97%   BMI 37.20 kg/m   Wt Readings from Last 3 Encounters:  05/09/22 227 lb (103 kg)  10/31/21 227 lb (103 kg)  05/26/21 222 lb 3.2 oz (100.8 kg)    Physical Exam Vitals and nursing note reviewed.  Constitutional:      General: She is not in acute distress.    Appearance: Normal appearance. She is not ill-appearing, toxic-appearing or diaphoretic.  HENT:     Head: Normocephalic and atraumatic.     Right Ear: External ear normal.     Left Ear: External ear normal.     Nose: Nose normal.     Mouth/Throat:     Mouth: Mucous membranes are moist.     Pharynx: Oropharynx is clear.  Eyes:      General: No scleral icterus.       Right eye: No discharge.        Left eye: No discharge.     Extraocular Movements: Extraocular movements intact.     Conjunctiva/sclera: Conjunctivae normal.     Pupils: Pupils are equal, round, and reactive to light.  Cardiovascular:     Rate and Rhythm: Normal rate and regular rhythm.     Pulses: Normal pulses.     Heart sounds: Normal heart sounds. No murmur heard.    No friction rub. No gallop.  Pulmonary:     Effort: Pulmonary effort is normal. No respiratory distress.     Breath sounds: Normal breath sounds. No stridor. No wheezing, rhonchi or rales.  Chest:     Chest wall: No tenderness.  Musculoskeletal:        General: Normal range of motion.     Cervical back: Normal range of motion and neck supple.  Skin:    General: Skin is warm and dry.     Capillary Refill: Capillary refill takes less than 2 seconds.     Coloration: Skin is not jaundiced or pale.     Findings: No bruising, erythema, lesion or rash.  Neurological:     General: No focal deficit present.     Mental Status: She is alert and oriented to person, place, and time. Mental status is at baseline.  Psychiatric:        Mood and Affect: Mood normal.        Behavior: Behavior normal.        Thought Content: Thought content normal.        Judgment: Judgment normal.     Results for orders placed or performed in visit on 10/31/21  Microscopic Examination   Urine  Result Value Ref Range   WBC, UA None seen 0 - 5 /hpf   RBC, Urine 0-2 0 - 2 /hpf   Epithelial Cells (non renal) None seen 0 - 10 /hpf   Bacteria, UA None seen None seen/Few  CBC with Differential/Platelet  Result Value Ref Range   WBC 7.9 3.4 - 10.8 x10E3/uL   RBC 4.40 3.77 - 5.28 x10E6/uL   Hemoglobin 11.5 11.1 - 15.9 g/dL   Hematocrit 34.9 34.0 - 46.6 %   MCV 79 79 - 97 fL   MCH 26.1 (L) 26.6 - 33.0 pg   MCHC 33.0 31.5 - 35.7 g/dL   RDW  14.2 11.7 - 15.4 %   Platelets 404 150 - 450 x10E3/uL    Neutrophils 58 Not Estab. %   Lymphs 30 Not Estab. %   Monocytes 8 Not Estab. %   Eos 2 Not Estab. %   Basos 1 Not Estab. %   Neutrophils Absolute 4.6 1.4 - 7.0 x10E3/uL   Lymphocytes Absolute 2.4 0.7 - 3.1 x10E3/uL   Monocytes Absolute 0.6 0.1 - 0.9 x10E3/uL   EOS (ABSOLUTE) 0.2 0.0 - 0.4 x10E3/uL   Basophils Absolute 0.1 0.0 - 0.2 x10E3/uL   Immature Granulocytes 1 Not Estab. %   Immature Grans (Abs) 0.0 0.0 - 0.1 x10E3/uL  Comprehensive metabolic panel  Result Value Ref Range   Glucose 113 (H) 70 - 99 mg/dL   BUN 15 8 - 27 mg/dL   Creatinine, Ser 0.88 0.57 - 1.00 mg/dL   eGFR 74 >59 mL/min/1.73   BUN/Creatinine Ratio 17 12 - 28   Sodium 140 134 - 144 mmol/L   Potassium 4.4 3.5 - 5.2 mmol/L   Chloride 103 96 - 106 mmol/L   CO2 22 20 - 29 mmol/L   Calcium 9.3 8.7 - 10.3 mg/dL   Total Protein 7.0 6.0 - 8.5 g/dL   Albumin 4.3 3.8 - 4.8 g/dL   Globulin, Total 2.7 1.5 - 4.5 g/dL   Albumin/Globulin Ratio 1.6 1.2 - 2.2   Bilirubin Total <0.2 0.0 - 1.2 mg/dL   Alkaline Phosphatase 100 44 - 121 IU/L   AST 21 0 - 40 IU/L   ALT 17 0 - 32 IU/L  Lipid Panel w/o Chol/HDL Ratio  Result Value Ref Range   Cholesterol, Total 182 100 - 199 mg/dL   Triglycerides 147 0 - 149 mg/dL   HDL 42 >39 mg/dL   VLDL Cholesterol Cal 26 5 - 40 mg/dL   LDL Chol Calc (NIH) 114 (H) 0 - 99 mg/dL  Urinalysis, Routine w reflex microscopic  Result Value Ref Range   Specific Gravity, UA >1.030 (H) 1.005 - 1.030   pH, UA 5.5 5.0 - 7.5   Color, UA Yellow Yellow   Appearance Ur Clear Clear   Leukocytes,UA Negative Negative   Protein,UA Negative Negative/Trace   Glucose, UA Negative Negative   Ketones, UA Negative Negative   RBC, UA Trace (A) Negative   Bilirubin, UA Negative Negative   Urobilinogen, Ur 0.2 0.2 - 1.0 mg/dL   Nitrite, UA Negative Negative   Microscopic Examination See below:   TSH  Result Value Ref Range   TSH 1.440 0.450 - 4.500 uIU/mL  Microalbumin, Urine Waived  Result Value Ref  Range   Microalb, Ur Waived 10 0 - 19 mg/L   Creatinine, Urine Waived 200 10 - 300 mg/dL   Microalb/Creat Ratio <30 <30 mg/g  B12  Result Value Ref Range   Vitamin B-12 801 232 - 1,245 pg/mL      Assessment & Plan:   Problem List Items Addressed This Visit       Genitourinary   Benign hypertensive renal disease - Primary    Under good control on current regimen. Continue current regimen. Continue to monitor. Call with any concerns. Refills given. Labs drawn today.       Relevant Orders   Basic metabolic panel     Other   PANIC DISORDER    Does not need refill of her xanax now- still has 15 pills left of last Rx. Will call/mychart if she needs new rx before next visit.  Relevant Medications   FLUoxetine (PROZAC) 20 MG capsule   Major depression, recurrent, full remission ([place])    Under good control on current regimen. Continue current regimen. Continue to monitor. Call with any concerns. Refills given.        Relevant Medications   FLUoxetine (PROZAC) 20 MG capsule     Follow up plan: Return in about 6 months (around 11/07/2022) for physical.

## 2022-05-09 NOTE — Assessment & Plan Note (Signed)
Does not need refill of her xanax now- still has 15 pills left of last Rx. Will call/mychart if she needs new rx before next visit.

## 2022-05-09 NOTE — Assessment & Plan Note (Signed)
Under good control on current regimen. Continue current regimen. Continue to monitor. Call with any concerns. Refills given.   

## 2022-05-09 NOTE — Assessment & Plan Note (Signed)
Under good control on current regimen. Continue current regimen. Continue to monitor. Call with any concerns. Refills given. Labs drawn today.   

## 2022-05-10 LAB — BASIC METABOLIC PANEL
BUN/Creatinine Ratio: 18 (ref 12–28)
BUN: 14 mg/dL (ref 8–27)
CO2: 25 mmol/L (ref 20–29)
Calcium: 9.4 mg/dL (ref 8.7–10.3)
Chloride: 104 mmol/L (ref 96–106)
Creatinine, Ser: 0.79 mg/dL (ref 0.57–1.00)
Glucose: 92 mg/dL (ref 70–99)
Potassium: 4.5 mmol/L (ref 3.5–5.2)
Sodium: 142 mmol/L (ref 134–144)
eGFR: 84 mL/min/{1.73_m2} (ref >59)

## 2022-11-07 ENCOUNTER — Encounter: Payer: Self-pay | Admitting: Family Medicine

## 2022-11-07 ENCOUNTER — Ambulatory Visit (INDEPENDENT_AMBULATORY_CARE_PROVIDER_SITE_OTHER): Payer: Self-pay | Admitting: Family Medicine

## 2022-11-07 VITALS — BP 118/73 | HR 69 | Temp 97.9°F | Ht 65.35 in | Wt 225.6 lb

## 2022-11-07 DIAGNOSIS — F410 Panic disorder [episodic paroxysmal anxiety] without agoraphobia: Secondary | ICD-10-CM

## 2022-11-07 DIAGNOSIS — E538 Deficiency of other specified B group vitamins: Secondary | ICD-10-CM

## 2022-11-07 DIAGNOSIS — F3342 Major depressive disorder, recurrent, in full remission: Secondary | ICD-10-CM

## 2022-11-07 DIAGNOSIS — I129 Hypertensive chronic kidney disease with stage 1 through stage 4 chronic kidney disease, or unspecified chronic kidney disease: Secondary | ICD-10-CM

## 2022-11-07 DIAGNOSIS — Z0000 Encounter for general adult medical examination without abnormal findings: Secondary | ICD-10-CM

## 2022-11-07 DIAGNOSIS — Z1231 Encounter for screening mammogram for malignant neoplasm of breast: Secondary | ICD-10-CM

## 2022-11-07 DIAGNOSIS — Z13220 Encounter for screening for lipoid disorders: Secondary | ICD-10-CM

## 2022-11-07 DIAGNOSIS — R3121 Asymptomatic microscopic hematuria: Secondary | ICD-10-CM

## 2022-11-07 LAB — URINALYSIS, ROUTINE W REFLEX MICROSCOPIC
Bilirubin, UA: NEGATIVE
Glucose, UA: NEGATIVE
Ketones, UA: NEGATIVE
Leukocytes,UA: NEGATIVE
Nitrite, UA: NEGATIVE
Protein,UA: NEGATIVE
Specific Gravity, UA: 1.01 (ref 1.005–1.030)
Urobilinogen, Ur: 0.2 mg/dL (ref 0.2–1.0)
pH, UA: 6 (ref 5.0–7.5)

## 2022-11-07 LAB — MICROALBUMIN, URINE WAIVED
Creatinine, Urine Waived: 50 mg/dL (ref 10–300)
Microalb, Ur Waived: 10 mg/L (ref 0–19)

## 2022-11-07 LAB — MICROSCOPIC EXAMINATION
Bacteria, UA: NONE SEEN
WBC, UA: NONE SEEN /hpf (ref 0–5)

## 2022-11-07 MED ORDER — FLUOXETINE HCL 20 MG PO CAPS: 20.0000 mg | ORAL_CAPSULE | Freq: Every day | ORAL | 1 refills | Status: DC

## 2022-11-07 MED ORDER — MONTELUKAST SODIUM 10 MG PO TABS: ORAL_TABLET | ORAL | 1 refills | Status: DC

## 2022-11-07 MED ORDER — LISINOPRIL 10 MG PO TABS: 10.0000 mg | ORAL_TABLET | Freq: Every day | ORAL | 1 refills | Status: DC

## 2022-11-07 NOTE — Progress Notes (Signed)
BP 118/73   Pulse 69   Temp 97.9 F (36.6 C) (Oral)   Ht 5' 5.35" (1.66 m)   Wt 225 lb 9.6 oz (102.3 kg)   LMP 05/07/2009   SpO2 98%   BMI 37.14 kg/m    Subjective:    Patient ID: Vanessa Harvey, female    DOB: 03/18/1961, 62 y.o.   MRN: [id number]  HPI: Vanessa Harvey is a 62 y.o. female  Chief Complaint  Patient presents with   Annual Exam   HYPERTENSION  Hypertension status: controlled  Satisfied with current treatment? yes Duration of hypertension: chronic BP monitoring frequency:  not checking BP medication side effects:  no Medication compliance: excellent compliance Previous BP meds: lisinopril Aspirin: no Recurrent headaches: no Visual changes: no Palpitations: no Dyspnea: no Chest pain: no Lower extremity edema: no Dizzy/lightheaded: no  DEPRESSION Mood status: controlled Satisfied with current treatment?: yes Symptom severity: mild  Duration of current treatment : chronic Side effects: no Medication compliance: excellent compliance Psychotherapy/counseling: no  Previous psychiatric medications: prozac Depressed mood: no Anxious mood: no Anhedonia: no Significant weight loss or gain: no Insomnia: no  Fatigue: no Feelings of worthlessness or guilt: no Impaired concentration/indecisiveness: no Suicidal ideations: no Hopelessness: no Crying spells: no    11/07/2022    8:29 AM 05/09/2022   11:24 AM 10/31/2021   11:19 AM 04/21/2021    9:29 AM 09/16/2020    9:13 AM  Depression screen PHQ 2/9  Decreased Interest 0 0 0 0 0  Down, Depressed, Hopeless 0 0 0 0 0  PHQ - 2 Score 0 0 0 0 0  Altered sleeping 0 0 0 0 0  Tired, decreased energy 0 1 0 1 1  Change in appetite 0 1  1 1   Feeling bad or failure about yourself  0 0 0 0 0  Trouble concentrating 0 0 0 1 0  Moving slowly or fidgety/restless 0 0 0 0 0  Suicidal thoughts 0 0 0 0 0  PHQ-9 Score 0 2 0 3 2  Difficult doing work/chores Not difficult at all Not difficult at all Not difficult at  all  Not difficult at all     Relevant past medical, surgical, family and social history reviewed and updated as indicated. Interim medical history since our last visit reviewed. Allergies and medications reviewed and updated.  Review of Systems  Constitutional: Negative.   HENT: Negative.    Eyes: Negative.   Respiratory: Negative.    Cardiovascular:  Positive for palpitations. Negative for chest pain and leg swelling.  Gastrointestinal: Negative.        +GERD  Endocrine: Negative.   Genitourinary: Negative.   Musculoskeletal:  Positive for myalgias and neck pain. Negative for arthralgias, back pain, gait problem, joint swelling and neck stiffness.  Skin: Negative.   Allergic/Immunologic: Positive for environmental allergies. Negative for food allergies and immunocompromised state.  Neurological: Negative.   Hematological: Negative.   Psychiatric/Behavioral: Negative.      Per HPI unless specifically indicated above     Objective:    BP 118/73   Pulse 69   Temp 97.9 F (36.6 C) (Oral)   Ht 5' 5.35" (1.66 m)   Wt 225 lb 9.6 oz (102.3 kg)   LMP 05/07/2009   SpO2 98%   BMI 37.14 kg/m   Wt Readings from Last 3 Encounters:  11/07/22 225 lb 9.6 oz (102.3 kg)  05/09/22 227 lb (103 kg)  10/31/21 227 lb (103 kg)  Physical Exam Vitals and nursing note reviewed.  Constitutional:      General: She is not in acute distress.    Appearance: Normal appearance. She is obese. She is not ill-appearing, toxic-appearing or diaphoretic.  HENT:     Head: Normocephalic and atraumatic.     Right Ear: Tympanic membrane, ear canal and external ear normal. There is no impacted cerumen.     Left Ear: Tympanic membrane, ear canal and external ear normal. There is no impacted cerumen.     Nose: Nose normal. No congestion or rhinorrhea.     Mouth/Throat:     Mouth: Mucous membranes are moist.     Pharynx: Oropharynx is clear. No oropharyngeal exudate or posterior oropharyngeal erythema.   Eyes:     General: No scleral icterus.       Right eye: No discharge.        Left eye: No discharge.     Extraocular Movements: Extraocular movements intact.     Conjunctiva/sclera: Conjunctivae normal.     Pupils: Pupils are equal, round, and reactive to light.  Neck:     Vascular: No carotid bruit.  Cardiovascular:     Rate and Rhythm: Normal rate and regular rhythm.     Pulses: Normal pulses.     Heart sounds: No murmur heard.    No friction rub. No gallop.  Pulmonary:     Effort: Pulmonary effort is normal. No respiratory distress.     Breath sounds: Normal breath sounds. No stridor. No wheezing, rhonchi or rales.  Chest:     Chest wall: No tenderness.  Abdominal:     General: Abdomen is flat. Bowel sounds are normal. There is no distension.     Palpations: Abdomen is soft. There is no mass.     Tenderness: There is no abdominal tenderness. There is no right CVA tenderness, left CVA tenderness, guarding or rebound.     Hernia: No hernia is present.  Genitourinary:    Comments: Breast and pelvic exams deferred with shared decision making Musculoskeletal:        General: No swelling, tenderness, deformity or signs of injury. Normal range of motion.     Cervical back: Normal range of motion and neck supple. No rigidity. No muscular tenderness.     Right lower leg: No edema.     Left lower leg: No edema.  Lymphadenopathy:     Cervical: No cervical adenopathy.  Skin:    General: Skin is warm and dry.     Capillary Refill: Capillary refill takes less than 2 seconds.     Coloration: Skin is not jaundiced or pale.     Findings: No bruising, erythema, lesion or rash.  Neurological:     General: No focal deficit present.     Mental Status: She is alert and oriented to person, place, and time. Mental status is at baseline.     Cranial Nerves: No cranial nerve deficit.     Sensory: No sensory deficit.     Motor: No weakness.     Coordination: Coordination normal.     Gait: Gait  normal.     Deep Tendon Reflexes: Reflexes normal.  Psychiatric:        Mood and Affect: Mood normal.        Behavior: Behavior normal.        Thought Content: Thought content normal.        Judgment: Judgment normal.     Results for orders placed or performed  in visit on 11/07/22  Microscopic Examination   Urine  Result Value Ref Range   WBC, UA None seen 0 - 5 /hpf   RBC, Urine 0-2 0 - 2 /hpf   Epithelial Cells (non renal) 0-10 0 - 10 /hpf   Bacteria, UA None seen None seen/Few  CBC with Differential/Platelet  Result Value Ref Range   WBC 8.6 3.4 - 10.8 x10E3/uL   RBC 4.58 3.77 - 5.28 x10E6/uL   Hemoglobin 11.2 11.1 - 15.9 g/dL   Hematocrit 40.9 81.1 - 46.6 %   MCV 80 79 - 97 fL   MCH 24.5 (L) 26.6 - 33.0 pg   MCHC 30.4 (L) 31.5 - 35.7 g/dL   RDW 91.4 78.2 - 95.6 %   Platelets 426 150 - 450 x10E3/uL   Neutrophils 55 Not Estab. %   Lymphs 30 Not Estab. %   Monocytes 10 Not Estab. %   Eos 3 Not Estab. %   Basos 1 Not Estab. %   Neutrophils Absolute 4.8 1.4 - 7.0 x10E3/uL   Lymphocytes Absolute 2.6 0.7 - 3.1 x10E3/uL   Monocytes Absolute 0.9 0.1 - 0.9 x10E3/uL   EOS (ABSOLUTE) 0.3 0.0 - 0.4 x10E3/uL   Basophils Absolute 0.1 0.0 - 0.2 x10E3/uL   Immature Granulocytes 1 Not Estab. %   Immature Grans (Abs) 0.0 0.0 - 0.1 x10E3/uL  Comprehensive metabolic panel  Result Value Ref Range   Glucose 102 (H) 70 - 99 mg/dL   BUN 13 8 - 27 mg/dL   Creatinine, Ser 2.13 0.57 - 1.00 mg/dL   eGFR 79 >08 MV/HQI/6.96   BUN/Creatinine Ratio 16 12 - 28   Sodium 140 134 - 144 mmol/L   Potassium 4.7 3.5 - 5.2 mmol/L   Chloride 102 96 - 106 mmol/L   CO2 26 20 - 29 mmol/L   Calcium 9.1 8.7 - 10.3 mg/dL   Total Protein 6.6 6.0 - 8.5 g/dL   Albumin 4.2 3.9 - 4.9 g/dL   Globulin, Total 2.4 1.5 - 4.5 g/dL   Bilirubin Total 0.2 0.0 - 1.2 mg/dL   Alkaline Phosphatase 104 44 - 121 IU/L   AST 23 0 - 40 IU/L   ALT 18 0 - 32 IU/L  Lipid Panel w/o Chol/HDL Ratio  Result Value Ref Range    Cholesterol, Total 179 100 - 199 mg/dL   Triglycerides 76 0 - 149 mg/dL   HDL 45 >29 mg/dL   VLDL Cholesterol Cal 14 5 - 40 mg/dL   LDL Chol Calc (NIH) 528 (H) 0 - 99 mg/dL  Microalbumin, Urine Waived  Result Value Ref Range   Microalb, Ur Waived 10 0 - 19 mg/L   Creatinine, Urine Waived 50 10 - 300 mg/dL   Microalb/Creat Ratio 30-300 (H) <30 mg/g  Urinalysis, Routine w reflex microscopic  Result Value Ref Range   Specific Gravity, UA 1.010 1.005 - 1.030   pH, UA 6.0 5.0 - 7.5   Color, UA Yellow Yellow   Appearance Ur Clear Clear   Leukocytes,UA Negative Negative   Protein,UA Negative Negative/Trace   Glucose, UA Negative Negative   Ketones, UA Negative Negative   RBC, UA Trace (A) Negative   Bilirubin, UA Negative Negative   Urobilinogen, Ur 0.2 0.2 - 1.0 mg/dL   Nitrite, UA Negative Negative   Microscopic Examination See below:       Assessment & Plan:   Problem List Items Addressed This Visit       Genitourinary   Benign  hypertensive renal disease - Primary    Under good control on current regimen. Continue current regimen. Continue to monitor. Call with any concerns. Refills given. Labs drawn today.        Relevant Orders   Comprehensive metabolic panel (Completed)   Microalbumin, Urine Waived (Completed)     Other   B12 deficiency    Under good control on current regimen. Continue current regimen. Continue to monitor. Call with any concerns. Refills given. Labs drawn today.       Relevant Orders   CBC with Differential/Platelet (Completed)   PANIC DISORDER    Doing well. Does not need refill of alprazolam at this time. Takes it very occasionally. Call with any concerns.       Relevant Medications   FLUoxetine (PROZAC) 20 MG capsule   Major depression, recurrent, full remission (HCC)    Under good control on current regimen. Continue current regimen. Continue to monitor. Call with any concerns. Refills given. Labs drawn today.       Relevant Medications    FLUoxetine (PROZAC) 20 MG capsule   Other Visit Diagnoses     Asymptomatic microscopic hematuria       Checking UA today.   Relevant Orders   Urinalysis, Routine w reflex microscopic (Completed)   Screening for cholesterol level       Labs drawn today. Await results.   Relevant Orders   Lipid Panel w/o Chol/HDL Ratio (Completed)   Encounter for screening mammogram for malignant neoplasm of breast       Will contact low-cost mammogram program to see if she qualifes as she doesn't have any insurance.   Relevant Orders   MM 3D SCREENING MAMMOGRAM BILATERAL BREAST        Follow up plan: Return in about 6 months (around 05/10/2023).

## 2022-11-08 LAB — COMPREHENSIVE METABOLIC PANEL
ALT: 18 IU/L (ref 0–32)
AST: 23 IU/L (ref 0–40)
Albumin: 4.2 g/dL (ref 3.9–4.9)
Alkaline Phosphatase: 104 IU/L (ref 44–121)
BUN/Creatinine Ratio: 16 (ref 12–28)
BUN: 13 mg/dL (ref 8–27)
Bilirubin Total: 0.2 mg/dL (ref 0.0–1.2)
CO2: 26 mmol/L (ref 20–29)
Calcium: 9.1 mg/dL (ref 8.7–10.3)
Chloride: 102 mmol/L (ref 96–106)
Creatinine, Ser: 0.83 mg/dL (ref 0.57–1.00)
Globulin, Total: 2.4 g/dL (ref 1.5–4.5)
Glucose: 102 mg/dL — ABNORMAL HIGH (ref 70–99)
Potassium: 4.7 mmol/L (ref 3.5–5.2)
Sodium: 140 mmol/L (ref 134–144)
Total Protein: 6.6 g/dL (ref 6.0–8.5)
eGFR: 79 mL/min/{1.73_m2} (ref >59)

## 2022-11-08 LAB — CBC WITH DIFFERENTIAL/PLATELET
Basophils Absolute: 0.1 10*3/uL (ref 0.0–0.2)
Basos: 1 %
EOS (ABSOLUTE): 0.3 10*3/uL (ref 0.0–0.4)
Eos: 3 %
Hematocrit: 36.8 % (ref 34.0–46.6)
Hemoglobin: 11.2 g/dL (ref 11.1–15.9)
Immature Grans (Abs): 0 10*3/uL (ref 0.0–0.1)
Immature Granulocytes: 1 %
Lymphocytes Absolute: 2.6 10*3/uL (ref 0.7–3.1)
Lymphs: 30 %
MCH: 24.5 pg — ABNORMAL LOW (ref 26.6–33.0)
MCHC: 30.4 g/dL — ABNORMAL LOW (ref 31.5–35.7)
MCV: 80 fL (ref 79–97)
Monocytes Absolute: 0.9 10*3/uL (ref 0.1–0.9)
Monocytes: 10 %
Neutrophils Absolute: 4.8 10*3/uL (ref 1.4–7.0)
Neutrophils: 55 %
Platelets: 426 10*3/uL (ref 150–450)
RBC: 4.58 x10E6/uL (ref 3.77–5.28)
RDW: 14.7 % (ref 11.7–15.4)
WBC: 8.6 10*3/uL (ref 3.4–10.8)

## 2022-11-08 LAB — LIPID PANEL W/O CHOL/HDL RATIO
Cholesterol, Total: 179 mg/dL (ref 100–199)
HDL: 45 mg/dL (ref >39)
LDL Chol Calc (NIH): 120 mg/dL — ABNORMAL HIGH (ref 0–99)
Triglycerides: 76 mg/dL (ref 0–149)
VLDL Cholesterol Cal: 14 mg/dL (ref 5–40)

## 2022-11-09 ENCOUNTER — Encounter: Payer: Self-pay | Admitting: Family Medicine

## 2022-11-09 NOTE — Assessment & Plan Note (Signed)
Under good control on current regimen. Continue current regimen. Continue to monitor. Call with any concerns. Refills given. Labs drawn today.   

## 2022-11-09 NOTE — Assessment & Plan Note (Signed)
Doing well. Does not need refill of alprazolam at this time. Takes it very occasionally. Call with any concerns.

## 2023-01-01 ENCOUNTER — Other Ambulatory Visit: Payer: Self-pay | Admitting: Family Medicine

## 2023-01-01 ENCOUNTER — Ambulatory Visit: Payer: Self-pay | Admitting: *Deleted

## 2023-01-01 DIAGNOSIS — J4521 Mild intermittent asthma with (acute) exacerbation: Secondary | ICD-10-CM

## 2023-01-01 MED ORDER — ALBUTEROL SULFATE HFA 108 (90 BASE) MCG/ACT IN AERS: 2.0000 | INHALATION_SPRAY | RESPIRATORY_TRACT | 6 refills | Status: DC | PRN

## 2023-01-01 NOTE — Telephone Encounter (Signed)
  Chief Complaint: medication request- patient states her inhaler is out of date, congestion with nasal/chest congestion- hx asthma causing mild SOB Symptoms: nasal/chest congestion, mild SOB- asthma hx- needs inhaler, low grade temp yesterday Frequency: symptoms started Saturday  Pertinent Negatives: Patient denies cough, chest pain Disposition: [] ED /[] Urgent Care (no appt availability in office) / [] Appointment(In office/virtual)/ []  [place] [facility]/ [] Home Care/ [x] Refused Recommended Disposition /[] [facility]/ []  Follow-up with PCP Additional Notes: Patient request inhaler rx- she wants to treat symptoms at home with Mucinex and inhaler to see if she can clear this herself.  Unable to RF inhaler due to 2022 -last filled- will send for provider review/renewal of Rx. Patient is aware to test for COVID and call office for appointment if she should get worse.

## 2023-01-01 NOTE — Telephone Encounter (Signed)
Reason for Disposition  [1] MILD difficulty breathing (e.g., minimal/no SOB at rest, SOB with walking, pulse <100) AND [2] NEW-onset or WORSE than normal  Answer Assessment - Initial Assessment Questions 1. RESPIRATORY STATUS: "Describe your breathing?" (e.g., wheezing, shortness of breath, unable to speak, severe coughing)      A little short of breath- using Musinex and needs inhaler RF 2. ONSET: "When did this breathing problem begin?"      Started Saturday 3. PATTERN "Does the difficult breathing come and go, or has it been constant since it started?"      constant 4. SEVERITY: "How bad is your breathing?" (e.g., mild, moderate, severe)    - MILD: No SOB at rest, mild SOB with walking, speaks normally in sentences, can lie down, no retractions, pulse < 100.    - MODERATE: SOB at rest, SOB with minimal exertion and prefers to sit, cannot lie down flat, speaks in phrases, mild retractions, audible wheezing, pulse 100-120.    - SEVERE: Very SOB at rest, speaks in single words, struggling to breathe, sitting hunched forward, retractions, pulse > 120      mild 5. RECURRENT SYMPTOM: "Have you had difficulty breathing before?" If Yes, ask: "When was the last time?" and "What happened that time?"      Hx asthma 6. CARDIAC HISTORY: "Do you have any history of heart disease?" (e.g., heart attack, angina, bypass surgery, angioplasty)      no 7. LUNG HISTORY: "Do you have any history of lung disease?"  (e.g., pulmonary embolus, asthma, emphysema)     asthma 8. CAUSE: "What do you think is causing the breathing problem?"      Sinus cold 9. OTHER SYMPTOMS: "Do you have any other symptoms? (e.g., dizziness, runny nose, cough, chest pain, fever)     Congestion, - head/chest, lo grade fever- feeling better today 10. O2 SATURATION MONITOR:  "Do you use an oxygen saturation monitor (pulse oximeter) at home?" If Yes, ask: "What is your reading (oxygen level) today?" "What is your usual oxygen saturation  reading?" (e.g., 95%)       96-97%  Protocols used: Breathing Difficulty-A-AH

## 2023-01-01 NOTE — Telephone Encounter (Signed)
Medication Refill - Medication: albuterol (PROVENTIL HFA) 108 (90 Base) MCG/ACT inhaler   Has the patient contacted their pharmacy? Yes.      Preferred Pharmacy (with phone number or street name):  [facility] [id number] [place], [place] - [address]  [address] [place] [address]  Phone: [phone] Fax: [phone]  Hours: Not open 24 hours     Has the patient been seen for an appointment in the last year OR does the patient have an upcoming appointment? Yes.    Agent: Please be advised that RX refills may take up to 3 business days. We ask that you follow-up with your pharmacy.

## 2023-01-01 NOTE — Telephone Encounter (Signed)
Requested medication (s) are due for refill today - expired Rx  Requested medication (s) are on the active medication list -yes  Future visit scheduled -yes  Last refill: 04/21/21 18g 6RF  Notes to clinic: expired Rx- patient needs RF-there is triage note requesting RF for symptoms also  Requested Prescriptions  Pending Prescriptions Disp Refills   albuterol (PROVENTIL HFA) 108 (90 Base) MCG/ACT inhaler 18 g 6    Sig: Inhale 2 puffs into the lungs every 4 (four) hours as needed.     Pulmonology:  [facility] 2 Passed - 01/01/2023  8:38 AM      Passed - Last BP in normal range    BP Readings from Last 1 Encounters:  11/07/22 118/73         Passed - Last Heart Rate in normal range    Pulse Readings from Last 1 Encounters:  11/07/22 69         Passed - Valid encounter within last 12 months    Recent Outpatient Visits           1 month ago Benign hypertensive renal disease   [place] [facility] [place], Megan P, DO   7 months ago Benign hypertensive renal disease   [place] [facility] [place], Megan P, DO   1 year ago Routine general medical examination at a health care facility   [facility] [place], [place] P, DO   1 year ago Benign hypertensive renal disease   [place] [facility] [place], Megan P, DO   2 years ago Routine general medical examination at a health care facility   [facility] [place], [place] P, DO       Future Appointments             In 4 months Laural Benes, Oralia Rud, DO [place] [facility]               Requested Prescriptions  Pending Prescriptions Disp Refills   albuterol (PROVENTIL HFA) 108 (90 Base) MCG/ACT inhaler 18 g 6    Sig: Inhale 2 puffs into the lungs every 4 (four) hours as needed.     Pulmonology:  [facility] 2 Passed - 01/01/2023  8:38 AM      Passed - Last BP in normal range    BP Readings from Last 1  Encounters:  11/07/22 118/73         Passed - Last Heart Rate in normal range    Pulse Readings from Last 1 Encounters:  11/07/22 69         Passed - Valid encounter within last 12 months    Recent Outpatient Visits           1 month ago Benign hypertensive renal disease   [place] [facility] [place], Megan P, DO   7 months ago Benign hypertensive renal disease   [place] [facility] [place], Megan P, DO   1 year ago Routine general medical examination at a health care facility   [facility] [place], [place] P, DO   1 year ago Benign hypertensive renal disease   [place] [facility] [place], Megan P, DO   2 years ago Routine general medical examination at a health care facility   [facility] [place], DO       Future Appointments  In 4 months Laural Benes, Oralia Rud, DO [place] [facility]

## 2023-01-02 NOTE — Telephone Encounter (Signed)
She should have a refill for her albuterol at the pharmacy to pick up. If not getting better, please get her seen

## 2023-01-02 NOTE — Telephone Encounter (Signed)
Patient notified of Dr. Johnson's message.  °

## 2023-05-10 ENCOUNTER — Ambulatory Visit: Payer: Self-pay | Admitting: Family Medicine

## 2023-06-14 ENCOUNTER — Ambulatory Visit (INDEPENDENT_AMBULATORY_CARE_PROVIDER_SITE_OTHER): Payer: Self-pay | Admitting: Family Medicine

## 2023-06-14 ENCOUNTER — Encounter: Payer: Self-pay | Admitting: Family Medicine

## 2023-06-14 VITALS — BP 125/72 | HR 80 | Ht 65.0 in | Wt 228.0 lb

## 2023-06-14 DIAGNOSIS — I129 Hypertensive chronic kidney disease with stage 1 through stage 4 chronic kidney disease, or unspecified chronic kidney disease: Secondary | ICD-10-CM

## 2023-06-14 DIAGNOSIS — J4521 Mild intermittent asthma with (acute) exacerbation: Secondary | ICD-10-CM

## 2023-06-14 DIAGNOSIS — F3342 Major depressive disorder, recurrent, in full remission: Secondary | ICD-10-CM

## 2023-06-14 DIAGNOSIS — E538 Deficiency of other specified B group vitamins: Secondary | ICD-10-CM

## 2023-06-14 DIAGNOSIS — F410 Panic disorder [episodic paroxysmal anxiety] without agoraphobia: Secondary | ICD-10-CM

## 2023-06-14 MED ORDER — MONTELUKAST SODIUM 10 MG PO TABS: ORAL_TABLET | ORAL | 2 refills | Status: DC

## 2023-06-14 MED ORDER — ALPRAZOLAM 0.5 MG PO TABS: 0.2500 mg | ORAL_TABLET | Freq: Two times a day (BID) | ORAL | 0 refills | Status: AC | PRN

## 2023-06-14 MED ORDER — FLUOXETINE HCL 20 MG PO CAPS: 20.0000 mg | ORAL_CAPSULE | Freq: Every day | ORAL | 2 refills | Status: DC

## 2023-06-14 MED ORDER — LISINOPRIL 10 MG PO TABS: 10.0000 mg | ORAL_TABLET | Freq: Every day | ORAL | 2 refills | Status: DC

## 2023-06-14 NOTE — Assessment & Plan Note (Signed)
 Under good control on current regimen. Continue current regimen. Continue to monitor. Call with any concerns. Refills given. Refill of xanax  given, last filled in 2022.

## 2023-06-14 NOTE — Progress Notes (Signed)
 BP 125/72   Pulse 80   Ht 5' 5 (1.651 m)   Wt 228 lb (103.4 kg)   LMP 05/07/2009   SpO2 95%   BMI 37.94 kg/m    Subjective:    Patient ID: Vanessa Harvey Anon, female    DOB: Apr 02, 1961, 63 y.o.   MRN: [id number]  HPI: Vanessa Harvey is a 63 y.o. female  Chief Complaint  Patient presents with   Hypertension   Anxiety   HYPERTENSION  Hypertension status: controlled  Satisfied with current treatment? yes Duration of hypertension: chronic BP monitoring frequency:  not checking BP medication side effects:  no Medication compliance: excellent compliance Previous BP meds: lisinopril  Aspirin: no Recurrent headaches: no Visual changes: no Palpitations: no Dyspnea: no Chest pain: no Lower extremity edema: no Dizzy/lightheaded: no  ANXIETY/STRESS Duration: chronic Status:controlled Anxious mood: no  Excessive worrying: no Irritability: no  Sweating: no Nausea: no Palpitations:no Hyperventilation: no Panic attacks: no Agoraphobia: no  Obscessions/compulsions: no Depressed mood: no    06/14/2023    9:02 AM 11/07/2022    8:29 AM 05/09/2022   11:24 AM 10/31/2021   11:19 AM 04/21/2021    9:29 AM  Depression screen PHQ 2/9  Decreased Interest 0 0 0 0 0  Down, Depressed, Hopeless 0 0 0 0 0  PHQ - 2 Score 0 0 0 0 0  Altered sleeping 0 0 0 0 0  Tired, decreased energy 1 0 1 0 1  Change in appetite 0 0 1  1  Feeling bad or failure about yourself  0 0 0 0 0  Trouble concentrating 0 0 0 0 1  Moving slowly or fidgety/restless 0 0 0 0 0  Suicidal thoughts 0 0 0 0 0  PHQ-9 Score 1 0 2 0 3  Difficult doing work/chores Not difficult at all Not difficult at all Not difficult at all Not difficult at all    Anhedonia: no Weight changes: no Insomnia: no   Hypersomnia: no Fatigue/loss of energy: no Feelings of worthlessness: no Feelings of guilt: no Impaired concentration/indecisiveness: no Suicidal ideations: no  Crying spells: no Recent Stressors/Life Changes: no    Relationship problems: no   Family stress: no     Financial stress: no    Job stress: no    Recent death/loss: no  Has been having pain in her R foot- trying things for plantar fasciitis, but doesn't seem to be getting better.   Relevant past medical, surgical, family and social history reviewed and updated as indicated. Interim medical history since our last visit reviewed. Allergies and medications reviewed and updated.  Review of Systems  Constitutional: Negative.   Respiratory: Negative.    Cardiovascular: Negative.   Gastrointestinal: Negative.   Musculoskeletal: Negative.   Psychiatric/Behavioral: Negative.      Per HPI unless specifically indicated above     Objective:    BP 125/72   Pulse 80   Ht 5' 5 (1.651 m)   Wt 228 lb (103.4 kg)   LMP 05/07/2009   SpO2 95%   BMI 37.94 kg/m   Wt Readings from Last 3 Encounters:  06/14/23 228 lb (103.4 kg)  11/07/22 225 lb 9.6 oz (102.3 kg)  05/09/22 227 lb (103 kg)    Physical Exam Vitals and nursing note reviewed.  Constitutional:      General: She is not in acute distress.    Appearance: Normal appearance. She is not ill-appearing, toxic-appearing or diaphoretic.  HENT:     Head:  Normocephalic and atraumatic.     Right Ear: External ear normal.     Left Ear: External ear normal.     Nose: Nose normal.     Mouth/Throat:     Mouth: Mucous membranes are moist.     Pharynx: Oropharynx is clear.  Eyes:     General: No scleral icterus.       Right eye: No discharge.        Left eye: No discharge.     Extraocular Movements: Extraocular movements intact.     Conjunctiva/sclera: Conjunctivae normal.     Pupils: Pupils are equal, round, and reactive to light.  Cardiovascular:     Rate and Rhythm: Normal rate and regular rhythm.     Pulses: Normal pulses.     Heart sounds: Normal heart sounds. No murmur heard.    No friction rub. No gallop.  Pulmonary:     Effort: Pulmonary effort is normal. No respiratory distress.      Breath sounds: Normal breath sounds. No stridor. No wheezing, rhonchi or rales.  Chest:     Chest wall: No tenderness.  Musculoskeletal:        General: Normal range of motion.     Cervical back: Normal range of motion and neck supple.  Skin:    General: Skin is warm and dry.     Capillary Refill: Capillary refill takes less than 2 seconds.     Coloration: Skin is not jaundiced or pale.     Findings: No bruising, erythema, lesion or rash.  Neurological:     General: No focal deficit present.     Mental Status: She is alert and oriented to person, place, and time. Mental status is at baseline.  Psychiatric:        Mood and Affect: Mood normal.        Behavior: Behavior normal.        Thought Content: Thought content normal.        Judgment: Judgment normal.     Results for orders placed or performed in visit on 11/07/22  CBC with Differential/Platelet   Collection Time: 11/07/22  8:29 AM  Result Value Ref Range   WBC 8.6 3.4 - 10.8 x10E3/uL   RBC 4.58 3.77 - 5.28 x10E6/uL   Hemoglobin 11.2 11.1 - 15.9 g/dL   Hematocrit 63.1 65.9 - 46.6 %   MCV 80 79 - 97 fL   MCH 24.5 (L) 26.6 - 33.0 pg   MCHC 30.4 (L) 31.5 - 35.7 g/dL   RDW 85.2 88.2 - 84.5 %   Platelets 426 150 - 450 x10E3/uL   Neutrophils 55 Not Estab. %   Lymphs 30 Not Estab. %   Monocytes 10 Not Estab. %   Eos 3 Not Estab. %   Basos 1 Not Estab. %   Neutrophils Absolute 4.8 1.4 - 7.0 x10E3/uL   Lymphocytes Absolute 2.6 0.7 - 3.1 x10E3/uL   Monocytes Absolute 0.9 0.1 - 0.9 x10E3/uL   EOS (ABSOLUTE) 0.3 0.0 - 0.4 x10E3/uL   Basophils Absolute 0.1 0.0 - 0.2 x10E3/uL   Immature Granulocytes 1 Not Estab. %   Immature Grans (Abs) 0.0 0.0 - 0.1 x10E3/uL  Comprehensive metabolic panel   Collection Time: 11/07/22  8:29 AM  Result Value Ref Range   Glucose 102 (H) 70 - 99 mg/dL   BUN 13 8 - 27 mg/dL   Creatinine, Ser 9.16 0.57 - 1.00 mg/dL   eGFR 79 >40 fO/fpw/8.26   BUN/Creatinine Ratio 16  12 - 28   Sodium 140  134 - 144 mmol/L   Potassium 4.7 3.5 - 5.2 mmol/L   Chloride 102 96 - 106 mmol/L   CO2 26 20 - 29 mmol/L   Calcium 9.1 8.7 - 10.3 mg/dL   Total Protein 6.6 6.0 - 8.5 g/dL   Albumin 4.2 3.9 - 4.9 g/dL   Globulin, Total 2.4 1.5 - 4.5 g/dL   Bilirubin Total 0.2 0.0 - 1.2 mg/dL   Alkaline Phosphatase 104 44 - 121 IU/L   AST 23 0 - 40 IU/L   ALT 18 0 - 32 IU/L  Lipid Panel w/o Chol/HDL Ratio   Collection Time: 11/07/22  8:29 AM  Result Value Ref Range   Cholesterol, Total 179 100 - 199 mg/dL   Triglycerides 76 0 - 149 mg/dL   HDL 45 >60 mg/dL   VLDL Cholesterol Cal 14 5 - 40 mg/dL   LDL Chol Calc (NIH) 879 (H) 0 - 99 mg/dL  Microscopic Examination   Collection Time: 11/07/22  8:53 AM   Urine  Result Value Ref Range   WBC, UA None seen 0 - 5 /hpf   RBC, Urine 0-2 0 - 2 /hpf   Epithelial Cells (non renal) 0-10 0 - 10 /hpf   Bacteria, UA None seen None seen/Few  Microalbumin, Urine Waived   Collection Time: 11/07/22  8:53 AM  Result Value Ref Range   Microalb, Ur Waived 10 0 - 19 mg/L   Creatinine, Urine Waived 50 10 - 300 mg/dL   Microalb/Creat Ratio 30-300 (H) <30 mg/g  Urinalysis, Routine w reflex microscopic   Collection Time: 11/07/22  8:53 AM  Result Value Ref Range   Specific Gravity, UA 1.010 1.005 - 1.030   pH, UA 6.0 5.0 - 7.5   Color, UA Yellow Yellow   Appearance Ur Clear Clear   Leukocytes,UA Negative Negative   Protein,UA Negative Negative/Trace   Glucose, UA Negative Negative   Ketones, UA Negative Negative   RBC, UA Trace (A) Negative   Bilirubin, UA Negative Negative   Urobilinogen, Ur 0.2 0.2 - 1.0 mg/dL   Nitrite, UA Negative Negative   Microscopic Examination See below:       Assessment & Plan:   Problem List Items Addressed This Visit       Respiratory   Asthma, mild intermittent   Relevant Medications   montelukast  (SINGULAIR ) 10 MG tablet     Genitourinary   Benign hypertensive renal disease   Under good control on current regimen.  Continue current regimen. Continue to monitor. Call with any concerns. Refills given. Labs drawn today.        Relevant Orders   Basic metabolic panel     Other   B12 deficiency - Primary   Rechecking labs today. Call with any concerns. Continue to monitor.       Relevant Orders   CBC with Differential/Platelet   B12   PANIC DISORDER   Under good control on current regimen. Continue current regimen. Continue to monitor. Call with any concerns. Refills given. Refill of xanax  given, last filled in 2022.       Relevant Medications   ALPRAZolam  (XANAX ) 0.5 MG tablet   FLUoxetine  (PROZAC ) 20 MG capsule   Major depression, recurrent, full remission (HCC)   Under good control on current regimen. Continue current regimen. Continue to monitor. Call with any concerns. Refills given      Relevant Medications   ALPRAZolam  (XANAX ) 0.5 MG tablet  FLUoxetine  (PROZAC ) 20 MG capsule     Follow up plan: Return in about 7 months (around 01/12/2024) for Welcome to Medicare.

## 2023-06-14 NOTE — Assessment & Plan Note (Signed)
 Under good control on current regimen. Continue current regimen. Continue to monitor. Call with any concerns. Refills given. Labs drawn today.

## 2023-06-14 NOTE — Assessment & Plan Note (Signed)
Rechecking labs today. Call with any concerns. Continue to monitor.

## 2023-06-14 NOTE — Assessment & Plan Note (Signed)
 Under good control on current regimen. Continue current regimen. Continue to monitor. Call with any concerns. Refills given.

## 2023-06-15 LAB — CBC WITH DIFFERENTIAL/PLATELET
Basophils Absolute: 0 10*3/uL (ref 0.0–0.2)
Basos: 1 %
EOS (ABSOLUTE): 0.2 10*3/uL (ref 0.0–0.4)
Eos: 3 %
Hematocrit: 37.3 % (ref 34.0–46.6)
Hemoglobin: 11.8 g/dL (ref 11.1–15.9)
Immature Grans (Abs): 0 10*3/uL (ref 0.0–0.1)
Immature Granulocytes: 0 %
Lymphocytes Absolute: 2.3 10*3/uL (ref 0.7–3.1)
Lymphs: 30 %
MCH: 25.7 pg — ABNORMAL LOW (ref 26.6–33.0)
MCHC: 31.6 g/dL (ref 31.5–35.7)
MCV: 81 fL (ref 79–97)
Monocytes Absolute: 0.7 10*3/uL (ref 0.1–0.9)
Monocytes: 9 %
Neutrophils Absolute: 4.4 10*3/uL (ref 1.4–7.0)
Neutrophils: 57 %
Platelets: 382 10*3/uL (ref 150–450)
RBC: 4.6 x10E6/uL (ref 3.77–5.28)
RDW: 14.6 % (ref 11.7–15.4)
WBC: 7.6 10*3/uL (ref 3.4–10.8)

## 2023-06-15 LAB — BASIC METABOLIC PANEL
BUN/Creatinine Ratio: 16 (ref 12–28)
BUN: 12 mg/dL (ref 8–27)
CO2: 21 mmol/L (ref 20–29)
Calcium: 9.3 mg/dL (ref 8.7–10.3)
Chloride: 103 mmol/L (ref 96–106)
Creatinine, Ser: 0.77 mg/dL (ref 0.57–1.00)
Glucose: 99 mg/dL (ref 70–99)
Potassium: 4.6 mmol/L (ref 3.5–5.2)
Sodium: 139 mmol/L (ref 134–144)
eGFR: 86 mL/min/{1.73_m2} (ref >59)

## 2023-06-15 LAB — VITAMIN B12: Vitamin B-12: 806 pg/mL (ref 232–1245)

## 2023-06-16 ENCOUNTER — Encounter: Payer: Self-pay | Admitting: Family Medicine

## 2023-10-28 ENCOUNTER — Ambulatory Visit (INDEPENDENT_AMBULATORY_CARE_PROVIDER_SITE_OTHER): Payer: Self-pay | Admitting: Family Medicine

## 2023-10-28 ENCOUNTER — Encounter: Payer: Self-pay | Admitting: Family Medicine

## 2023-10-28 VITALS — BP 121/75 | HR 89 | Temp 98.5°F | Ht 65.0 in | Wt 228.0 lb

## 2023-10-28 DIAGNOSIS — J0100 Acute maxillary sinusitis, unspecified: Secondary | ICD-10-CM

## 2023-10-28 MED ORDER — DOXYCYCLINE HYCLATE 100 MG PO TABS: 100.0000 mg | ORAL_TABLET | Freq: Two times a day (BID) | ORAL | 0 refills | Status: DC

## 2023-10-28 MED ORDER — LIDOCAINE VISCOUS HCL 2 % MT SOLN: 15.0000 mL | OROMUCOSAL | 0 refills | Status: DC | PRN

## 2023-10-28 NOTE — Progress Notes (Signed)
 BP 121/75 (BP Location: Left Arm, Patient Position: Sitting, Cuff Size: Large)   Pulse 89   Temp 98.5 F (36.9 C) (Oral)   Ht 5' 5 (1.651 m)   Wt 228 lb (103.4 kg)   LMP 05/07/2009   SpO2 96%   BMI 37.94 kg/m    Subjective:    Patient ID: Vanessa Harvey, female    DOB: 05-Mar-1961, 63 y.o.   MRN: [id number]  HPI: Vanessa Harvey is a 63 y.o. female  Chief Complaint  Patient presents with   Sore Throat    Symptoms started 2 weeks ago   Nasal Congestion   Ear Pain    Right    Conjunctivitis    Pt has concerns and thinks that she may have pink eye.    UPPER RESPIRATORY TRACT INFECTION Duration: 2 weeks Worst symptom: sore throat and cough Fever: yes, last week, low grade Cough: yes Shortness of breath: no Wheezing: no Chest pain: no Chest tightness: no Chest congestion: yes Nasal congestion: yes Runny nose: yes Post nasal drip: yes Sneezing: no Sore throat: yes Swollen glands: yes Sinus pressure: no Headache: no Face pain: no Toothache: yes Ear pain: yes right Ear pressure: yes right Eyes red/itching:yes Eye drainage/crusting: yes  Vomiting: no Rash: no Fatigue: yes Sick contacts: yes Strep contacts: no  Context: worse Recurrent sinusitis: no Relief with OTC cold/cough medications: no  Treatments attempted: mucinex    Relevant past medical, surgical, family and social history reviewed and updated as indicated. Interim medical history since our last visit reviewed. Allergies and medications reviewed and updated.  Review of Systems  Constitutional:  Positive for fatigue and fever. Negative for activity change, appetite change, chills, diaphoresis and unexpected weight change.  HENT:  Positive for congestion, mouth sores, postnasal drip, rhinorrhea, sinus pressure and sore throat. Negative for dental problem, drooling, ear discharge, ear pain, facial swelling, hearing loss, nosebleeds, sinus pain, sneezing, tinnitus, trouble swallowing and voice  change.   Eyes: Negative.   Respiratory: Negative.    Cardiovascular: Negative.   Gastrointestinal: Negative.   Psychiatric/Behavioral: Negative.      Per HPI unless specifically indicated above     Objective:    BP 121/75 (BP Location: Left Arm, Patient Position: Sitting, Cuff Size: Large)   Pulse 89   Temp 98.5 F (36.9 C) (Oral)   Ht 5' 5 (1.651 m)   Wt 228 lb (103.4 kg)   LMP 05/07/2009   SpO2 96%   BMI 37.94 kg/m   Wt Readings from Last 3 Encounters:  10/28/23 228 lb (103.4 kg)  06/14/23 228 lb (103.4 kg)  11/07/22 225 lb 9.6 oz (102.3 kg)    Physical Exam Vitals and nursing note reviewed.  Constitutional:      General: She is not in acute distress.    Appearance: Normal appearance. She is well-developed. She is obese. She is not ill-appearing, toxic-appearing or diaphoretic.  HENT:     Head: Normocephalic and atraumatic.     Right Ear: Tympanic membrane, ear canal and external ear normal.     Left Ear: Tympanic membrane, ear canal and external ear normal.     Nose: Rhinorrhea present. No congestion.     Mouth/Throat:     Mouth: Mucous membranes are moist.     Pharynx: Oropharynx is clear. Posterior oropharyngeal erythema present. No oropharyngeal exudate or uvula swelling.     Tonsils: No tonsillar exudate or tonsillar abscesses.     Comments: + vesicle on posterior pharynx  Eyes:     General: No scleral icterus.       Right eye: No discharge.        Left eye: No discharge.     Extraocular Movements: Extraocular movements intact.     Conjunctiva/sclera: Conjunctivae normal.     Pupils: Pupils are equal, round, and reactive to light.    Cardiovascular:     Rate and Rhythm: Normal rate and regular rhythm.     Pulses: Normal pulses.     Heart sounds: Normal heart sounds. No murmur heard.    No friction rub. No gallop.  Pulmonary:     Effort: Pulmonary effort is normal. No respiratory distress.     Breath sounds: Normal breath sounds. No stridor. No  wheezing, rhonchi or rales.  Chest:     Chest wall: No tenderness.   Musculoskeletal:        General: Normal range of motion.     Cervical back: Normal range of motion and neck supple.   Skin:    General: Skin is warm and dry.     Capillary Refill: Capillary refill takes less than 2 seconds.     Coloration: Skin is not jaundiced or pale.     Findings: No bruising, erythema, lesion or rash.   Neurological:     General: No focal deficit present.     Mental Status: She is alert and oriented to person, place, and time. Mental status is at baseline.   Psychiatric:        Mood and Affect: Mood normal.        Behavior: Behavior normal.        Thought Content: Thought content normal.        Judgment: Judgment normal.     Results for orders placed or performed in visit on 06/14/23  CBC with Differential/Platelet   Collection Time: 06/14/23  9:09 AM  Result Value Ref Range   WBC 7.6 3.4 - 10.8 x10E3/uL   RBC 4.60 3.77 - 5.28 x10E6/uL   Hemoglobin 11.8 11.1 - 15.9 g/dL   Hematocrit 62.6 65.9 - 46.6 %   MCV 81 79 - 97 fL   MCH 25.7 (L) 26.6 - 33.0 pg   MCHC 31.6 31.5 - 35.7 g/dL   RDW 85.3 88.2 - 84.5 %   Platelets 382 150 - 450 x10E3/uL   Neutrophils 57 Not Estab. %   Lymphs 30 Not Estab. %   Monocytes 9 Not Estab. %   Eos 3 Not Estab. %   Basos 1 Not Estab. %   Neutrophils Absolute 4.4 1.4 - 7.0 x10E3/uL   Lymphocytes Absolute 2.3 0.7 - 3.1 x10E3/uL   Monocytes Absolute 0.7 0.1 - 0.9 x10E3/uL   EOS (ABSOLUTE) 0.2 0.0 - 0.4 x10E3/uL   Basophils Absolute 0.0 0.0 - 0.2 x10E3/uL   Immature Granulocytes 0 Not Estab. %   Immature Grans (Abs) 0.0 0.0 - 0.1 x10E3/uL  B12   Collection Time: 06/14/23  9:09 AM  Result Value Ref Range   Vitamin B-12 806 232 - 1,245 pg/mL  Basic metabolic panel   Collection Time: 06/14/23  9:09 AM  Result Value Ref Range   Glucose 99 70 - 99 mg/dL   BUN 12 8 - 27 mg/dL   Creatinine, Ser 9.22 0.57 - 1.00 mg/dL   eGFR 86 >40 fO/fpw/8.26    BUN/Creatinine Ratio 16 12 - 28   Sodium 139 134 - 144 mmol/L   Potassium 4.6 3.5 - 5.2 mmol/L   Chloride  103 96 - 106 mmol/L   CO2 21 20 - 29 mmol/L   Calcium 9.3 8.7 - 10.3 mg/dL      Assessment & Plan:   Problem List Items Addressed This Visit   None Visit Diagnoses       Acute non-recurrent maxillary sinusitis    -  Primary   Will treat with doxycycline . Call with any concerns or if not getting better. Continue to monitor.   Relevant Medications   doxycycline  (VIBRA -TABS) 100 MG tablet        Follow up plan: Return for As scheduled.

## 2024-01-15 DIAGNOSIS — L720 Epidermal cyst: Secondary | ICD-10-CM | POA: Diagnosis not present

## 2024-01-15 DIAGNOSIS — L718 Other rosacea: Secondary | ICD-10-CM | POA: Diagnosis not present

## 2024-01-15 DIAGNOSIS — L821 Other seborrheic keratosis: Secondary | ICD-10-CM | POA: Diagnosis not present

## 2024-02-04 ENCOUNTER — Encounter: Payer: Self-pay | Admitting: Family Medicine

## 2024-02-04 ENCOUNTER — Ambulatory Visit (INDEPENDENT_AMBULATORY_CARE_PROVIDER_SITE_OTHER): Payer: Self-pay | Admitting: Family Medicine

## 2024-02-04 VITALS — BP 113/78 | HR 79 | Temp 98.0°F | Ht 65.0 in | Wt 224.8 lb

## 2024-02-04 DIAGNOSIS — J069 Acute upper respiratory infection, unspecified: Secondary | ICD-10-CM | POA: Diagnosis not present

## 2024-02-04 DIAGNOSIS — Z131 Encounter for screening for diabetes mellitus: Secondary | ICD-10-CM | POA: Diagnosis not present

## 2024-02-04 DIAGNOSIS — E538 Deficiency of other specified B group vitamins: Secondary | ICD-10-CM | POA: Diagnosis not present

## 2024-02-04 DIAGNOSIS — Z0000 Encounter for general adult medical examination without abnormal findings: Secondary | ICD-10-CM | POA: Diagnosis not present

## 2024-02-04 DIAGNOSIS — Z1231 Encounter for screening mammogram for malignant neoplasm of breast: Secondary | ICD-10-CM | POA: Diagnosis not present

## 2024-02-04 DIAGNOSIS — F3342 Major depressive disorder, recurrent, in full remission: Secondary | ICD-10-CM

## 2024-02-04 DIAGNOSIS — R200 Anesthesia of skin: Secondary | ICD-10-CM

## 2024-02-04 DIAGNOSIS — I129 Hypertensive chronic kidney disease with stage 1 through stage 4 chronic kidney disease, or unspecified chronic kidney disease: Secondary | ICD-10-CM

## 2024-02-04 DIAGNOSIS — Z780 Asymptomatic menopausal state: Secondary | ICD-10-CM

## 2024-02-04 DIAGNOSIS — Z136 Encounter for screening for cardiovascular disorders: Secondary | ICD-10-CM | POA: Diagnosis not present

## 2024-02-04 DIAGNOSIS — N182 Chronic kidney disease, stage 2 (mild): Secondary | ICD-10-CM

## 2024-02-04 LAB — BAYER DCA HB A1C WAIVED: HB A1C (BAYER DCA - WAIVED): 5.8 % — ABNORMAL HIGH (ref 4.8–5.6)

## 2024-02-04 LAB — MICROALBUMIN, URINE WAIVED
Creatinine, Urine Waived: 10 mg/dL (ref 10–300)
Microalb, Ur Waived: 10 mg/L (ref 0–19)
Microalb/Creat Ratio: <30 mg/g (ref <30)

## 2024-02-04 MED ORDER — MONTELUKAST SODIUM 10 MG PO TABS: ORAL_TABLET | ORAL | 2 refills | Status: AC

## 2024-02-04 MED ORDER — ALBUTEROL SULFATE HFA 108 (90 BASE) MCG/ACT IN AERS: 2.0000 | INHALATION_SPRAY | RESPIRATORY_TRACT | 6 refills | Status: AC | PRN

## 2024-02-04 MED ORDER — LISINOPRIL 5 MG PO TABS: 5.0000 mg | ORAL_TABLET | Freq: Every day | ORAL | 0 refills | Status: DC

## 2024-02-04 MED ORDER — FLUOXETINE HCL 20 MG PO CAPS: 20.0000 mg | ORAL_CAPSULE | Freq: Every day | ORAL | 2 refills | Status: AC

## 2024-02-04 MED ORDER — PREDNISONE 50 MG PO TABS: 50.0000 mg | ORAL_TABLET | Freq: Every day | ORAL | 0 refills | Status: DC

## 2024-02-04 NOTE — Assessment & Plan Note (Signed)
 Rechecking labs today. Await results. Treat as needed.

## 2024-02-04 NOTE — Patient Instructions (Signed)
 Preventative Services:  AAA screening: N/A Health Risk Assessment and Personalized Prevention Plan: Done today Bone Mass Measurements: Ordered today Breast Cancer Screening: Ordered today CVD Screening: Done today Cervical Cancer Screening: Declined- will do next visit Colon Cancer Screening: up to date Depression Screening: done today Diabetes Screening: done today Glaucoma Screening: see your eye doctor Hepatitis B vaccine: N/A Hepatitis C screening: up to date HIV Screening: up to date Flu Vaccine: declined Lung cancer Screening: N/A Obesity Screening: done today Pneumonia Vaccines: declined STI Screening: N/A  Please call to schedule your mammogram and/or bone density: [facility]  Address: [address], [place], [place] [address] Phone: [phone]  [facility] [address] [place],  [place]  [address] Phone: [phone]

## 2024-02-04 NOTE — Progress Notes (Signed)
 Subjective:   Vanessa Harvey is a 63 y.o. female who presents for Medicare Annual (Subsequent) preventive examination.  Visit Complete: In person  Patient Medicare AWV questionnaire was completed by the patient on 02/04/24; I have confirmed that all information answered by patient is correct and no changes since this date.  Cardiac Risk Factors include: advanced age (>81men, >76 women);obesity (BMI >30kg/m2);family history of premature cardiovascular disease;hypertension     Objective:    Today's Vitals   02/04/24 0813  BP: 113/78  Pulse: 79  Temp: 98 F (36.7 C)  TempSrc: Oral  SpO2: 97%  Weight: 224 lb 12.8 oz (102 kg)  Height: 5' 5 (1.651 m)  PainSc: 0-No pain   Body mass index is 37.41 kg/m.     05/26/2021    6:51 AM 04/08/2017    8:03 AM 04/04/2017   10:27 AM 12/12/2016    8:20 AM 11/29/2015    9:32 AM  Advanced Directives  Does Patient Have a Medical Advance Directive? No No  No  No  No   Would patient like information on creating a medical advance directive? No - Patient declined No - Patient declined  No - Patient declined   No - patient declined information      Data saved with a previous flowsheet row definition    Current Medications (verified) Outpatient Encounter Medications as of 02/04/2024  Medication Sig   ALPRAZolam  (XANAX ) 0.5 MG tablet Take 0.5-1 tablets (0.25-0.5 mg total) by mouth 2 (two) times daily as needed for anxiety. For severe anxiety   Cyanocobalamin  (VITAMIN B-12) 1000 MCG SUBL Place 1 tablet (1,000 mcg total) under the tongue daily.   ELDERBERRY-VITAMIN C-ZINC PO Take 1 tablet by mouth daily.   Multiple Vitamin (MULTIVITAMIN) tablet Take 1 tablet by mouth daily.   predniSONE  (DELTASONE ) 50 MG tablet Take 1 tablet (50 mg total) by mouth daily with breakfast.   [DISCONTINUED] albuterol  (PROVENTIL  HFA) 108 (90 Base) MCG/ACT inhaler Inhale 2 puffs into the lungs every 4 (four) hours as needed.   [DISCONTINUED] FLUoxetine  (PROZAC ) 20 MG  capsule Take 1 capsule (20 mg total) by mouth daily.   [DISCONTINUED] lidocaine  (XYLOCAINE ) 2 % solution Use as directed 15 mLs in the mouth or throat every 4 (four) hours as needed for mouth pain.   [DISCONTINUED] lisinopril  (ZESTRIL ) 10 MG tablet Take 1 tablet (10 mg total) by mouth daily.   [DISCONTINUED] montelukast  (SINGULAIR ) 10 MG tablet TAKE 1 TABLET(10 MG) BY MOUTH DAILY   albuterol  (PROVENTIL  HFA) 108 (90 Base) MCG/ACT inhaler Inhale 2 puffs into the lungs every 4 (four) hours as needed.   FLUoxetine  (PROZAC ) 20 MG capsule Take 1 capsule (20 mg total) by mouth daily.   lisinopril  (ZESTRIL ) 5 MG tablet Take 1 tablet (5 mg total) by mouth daily.   montelukast  (SINGULAIR ) 10 MG tablet TAKE 1 TABLET(10 MG) BY MOUTH DAILY   [DISCONTINUED] doxycycline  (VIBRA -TABS) 100 MG tablet Take 1 tablet (100 mg total) by mouth 2 (two) times daily.   No facility-administered encounter medications on file as of 02/04/2024.    Allergies (verified) Penicillins   History: Past Medical History:  Diagnosis Date   Allergic rhinitis, cause unspecified    Anxiety state 12/22/2008   Qualifier: Diagnosis of  By: Randeen MD, Laine Caldron    Anxiety state, unspecified    Asthma    Breast discharge 12/12/2016   right   Degenerative disc disease, lumbar    Depressive disorder, not elsewhere classified    Family  history of stroke 12/12/2016   GERD (gastroesophageal reflux disease)    Low HDL (under 40) 10/25/2017   Lumbago    Numbness in right leg 07/06/2015   Other atopic dermatitis and related conditions    Other B-complex deficiencies    Panic disorder without agoraphobia    Problems with swallowing and mastication    Right facial numbness 11/29/2015   Submandibular lymphadenopathy 06/29/2013   Unspecified asthma(493.90)    Past Surgical History:  Procedure Laterality Date   Abd US   09/2005   Negative   CATARACT EXTRACTION  05/07/2021   CATARACT EXTRACTION  06/07/2021   CESAREAN SECTION  1993   COLONOSCOPY  WITH PROPOFOL  N/A 05/26/2021   Procedure: COLONOSCOPY WITH PROPOFOL ;  Surgeon: Jinny Carmine, MD;  Location: [facility];  Service: Endoscopy;  Laterality: N/A;   DEXA  06/2004   ESOPHAGOGASTRODUODENOSCOPY (EGD) WITH PROPOFOL  N/A 04/08/2017   Procedure: ESOPHAGOGASTRODUODENOSCOPY (EGD) WITH PROPOFOL ;  Surgeon: Jinny Carmine, MD;  Location: [facility];  Service: Endoscopy;  Laterality: N/A;   KNEE ARTHROSCOPY  1984   evulsion of femur s/p fall   Family History  Problem Relation Age of Onset   Hypertension Father        Mild   Ulcerative colitis Father    Cancer Father        esophageal and colon   Stroke Father    COPD Father    Arthritis Mother    Diabetes Brother    Diabetes Paternal Grandmother    Arthritis Brother    Heart disease Neg Hx    Breast cancer Neg Hx    Social History   Socioeconomic History   Marital status: Married    Spouse name: Not on file   Number of children: 2   Years of education: Not on file   Highest education level: Not on file  Occupational History   Occupation: Sales executive  Tobacco Use   Smoking status: Never   Smokeless tobacco: Never  Vaping Use   Vaping status: Never Used  Substance and Sexual Activity   Alcohol use: Yes    Comment: Rare   Drug use: No   Sexual activity: Yes    Birth control/protection: Post-menopausal  Other Topics Concern   Not on file  Social History Narrative   Exercise-yes   Social Drivers of Health   Financial Resource Strain: Low Risk  (02/04/2024)   Overall Financial Resource Strain (CARDIA)    Difficulty of Paying Living Expenses: Not hard at all  Food Insecurity: No Food Insecurity (02/04/2024)   Hunger Vital Sign    Worried About Running Out of Food in the Last Year: Never true    Ran Out of Food in the Last Year: Never true  Transportation Needs: No Transportation Needs (02/04/2024)   PRAPARE - Administrator, Civil Service (Medical): No    Lack of Transportation  (Non-Medical): No  Physical Activity: Sufficiently Active (02/04/2024)   Exercise Vital Sign    Days of Exercise per Week: 4 days    Minutes of Exercise per Session: 60 min  Stress: No Stress Concern Present (02/04/2024)   [employer] of Occupational Health - Occupational Stress Questionnaire    Feeling of Stress: Not at all  Social Connections: Socially Integrated (02/04/2024)   Social Connection and Isolation Panel    Frequency of Communication with Friends and Family: More than three times a week    Frequency of Social Gatherings with Friends and Family: Three times  a week    Attends Religious Services: More than 4 times per year    Active Member of Clubs or Organizations: Yes    Attends Banker Meetings: More than 4 times per year    Marital Status: Married    Tobacco Counseling Counseling given: Not Answered   Clinical Intake:     Pain Score: 0-No pain                  Activities of Daily Living    02/04/2024    8:17 AM  In your present state of health, do you have any difficulty performing the following activities:  Hearing? 0  Vision? 0  Difficulty concentrating or making decisions? 0  Walking or climbing stairs? 0  Dressing or bathing? 0  Doing errands, shopping? 0  Preparing Food and eating ? N  Using the Toilet? N  In the past six months, have you accidently leaked urine? N  Do you have problems with loss of bowel control? N  Managing your Medications? N  Managing your Finances? N  Housekeeping or managing your Housekeeping? N    Patient Care Team: Vicci Duwaine SQUIBB, DO as PCP - General (Family Medicine) Herminio Miu, MD (Otolaryngology)  Indicate any recent Medical Services you may have received from other than [facility] providers in the past year (date may be approximate).     Assessment:   This is a routine wellness examination for John.  Hearing/Vision screen Hearing Screening  Method: Audiometry   500Hz  1000Hz  2000Hz   4000Hz   Right ear 20 20 20 20   Left ear 20 20 20 20    Vision Screening   Right eye Left eye Both eyes  Without correction 20/40 20/25 20/25   With correction        Goals Addressed   None    Depression Screen    02/04/2024    8:21 AM 06/14/2023    9:02 AM 11/07/2022    8:29 AM 05/09/2022   11:24 AM 10/31/2021   11:19 AM 04/21/2021    9:29 AM 09/16/2020    9:13 AM  PHQ 2/9 Scores  PHQ - 2 Score 0 0 0 0 0 0 0  PHQ- 9 Score 1 1 0 2 0 3 2    Fall Risk    02/04/2024    8:21 AM 06/14/2023    9:02 AM 11/07/2022    8:29 AM 05/09/2022   11:23 AM 10/31/2021   11:18 AM  Fall Risk   Falls in the past year? 0 0 0 0 0  Number falls in past yr: 0 0 0 0 0  Injury with Fall? 0 0 0 0 0  Risk for fall due to : No Fall Risks No Fall Risks No Fall Risks No Fall Risks No Fall Risks  Follow up  Falls evaluation completed Falls evaluation completed Falls evaluation completed  Falls evaluation completed      Data saved with a previous flowsheet row definition    MEDICARE RISK AT HOME: Medicare Risk at Home Any stairs in or around the home?: Yes If so, are there any without handrails?: No Home free of loose throw rugs in walkways, pet beds, electrical cords, etc?: No Adequate lighting in your home to reduce risk of falls?: No Life alert?: No Use of a cane, walker or w/c?: No Grab bars in the bathroom?: No Shower chair or bench in shower?: No Elevated toilet seat or a handicapped toilet?: No  TIMED UP AND GO:  Was the test performed?  Yes  Length of time to ambulate 10 feet: 10 sec Gait steady and fast without use of assistive device    Cognitive Function:        02/04/2024    8:19 AM  6CIT Screen  What Year? 0 points  What month? 0 points  What time? 0 points  Count back from 20 0 points  Months in reverse 0 points  Repeat phrase 0 points  Total Score 0 points    Immunizations Immunization History  Administered Date(s) Administered   Influenza Split 02/05/2011    Influenza-Unspecified 02/05/2015, 02/11/2019   Td 10/24/1998, 01/07/2009   Tdap 06/26/2019    TDAP status: Up to date  Flu Vaccine status: Declined, Education has been provided regarding the importance of this vaccine but patient still declined. Advised may receive this vaccine at local pharmacy or Health Dept. Aware to provide a copy of the vaccination record if obtained from local pharmacy or Health Dept. Verbalized acceptance and understanding.  Pneumococcal vaccine status: Declined,  Education has been provided regarding the importance of this vaccine but patient still declined. Advised may receive this vaccine at local pharmacy or Health Dept. Aware to provide a copy of the vaccination record if obtained from local pharmacy or Health Dept. Verbalized acceptance and understanding.   Covid-19 vaccine status: Declined, Education has been provided regarding the importance of this vaccine but patient still declined. Advised may receive this vaccine at local pharmacy or Health Dept.or vaccine clinic. Aware to provide a copy of the vaccination record if obtained from local pharmacy or Health Dept. Verbalized acceptance and understanding.  Qualifies for Shingles Vaccine? Yes   Zostavax completed No   Shingrix Completed?: No.    Education has been provided regarding the importance of this vaccine. Patient has been advised to call insurance company to determine out of pocket expense if they have not yet received this vaccine. Advised may also receive vaccine at local pharmacy or Health Dept. Verbalized acceptance and understanding.  Screening Tests Health Maintenance  Topic Date Due   Zoster Vaccines- Shingrix (1 of 2) Never done   Mammogram  11/18/2021   Cervical Cancer Screening (HPV/Pap Cotest)  08/24/2022   DEXA SCAN  Never done   Influenza Vaccine  08/04/2024 (Originally 12/06/2023)   Pneumococcal Vaccine: 50+ Years (1 of 2 - PCV) 02/03/2025 (Originally 01/12/1980)   Medicare Annual Wellness  (AWV)  02/03/2025   Colonoscopy  05/26/2026   DTaP/Tdap/Td (4 - Td or Tdap) 06/25/2029   Hepatitis C Screening  Completed   HIV Screening  Completed   Hepatitis B Vaccines 19-59 Average Risk  Aged Out   HPV VACCINES  Aged Out   Meningococcal B Vaccine  Aged Out   COVID-19 Vaccine  Discontinued    Health Maintenance  Health Maintenance Due  Topic Date Due   Zoster Vaccines- Shingrix (1 of 2) Never done   Mammogram  11/18/2021   Cervical Cancer Screening (HPV/Pap Cotest)  08/24/2022   DEXA SCAN  Never done    Colorectal cancer screening: Type of screening: Colonoscopy. Completed 05/26/21. Repeat every 5 years  Mammogram status: Ordered today. Pt provided with contact info and advised to call to schedule appt.   Bone Density status: Ordered today. Pt provided with contact info and advised to call to schedule appt.  Lung Cancer Screening: (Low Dose CT Chest recommended if Age 50-80 years, 20 pack-year currently smoking OR have quit w/in 15years.) does not qualify.    Additional  Screening:  Hepatitis C Screening: does qualify; Completed 09/16/20  Vision Screening: Recommended annual ophthalmology exams for early detection of glaucoma and other disorders of the eye.  Dental Screening: Recommended annual dental exams for proper oral hygiene  Community Resource Referral / Chronic Care Management: CRR required this visit?  No   CCM required this visit?  No     Plan:     I have personally reviewed and noted the following in the patient's chart:   Medical and social history Use of alcohol, tobacco or illicit drugs  Current medications and supplements including opioid prescriptions. Patient is not currently taking opioid prescriptions. Functional ability and status Nutritional status Physical activity Advanced directives List of other physicians Hospitalizations, surgeries, and ER visits in previous 12 months Vitals Screenings to include cognitive, depression, and  falls Referrals and appointments  In addition, I have reviewed and discussed with patient certain preventive protocols, quality metrics, and best practice recommendations. A written personalized care plan for preventive services as well as general preventive health recommendations were provided to patient.     Duwaine Louder, DO   02/04/2024   After Visit Summary: (In Person-Printed) AVS printed and given to the patient

## 2024-02-04 NOTE — Assessment & Plan Note (Signed)
 Under good control on current regimen. Continue current regimen. Continue to monitor. Call with any concerns. Refills given.

## 2024-02-04 NOTE — Progress Notes (Signed)
 BP 113/78 (BP Location: Left Arm, Patient Position: Sitting)   Pulse 79   Temp 98 F (36.7 C) (Oral)   Ht 5' 5 (1.651 m)   Wt 224 lb 12.8 oz (102 kg)   LMP 05/07/2009   SpO2 97%   BMI 37.41 kg/m    Subjective:    Patient ID: Vanessa Harvey, female    DOB: 1960-12-11, 63 y.o.   MRN: [id number]  HPI: Vanessa Harvey is a 63 y.o. female presenting on 02/04/2024 for comprehensive medical examination. Current medical complaints include:  HYPERTENSION- would like to come off her blood pressure medicine. Has been doing well with diet and exercise  Hypertension status: controlled  Satisfied with current treatment? no Duration of hypertension: chronic BP monitoring frequency:  rarely BP medication side effects:  no Medication compliance: excellent compliance Previous BP meds: lisinopril  Aspirin: no Recurrent headaches: no Visual changes: no Palpitations: no Dyspnea: no Chest pain: no Lower extremity edema: no Dizzy/lightheaded: no  NUMBNESS Duration: weeks Onset: gradual Location: L lateral leg Bilateral: no Symmetric: no Decreased sensation: yes  Weakness: no Pain: no Quality:  numb and tingling Severity: moderate  Frequency: intermittent Trauma: no Recent illness: no Diabetes: no Thyroid disease: no  HIV: no  Alcoholism: no  Spinal cord injury: no Status: worse  DEPRESSION Mood status: controlled Satisfied with current treatment?: yes Symptom severity: mild  Duration of current treatment : chronic Side effects: no Medication compliance: excellent compliance Psychotherapy/counseling: no  Previous psychiatric medications: prozac  Depressed mood: yes Anxious mood: no Anhedonia: no Significant weight loss or gain: no Insomnia: no  Fatigue: yes Feelings of worthlessness or guilt: no Impaired concentration/indecisiveness: no Suicidal ideations: no Hopelessness: no Crying spells: no    02/04/2024    8:21 AM 06/14/2023    9:02 AM 11/07/2022    8:29 AM  05/09/2022   11:24 AM 10/31/2021   11:19 AM  Depression screen PHQ 2/9  Decreased Interest 0 0 0 0 0  Down, Depressed, Hopeless 0 0 0 0 0  PHQ - 2 Score 0 0 0 0 0  Altered sleeping 0 0 0 0 0  Tired, decreased energy 1 1 0 1 0  Change in appetite 0 0 0 1   Feeling bad or failure about yourself  0 0 0 0 0  Trouble concentrating 0 0 0 0 0  Moving slowly or fidgety/restless 0 0 0 0 0  Suicidal thoughts 0 0 0 0 0  PHQ-9 Score 1 1 0 2 0  Difficult doing work/chores Not difficult at all Not difficult at all Not difficult at all Not difficult at all Not difficult at all   Menopausal Symptoms: no  Functional Status Survey: Is the patient deaf or have difficulty hearing?: No Does the patient have difficulty seeing, even when wearing glasses/contacts?: No Does the patient have difficulty concentrating, remembering, or making decisions?: No Does the patient have difficulty walking or climbing stairs?: No Does the patient have difficulty dressing or bathing?: No Does the patient have difficulty doing errands alone such as visiting a doctor's office or shopping?: No     02/04/2024    8:21 AM 06/14/2023    9:02 AM 11/07/2022    8:29 AM 05/09/2022   11:23 AM 10/31/2021   11:18 AM  Fall Risk   Falls in the past year? 0 0 0 0 0  Number falls in past yr: 0 0 0 0 0  Injury with Fall? 0 0 0 0 0  Risk  for fall due to : No Fall Risks No Fall Risks No Fall Risks No Fall Risks No Fall Risks  Follow up  Falls evaluation completed Falls evaluation completed Falls evaluation completed  Falls evaluation completed      Data saved with a previous flowsheet row definition    Depression Screen    02/04/2024    8:21 AM 06/14/2023    9:02 AM 11/07/2022    8:29 AM 05/09/2022   11:24 AM 10/31/2021   11:19 AM  Depression screen PHQ 2/9  Decreased Interest 0 0 0 0 0  Down, Depressed, Hopeless 0 0 0 0 0  PHQ - 2 Score 0 0 0 0 0  Altered sleeping 0 0 0 0 0  Tired, decreased energy 1 1 0 1 0  Change in appetite 0 0 0 1    Feeling bad or failure about yourself  0 0 0 0 0  Trouble concentrating 0 0 0 0 0  Moving slowly or fidgety/restless 0 0 0 0 0  Suicidal thoughts 0 0 0 0 0  PHQ-9 Score 1 1 0 2 0  Difficult doing work/chores Not difficult at all Not difficult at all Not difficult at all Not difficult at all Not difficult at all     Advanced Directives Does patient have a HCPOA?    no If yes, name and contact information:  Does patient have a living will or MOST form?  no  Past Medical History:  Past Medical History:  Diagnosis Date   Allergic rhinitis, cause unspecified    Anxiety state 12/22/2008   Qualifier: Diagnosis of  By: Randeen MD, Laine Caldron    Anxiety state, unspecified    Asthma    Breast discharge 12/12/2016   right   Degenerative disc disease, lumbar    Depressive disorder, not elsewhere classified    Family history of stroke 12/12/2016   GERD (gastroesophageal reflux disease)    Low HDL (under 40) 10/25/2017   Lumbago    Numbness in right leg 07/06/2015   Other atopic dermatitis and related conditions    Other B-complex deficiencies    Panic disorder without agoraphobia    Problems with swallowing and mastication    Right facial numbness 11/29/2015   Submandibular lymphadenopathy 06/29/2013   Unspecified asthma(493.90)     Surgical History:  Past Surgical History:  Procedure Laterality Date   Abd US   09/2005   Negative   CATARACT EXTRACTION  05/07/2021   CATARACT EXTRACTION  06/07/2021   CESAREAN SECTION  1993   COLONOSCOPY WITH PROPOFOL  N/A 05/26/2021   Procedure: COLONOSCOPY WITH PROPOFOL ;  Surgeon: Jinny Carmine, MD;  Location: [facility];  Service: Endoscopy;  Laterality: N/A;   DEXA  06/2004   ESOPHAGOGASTRODUODENOSCOPY (EGD) WITH PROPOFOL  N/A 04/08/2017   Procedure: ESOPHAGOGASTRODUODENOSCOPY (EGD) WITH PROPOFOL ;  Surgeon: Jinny Carmine, MD;  Location: [facility];  Service: Endoscopy;  Laterality: N/A;   KNEE ARTHROSCOPY  1984   evulsion of femur s/p  fall    Medications:  Current Outpatient Medications on File Prior to Visit  Medication Sig   ALPRAZolam  (XANAX ) 0.5 MG tablet Take 0.5-1 tablets (0.25-0.5 mg total) by mouth 2 (two) times daily as needed for anxiety. For severe anxiety   Cyanocobalamin  (VITAMIN B-12) 1000 MCG SUBL Place 1 tablet (1,000 mcg total) under the tongue daily.   ELDERBERRY-VITAMIN C-ZINC PO Take 1 tablet by mouth daily.   Multiple Vitamin (MULTIVITAMIN) tablet Take 1 tablet by mouth daily.   No  current facility-administered medications on file prior to visit.    Allergies:  Allergies  Allergen Reactions   Penicillins     Has patient had a PCN reaction causing immediate rash, facial/tongue/throat swelling, SOB or lightheadedness with hypotension: No Has patient had a PCN reaction causing severe rash involving mucus membranes or skin necrosis: Unknown Has patient had a PCN reaction that required hospitalization: Unknown Has patient had a PCN reaction occurring within the last 10 years: Unknown If all of the above answers are NO, then may proceed with Cephalosporin use.     Social History:  Social History   Socioeconomic History   Marital status: Married    Spouse name: Not on file   Number of children: 2   Years of education: Not on file   Highest education level: Not on file  Occupational History   Occupation: Sales executive  Tobacco Use   Smoking status: Never   Smokeless tobacco: Never  Vaping Use   Vaping status: Never Used  Substance and Sexual Activity   Alcohol use: Yes    Comment: Rare   Drug use: No   Sexual activity: Yes    Birth control/protection: Post-menopausal  Other Topics Concern   Not on file  Social History Narrative   Exercise-yes   Social Drivers of Health   Financial Resource Strain: Low Risk  (02/04/2024)   Overall Financial Resource Strain (CARDIA)    Difficulty of Paying Living Expenses: Not hard at all  Food Insecurity: No Food Insecurity (02/04/2024)    Hunger Vital Sign    Worried About Running Out of Food in the Last Year: Never true    Ran Out of Food in the Last Year: Never true  Transportation Needs: No Transportation Needs (02/04/2024)   PRAPARE - Administrator, Civil Service (Medical): No    Lack of Transportation (Non-Medical): No  Physical Activity: Sufficiently Active (02/04/2024)   Exercise Vital Sign    Days of Exercise per Week: 4 days    Minutes of Exercise per Session: 60 min  Stress: No Stress Concern Present (02/04/2024)   [employer] of Occupational Health - Occupational Stress Questionnaire    Feeling of Stress: Not at all  Social Connections: Socially Integrated (02/04/2024)   Social Connection and Isolation Panel    Frequency of Communication with Friends and Family: More than three times a week    Frequency of Social Gatherings with Friends and Family: Three times a week    Attends Religious Services: More than 4 times per year    Active Member of Clubs or Organizations: Yes    Attends Banker Meetings: More than 4 times per year    Marital Status: Married  Catering manager Violence: Not At Risk (02/04/2024)   Humiliation, Afraid, Rape, and Kick questionnaire    Fear of Current or Ex-Partner: No    Emotionally Abused: No    Physically Abused: No    Sexually Abused: No   Social History   Tobacco Use  Smoking Status Never  Smokeless Tobacco Never   Social History   Substance and Sexual Activity  Alcohol Use Yes   Comment: Rare    Family History:  Family History  Problem Relation Age of Onset   Hypertension Father        Mild   Ulcerative colitis Father    Cancer Father        esophageal and colon   Stroke Father    COPD Father  Arthritis Mother    Diabetes Brother    Diabetes Paternal Grandmother    Arthritis Brother    Heart disease Neg Hx    Breast cancer Neg Hx     Past medical history, surgical history, medications, allergies, family history and social  history reviewed with patient today and changes made to appropriate areas of the chart.   Review of Systems  Constitutional: Negative.   HENT:  Positive for congestion. Negative for ear discharge, ear pain, hearing loss, nosebleeds, sinus pain, sore throat and tinnitus.   Eyes: Negative.   Respiratory:  Positive for cough. Negative for hemoptysis, sputum production, shortness of breath, wheezing and stridor.   Cardiovascular:  Positive for palpitations. Negative for chest pain, orthopnea, claudication, leg swelling and PND.  Gastrointestinal:  Positive for heartburn (with food choices). Negative for abdominal pain, blood in stool, constipation, diarrhea, melena, nausea and vomiting.  Genitourinary: Negative.   Musculoskeletal: Negative.   Skin: Negative.   Neurological:  Positive for tingling. Negative for dizziness, tremors, sensory change, speech change, focal weakness, seizures, loss of consciousness, weakness and headaches.  Endo/Heme/Allergies:  Positive for environmental allergies. Negative for polydipsia. Does not bruise/bleed easily.  Psychiatric/Behavioral: Negative.      All other ROS negative except what is listed above and in the HPI.      Objective:    BP 113/78 (BP Location: Left Arm, Patient Position: Sitting)   Pulse 79   Temp 98 F (36.7 C) (Oral)   Ht 5' 5 (1.651 m)   Wt 224 lb 12.8 oz (102 kg)   LMP 05/07/2009   SpO2 97%   BMI 37.41 kg/m   Wt Readings from Last 3 Encounters:  02/04/24 224 lb 12.8 oz (102 kg)  10/28/23 228 lb (103.4 kg)  06/14/23 228 lb (103.4 kg)    Hearing Screening  Method: Audiometry   500Hz  1000Hz  2000Hz  4000Hz   Right ear 20 20 20 20   Left ear 20 20 20 20    Vision Screening   Right eye Left eye Both eyes  Without correction 20/40 20/25 20/25   With correction       Physical Exam Vitals and nursing note reviewed.  Constitutional:      General: She is not in acute distress.    Appearance: Normal appearance. She is obese. She  is not ill-appearing, toxic-appearing or diaphoretic.  HENT:     Head: Normocephalic and atraumatic.     Right Ear: Tympanic membrane, ear canal and external ear normal. There is no impacted cerumen.     Left Ear: Tympanic membrane, ear canal and external ear normal. There is no impacted cerumen.     Nose: Nose normal. No congestion or rhinorrhea.     Mouth/Throat:     Mouth: Mucous membranes are moist.     Pharynx: Oropharynx is clear. No oropharyngeal exudate or posterior oropharyngeal erythema.  Eyes:     General: No scleral icterus.       Right eye: No discharge.        Left eye: No discharge.     Extraocular Movements: Extraocular movements intact.     Conjunctiva/sclera: Conjunctivae normal.     Pupils: Pupils are equal, round, and reactive to light.  Neck:     Vascular: No carotid bruit.  Cardiovascular:     Rate and Rhythm: Normal rate and regular rhythm.     Pulses: Normal pulses.     Heart sounds: No murmur heard.    No friction rub. No gallop.  Pulmonary:     Effort: Pulmonary effort is normal. No respiratory distress.     Breath sounds: Normal breath sounds. No stridor. No wheezing, rhonchi or rales.  Chest:     Chest wall: No tenderness.  Abdominal:     General: Abdomen is flat. Bowel sounds are normal. There is no distension.     Palpations: Abdomen is soft. There is no mass.     Tenderness: There is no abdominal tenderness. There is no right CVA tenderness, left CVA tenderness, guarding or rebound.     Hernia: No hernia is present.  Genitourinary:    Comments: Breast and pelvic exams deferred with shared decision making Musculoskeletal:        General: No swelling, tenderness, deformity or signs of injury.     Cervical back: Normal range of motion and neck supple. No rigidity. No muscular tenderness.     Right lower leg: No edema.     Left lower leg: No edema.  Lymphadenopathy:     Cervical: No cervical adenopathy.  Skin:    General: Skin is warm and dry.      Capillary Refill: Capillary refill takes less than 2 seconds.     Coloration: Skin is not jaundiced or pale.     Findings: No bruising, erythema, lesion or rash.  Neurological:     General: No focal deficit present.     Mental Status: She is alert and oriented to person, place, and time. Mental status is at baseline.     Cranial Nerves: No cranial nerve deficit.     Sensory: No sensory deficit.     Motor: No weakness.     Coordination: Coordination normal.     Gait: Gait normal.     Deep Tendon Reflexes: Reflexes normal.  Psychiatric:        Mood and Affect: Mood normal.        Behavior: Behavior normal.        Thought Content: Thought content normal.        Judgment: Judgment normal.        02/04/2024    8:19 AM  6CIT Screen  What Year? 0 points  What month? 0 points  What time? 0 points  Count back from 20 0 points  Months in reverse 0 points  Repeat phrase 0 points  Total Score 0 points    Results for orders placed or performed in visit on 02/04/24  Microalbumin, Urine Waived   Collection Time: 02/04/24  8:37 AM  Result Value Ref Range   Microalb, Ur Waived 10 0 - 19 mg/L   Creatinine, Urine Waived 10 10 - 300 mg/dL   Microalb/Creat Ratio <30 <30 mg/g  Bayer DCA Hb A1c Waived   Collection Time: 02/04/24  8:37 AM  Result Value Ref Range   HB A1C (BAYER DCA - WAIVED) 5.8 (H) 4.8 - 5.6 %      Assessment & Plan:   Problem List Items Addressed This Visit       Genitourinary   Benign hypertensive renal disease   Doing great! Will cut her lisinopril  to 5mg  and recheck in 3 months with goal of continuing to cut back on her medicine.       Relevant Orders   Comprehensive metabolic panel with GFR   TSH   Microalbumin, Urine Waived (Completed)   CKD (chronic kidney disease) stage 2, GFR 60-89 ml/min   Rechecking labs today. Await results. Treat as needed.  Other   B12 deficiency   Rechecking labs today. Await results. Treat as needed.        Relevant Orders   CBC with Differential/Platelet   B12   Major depression, recurrent, full remission   Under good control on current regimen. Continue current regimen. Continue to monitor. Call with any concerns. Refills given.        Relevant Medications   FLUoxetine  (PROZAC ) 20 MG capsule   Other Visit Diagnoses       Welcome to Medicare preventive visit    -  Primary   Vaccines up to date/declined. Screening labs checked today. Pap declined- will do next visit. DEXA and mammo ordered. Colonoscopy up to date. Cont diet/exercise     Screening for cardiovascular condition       EKG normal. Will check labs. Await results.   Relevant Orders   Lipid Panel w/o Chol/HDL Ratio   EKG 12-Lead     Screening for diabetes mellitus       A1c came back at 5.8. Continue to monitor.   Relevant Orders   Bayer DCA Hb A1c Waived (Completed)     Encounter for screening mammogram for malignant neoplasm of breast       Mammogram ordered today.   Relevant Orders   MM 3D SCREENING MAMMOGRAM BILATERAL BREAST     Postmenopausal estrogen deficiency       DEXA ordered today.   Relevant Orders   DG Bone Density     Upper respiratory tract infection, unspecified type       Will treat with prednisone  burst. Call if not getting better or getting worse.   Relevant Medications   albuterol  (PROVENTIL  HFA) 108 (90 Base) MCG/ACT inhaler     Left leg numbness       Concern for radiculopathy- will check x-rays. Await results.   Relevant Orders   DG Lumbar Spine Complete   DG Hip Unilat W OR W/O Pelvis 2-3 Views Left        Preventative Services:  AAA screening: N/A Health Risk Assessment and Personalized Prevention Plan: Done today Bone Mass Measurements: Ordered today Breast Cancer Screening: Ordered today CVD Screening: Done today Cervical Cancer Screening: Declined- will do next visit Colon Cancer Screening: up to date Depression Screening: done today Diabetes Screening: done today Glaucoma  Screening: see your eye doctor Hepatitis B vaccine: N/A Hepatitis C screening: up to date HIV Screening: up to date Flu Vaccine: declined Lung cancer Screening: N/A Obesity Screening: done today Pneumonia Vaccines: declined STI Screening: N/A  Follow up plan: Return in about 3 months (around 05/05/2024) for BP follow up and pap.   LABORATORY TESTING:  - Pap smear: declined- will do next time  IMMUNIZATIONS:   - Tdap: Tetanus vaccination status reviewed: last tetanus booster within 10 years. - Influenza: Refused - Pneumovax: Refused - Prevnar: Refused - Zostavax vaccine: Given elsewhere  SCREENING: -Mammogram: Ordered today  - Colonoscopy: Up to date  - Bone Density: Ordered today  -Hearing Test: Up to date    PATIENT COUNSELING:   Advised to take 1 mg of folate supplement per day if capable of pregnancy.   Sexuality: Discussed sexually transmitted diseases, partner selection, use of condoms, avoidance of unintended pregnancy  and contraceptive alternatives.   Advised to avoid cigarette smoking.  I discussed with the patient that most people either abstain from alcohol or drink within safe limits (<=14/week and <=4 drinks/occasion for males, <=7/weeks and <= 3 drinks/occasion for females) and that the  risk for alcohol disorders and other health effects rises proportionally with the number of drinks per week and how often a drinker exceeds daily limits.  Discussed cessation/primary prevention of drug use and availability of treatment for abuse.   Diet: Encouraged to adjust caloric intake to maintain  or achieve ideal body weight, to reduce intake of dietary saturated fat and total fat, to limit sodium intake by avoiding high sodium foods and not adding table salt, and to maintain adequate dietary potassium and calcium preferably from fresh fruits, vegetables, and low-fat dairy products.    stressed the importance of regular exercise  Injury prevention: Discussed safety  belts, safety helmets, smoke detector, smoking near bedding or upholstery.   Dental health: Discussed importance of regular tooth brushing, flossing, and dental visits.    NEXT PREVENTATIVE PHYSICAL DUE IN 1 YEAR. Return in about 3 months (around 05/05/2024) for BP follow up and pap.

## 2024-02-04 NOTE — Assessment & Plan Note (Signed)
 Doing great! Will cut her lisinopril  to 5mg  and recheck in 3 months with goal of continuing to cut back on her medicine.

## 2024-02-05 LAB — LIPID PANEL W/O CHOL/HDL RATIO
Cholesterol, Total: 166 mg/dL (ref 100–199)
HDL: 42 mg/dL (ref >39)
LDL Chol Calc (NIH): 104 mg/dL — ABNORMAL HIGH (ref 0–99)
Triglycerides: 111 mg/dL (ref 0–149)
VLDL Cholesterol Cal: 20 mg/dL (ref 5–40)

## 2024-02-05 LAB — CBC WITH DIFFERENTIAL/PLATELET
Basophils Absolute: 0 x10E3/uL (ref 0.0–0.2)
Basos: 0 %
EOS (ABSOLUTE): 0.3 x10E3/uL (ref 0.0–0.4)
Eos: 3 %
Hematocrit: 39.4 % (ref 34.0–46.6)
Hemoglobin: 11.9 g/dL (ref 11.1–15.9)
Immature Grans (Abs): 0 x10E3/uL (ref 0.0–0.1)
Immature Granulocytes: 0 %
Lymphocytes Absolute: 2.2 x10E3/uL (ref 0.7–3.1)
Lymphs: 23 %
MCH: 25.1 pg — ABNORMAL LOW (ref 26.6–33.0)
MCHC: 30.2 g/dL — ABNORMAL LOW (ref 31.5–35.7)
MCV: 83 fL (ref 79–97)
Monocytes Absolute: 0.9 x10E3/uL (ref 0.1–0.9)
Monocytes: 9 %
Neutrophils Absolute: 6.3 x10E3/uL (ref 1.4–7.0)
Neutrophils: 65 %
Platelets: 410 x10E3/uL (ref 150–450)
RBC: 4.74 x10E6/uL (ref 3.77–5.28)
RDW: 14.4 % (ref 11.7–15.4)
WBC: 9.7 x10E3/uL (ref 3.4–10.8)

## 2024-02-05 LAB — COMPREHENSIVE METABOLIC PANEL WITH GFR
ALT: 16 IU/L (ref 0–32)
AST: 21 IU/L (ref 0–40)
Albumin: 4.4 g/dL (ref 3.9–4.9)
Alkaline Phosphatase: 103 IU/L (ref 49–135)
BUN/Creatinine Ratio: 13 (ref 12–28)
BUN: 11 mg/dL (ref 8–27)
Bilirubin Total: 0.3 mg/dL (ref 0.0–1.2)
CO2: 22 mmol/L (ref 20–29)
Calcium: 9.5 mg/dL (ref 8.7–10.3)
Chloride: 98 mmol/L (ref 96–106)
Creatinine, Ser: 0.86 mg/dL (ref 0.57–1.00)
Globulin, Total: 2.7 g/dL (ref 1.5–4.5)
Glucose: 96 mg/dL (ref 70–99)
Potassium: 4.3 mmol/L (ref 3.5–5.2)
Sodium: 137 mmol/L (ref 134–144)
Total Protein: 7.1 g/dL (ref 6.0–8.5)
eGFR: 75 mL/min/1.73 (ref >59)

## 2024-02-05 LAB — TSH: TSH: 1.55 u[IU]/mL (ref 0.450–4.500)

## 2024-02-05 LAB — VITAMIN B12: Vitamin B-12: 853 pg/mL (ref 232–1245)

## 2024-02-07 ENCOUNTER — Ambulatory Visit: Payer: Self-pay | Admitting: Family Medicine

## 2024-03-09 DIAGNOSIS — K080 Exfoliation of teeth due to systemic causes: Secondary | ICD-10-CM | POA: Diagnosis not present

## 2024-04-17 ENCOUNTER — Encounter: Payer: Self-pay | Admitting: Family Medicine

## 2024-04-21 ENCOUNTER — Other Ambulatory Visit

## 2024-04-21 ENCOUNTER — Encounter

## 2024-04-30 ENCOUNTER — Other Ambulatory Visit: Payer: Self-pay | Admitting: Family Medicine

## 2024-05-01 NOTE — Telephone Encounter (Signed)
 Requested Prescriptions  Pending Prescriptions Disp Refills   lisinopril  (ZESTRIL ) 5 MG tablet [Pharmacy Med Name: LISINOPRIL  5 MG TABLET] 90 tablet 0    Sig: TAKE 1 TABLET BY MOUTH DAILY     Cardiovascular:  ACE Inhibitors Passed - 05/01/2024  6:08 PM      Passed - Cr in normal range and within 180 days    Creat  Date Value Ref Range Status  10/18/2017 0.82 0.50 - 1.05 mg/dL Final    Comment:    For patients >65 years of age, the reference limit for Creatinine is approximately 13% higher for people identified as African-American. .    Creatinine, Ser  Date Value Ref Range Status  02/04/2024 0.86 0.57 - 1.00 mg/dL Final         Passed - K in normal range and within 180 days    Potassium  Date Value Ref Range Status  02/04/2024 4.3 3.5 - 5.2 mmol/L Final         Passed - Patient is not pregnant      Passed - Last BP in normal range    BP Readings from Last 1 Encounters:  02/04/24 113/78         Passed - Valid encounter within last 6 months    Recent Outpatient Visits           2 months ago Welcome to [employer] preventive visit   [place] [facility] [place], [place] P, DO   6 months ago Acute non-recurrent maxillary sinusitis   [place] [facility] [place], Megan P, DO   10 months ago B12 deficiency   [facility] [place], [place], DO

## 2024-05-05 ENCOUNTER — Ambulatory Visit: Admitting: Family Medicine

## 2024-05-15 ENCOUNTER — Encounter: Payer: Self-pay | Admitting: Family Medicine

## 2024-05-15 ENCOUNTER — Ambulatory Visit (INDEPENDENT_AMBULATORY_CARE_PROVIDER_SITE_OTHER): Admitting: Family Medicine

## 2024-05-15 VITALS — BP 123/70 | HR 88 | Temp 98.9°F | Wt 226.0 lb

## 2024-05-15 DIAGNOSIS — I129 Hypertensive chronic kidney disease with stage 1 through stage 4 chronic kidney disease, or unspecified chronic kidney disease: Secondary | ICD-10-CM

## 2024-05-15 NOTE — Patient Instructions (Signed)
 Please call to schedule your mammogram and/or bone density: [facility] at [facility]  Address: [address], [place], [place] [address] Phone: [phone]  [facility] [address] [place],  [place]  [address] Phone: [phone]

## 2024-05-15 NOTE — Assessment & Plan Note (Signed)
 Doing well on lower dose of lisinopril . Will stop it and recheck in 3 months. Continue to monitor closely. Call with any concerns.

## 2024-05-15 NOTE — Progress Notes (Signed)
 "  BP 123/70   Pulse 88   Temp 98.9 F (37.2 C) (Oral)   Wt 226 lb (102.5 kg)   LMP 05/07/2009   SpO2 96%   BMI 37.61 kg/m    Subjective:    Patient ID: Vanessa Harvey, female    DOB: 1960/05/21, 64 y.o.   MRN: [id number]  HPI: Vanessa Harvey is a 64 y.o. female  Chief Complaint  Patient presents with   Hypertension   HYPERTENSION  Hypertension status: controlled  Satisfied with current treatment? yes Duration of hypertension: chronic BP monitoring frequency:  a few times a month BP range: 118-140s/60-80 BP medication side effects:  no Medication compliance: excellent compliance Previous BP meds:lisinopril  Aspirin: no Recurrent headaches: no Visual changes: no Palpitations: yes Dyspnea: no Chest pain: no Lower extremity edema: no Dizzy/lightheaded: no   Relevant past medical, surgical, family and social history reviewed and updated as indicated. Interim medical history since our last visit reviewed. Allergies and medications reviewed and updated.  Review of Systems  Constitutional: Negative.   Respiratory: Negative.    Cardiovascular: Negative.   Musculoskeletal: Negative.   Neurological: Negative.   Psychiatric/Behavioral: Negative.      Per HPI unless specifically indicated above     Objective:    BP 123/70   Pulse 88   Temp 98.9 F (37.2 C) (Oral)   Wt 226 lb (102.5 kg)   LMP 05/07/2009   SpO2 96%   BMI 37.61 kg/m   Wt Readings from Last 3 Encounters:  05/15/24 226 lb (102.5 kg)  02/04/24 224 lb 12.8 oz (102 kg)  10/28/23 228 lb (103.4 kg)    Physical Exam Vitals and nursing note reviewed.  Constitutional:      General: She is not in acute distress.    Appearance: Normal appearance. She is not ill-appearing, toxic-appearing or diaphoretic.  HENT:     Head: Normocephalic and atraumatic.     Right Ear: External ear normal.     Left Ear: External ear normal.     Nose: Nose normal.     Mouth/Throat:     Mouth: Mucous membranes are  moist.     Pharynx: Oropharynx is clear.  Eyes:     General: No scleral icterus.       Right eye: No discharge.        Left eye: No discharge.     Extraocular Movements: Extraocular movements intact.     Conjunctiva/sclera: Conjunctivae normal.     Pupils: Pupils are equal, round, and reactive to light.  Cardiovascular:     Rate and Rhythm: Normal rate and regular rhythm.     Pulses: Normal pulses.     Heart sounds: Normal heart sounds. No murmur heard.    No friction rub. No gallop.  Pulmonary:     Effort: Pulmonary effort is normal. No respiratory distress.     Breath sounds: Normal breath sounds. No stridor. No wheezing, rhonchi or rales.  Chest:     Chest wall: No tenderness.  Musculoskeletal:        General: Normal range of motion.     Cervical back: Normal range of motion and neck supple.  Skin:    General: Skin is warm and dry.     Capillary Refill: Capillary refill takes less than 2 seconds.     Coloration: Skin is not jaundiced or pale.     Findings: No bruising, erythema, lesion or rash.  Neurological:     General: No focal deficit  present.     Mental Status: She is alert and oriented to person, place, and time. Mental status is at baseline.  Psychiatric:        Mood and Affect: Mood normal.        Behavior: Behavior normal.        Thought Content: Thought content normal.        Judgment: Judgment normal.     Results for orders placed or performed in visit on 02/04/24  Microalbumin, Urine Waived   Collection Time: 02/04/24  8:37 AM  Result Value Ref Range   Microalb, Ur Waived 10 0 - 19 mg/L   Creatinine, Urine Waived 10 10 - 300 mg/dL   Microalb/Creat Ratio <30 <30 mg/g  Bayer DCA Hb A1c Waived   Collection Time: 02/04/24  8:37 AM  Result Value Ref Range   HB A1C (BAYER DCA - WAIVED) 5.8 (H) 4.8 - 5.6 %  CBC with Differential/Platelet   Collection Time: 02/04/24  8:38 AM  Result Value Ref Range   WBC 9.7 3.4 - 10.8 x10E3/uL   RBC 4.74 3.77 - 5.28  x10E6/uL   Hemoglobin 11.9 11.1 - 15.9 g/dL   Hematocrit 60.5 65.9 - 46.6 %   MCV 83 79 - 97 fL   MCH 25.1 (L) 26.6 - 33.0 pg   MCHC 30.2 (L) 31.5 - 35.7 g/dL   RDW 85.5 88.2 - 84.5 %   Platelets 410 150 - 450 x10E3/uL   Neutrophils 65 Not Estab. %   Lymphs 23 Not Estab. %   Monocytes 9 Not Estab. %   Eos 3 Not Estab. %   Basos 0 Not Estab. %   Neutrophils Absolute 6.3 1.4 - 7.0 x10E3/uL   Lymphocytes Absolute 2.2 0.7 - 3.1 x10E3/uL   Monocytes Absolute 0.9 0.1 - 0.9 x10E3/uL   EOS (ABSOLUTE) 0.3 0.0 - 0.4 x10E3/uL   Basophils Absolute 0.0 0.0 - 0.2 x10E3/uL   Immature Granulocytes 0 Not Estab. %   Immature Grans (Abs) 0.0 0.0 - 0.1 x10E3/uL  Comprehensive metabolic panel with GFR   Collection Time: 02/04/24  8:38 AM  Result Value Ref Range   Glucose 96 70 - 99 mg/dL   BUN 11 8 - 27 mg/dL   Creatinine, Ser 9.13 0.57 - 1.00 mg/dL   eGFR 75 >40 fO/fpw/8.26   BUN/Creatinine Ratio 13 12 - 28   Sodium 137 134 - 144 mmol/L   Potassium 4.3 3.5 - 5.2 mmol/L   Chloride 98 96 - 106 mmol/L   CO2 22 20 - 29 mmol/L   Calcium 9.5 8.7 - 10.3 mg/dL   Total Protein 7.1 6.0 - 8.5 g/dL   Albumin 4.4 3.9 - 4.9 g/dL   Globulin, Total 2.7 1.5 - 4.5 g/dL   Bilirubin Total 0.3 0.0 - 1.2 mg/dL   Alkaline Phosphatase 103 49 - 135 IU/L   AST 21 0 - 40 IU/L   ALT 16 0 - 32 IU/L  Lipid Panel w/o Chol/HDL Ratio   Collection Time: 02/04/24  8:38 AM  Result Value Ref Range   Cholesterol, Total 166 100 - 199 mg/dL   Triglycerides 888 0 - 149 mg/dL   HDL 42 >60 mg/dL   VLDL Cholesterol Cal 20 5 - 40 mg/dL   LDL Chol Calc (NIH) 895 (H) 0 - 99 mg/dL  TSH   Collection Time: 02/04/24  8:38 AM  Result Value Ref Range   TSH 1.550 0.450 - 4.500 uIU/mL  B12   Collection Time:  02/04/24  8:38 AM  Result Value Ref Range   Vitamin B-12 853 232 - 1,245 pg/mL      Assessment & Plan:   Problem List Items Addressed This Visit       Genitourinary   Benign hypertensive renal disease - Primary   Doing  well on lower dose of lisinopril . Will stop it and recheck in 3 months. Continue to monitor closely. Call with any concerns.         Follow up plan: Return in about 3 months (around 08/13/2024).      "

## 2024-08-07 ENCOUNTER — Ambulatory Visit: Admitting: Family Medicine
